# Patient Record
Sex: Male | Born: 1965 | Race: White | Hispanic: No | Marital: Married | Smoking: Current every day smoker
Health system: Southern US, Community
[De-identification: ages and names within clinical notes are randomized; demographics above are authoritative.]

## PROBLEM LIST (undated history)

## (undated) DIAGNOSIS — S82409A Unspecified fracture of shaft of unspecified fibula, initial encounter for closed fracture: Secondary | ICD-10-CM

## (undated) DIAGNOSIS — E785 Hyperlipidemia, unspecified: Secondary | ICD-10-CM

## (undated) DIAGNOSIS — F17200 Nicotine dependence, unspecified, uncomplicated: Secondary | ICD-10-CM

## (undated) DIAGNOSIS — F1011 Alcohol abuse, in remission: Secondary | ICD-10-CM

## (undated) DIAGNOSIS — F32A Depression, unspecified: Secondary | ICD-10-CM

## (undated) DIAGNOSIS — F329 Major depressive disorder, single episode, unspecified: Secondary | ICD-10-CM

## (undated) DIAGNOSIS — F419 Anxiety disorder, unspecified: Secondary | ICD-10-CM

## (undated) DIAGNOSIS — F1910 Other psychoactive substance abuse, uncomplicated: Secondary | ICD-10-CM

## (undated) DIAGNOSIS — T7840XA Allergy, unspecified, initial encounter: Secondary | ICD-10-CM

## (undated) DIAGNOSIS — I10 Essential (primary) hypertension: Secondary | ICD-10-CM

## (undated) DIAGNOSIS — Z8619 Personal history of other infectious and parasitic diseases: Secondary | ICD-10-CM

## (undated) DIAGNOSIS — J449 Chronic obstructive pulmonary disease, unspecified: Secondary | ICD-10-CM

## (undated) DIAGNOSIS — S82209A Unspecified fracture of shaft of unspecified tibia, initial encounter for closed fracture: Secondary | ICD-10-CM

## (undated) DIAGNOSIS — Z889 Allergy status to unspecified drugs, medicaments and biological substances status: Secondary | ICD-10-CM

## (undated) HISTORY — DX: Anxiety disorder, unspecified: F41.9

## (undated) HISTORY — DX: Other psychoactive substance abuse, uncomplicated: F19.10

## (undated) HISTORY — DX: Allergy status to unspecified drugs, medicaments and biological substances: Z88.9

## (undated) HISTORY — DX: Nicotine dependence, unspecified, uncomplicated: F17.200

## (undated) HISTORY — DX: Personal history of other infectious and parasitic diseases: Z86.19

## (undated) HISTORY — DX: Allergy, unspecified, initial encounter: T78.40XA

## (undated) HISTORY — DX: Essential (primary) hypertension: I10

## (undated) HISTORY — DX: Depression, unspecified: F32.A

## (undated) HISTORY — DX: Alcohol abuse, in remission: F10.11

## (undated) HISTORY — DX: Major depressive disorder, single episode, unspecified: F32.9

## (undated) HISTORY — DX: Unspecified fracture of shaft of unspecified tibia, initial encounter for closed fracture: S82.209A

## (undated) HISTORY — DX: Hyperlipidemia, unspecified: E78.5

## (undated) HISTORY — DX: Unspecified fracture of shaft of unspecified fibula, initial encounter for closed fracture: S82.409A

---
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
## 2012-12-04 ENCOUNTER — Ambulatory Visit: Payer: Self-pay

## 2012-12-04 ENCOUNTER — Other Ambulatory Visit: Payer: Self-pay | Admitting: Occupational Medicine

## 2012-12-04 DIAGNOSIS — Z0000 Encounter for general adult medical examination without abnormal findings: Secondary | ICD-10-CM

## 2013-08-07 ENCOUNTER — Ambulatory Visit: Payer: Self-pay | Admitting: Family Medicine

## 2013-09-21 ENCOUNTER — Ambulatory Visit (INDEPENDENT_AMBULATORY_CARE_PROVIDER_SITE_OTHER): Payer: BC Managed Care – PPO | Admitting: Family Medicine

## 2013-09-21 ENCOUNTER — Encounter: Payer: Self-pay | Admitting: Family Medicine

## 2013-09-21 ENCOUNTER — Encounter (INDEPENDENT_AMBULATORY_CARE_PROVIDER_SITE_OTHER): Payer: Self-pay

## 2013-09-21 VITALS — BP 136/92 | HR 120 | Temp 98.2°F | Ht 71.0 in | Wt 215.5 lb

## 2013-09-21 DIAGNOSIS — F32A Depression, unspecified: Secondary | ICD-10-CM | POA: Insufficient documentation

## 2013-09-21 DIAGNOSIS — F1090 Alcohol use, unspecified, uncomplicated: Secondary | ICD-10-CM

## 2013-09-21 DIAGNOSIS — Z7289 Other problems related to lifestyle: Secondary | ICD-10-CM

## 2013-09-21 DIAGNOSIS — F1010 Alcohol abuse, uncomplicated: Secondary | ICD-10-CM

## 2013-09-21 DIAGNOSIS — F3289 Other specified depressive episodes: Secondary | ICD-10-CM

## 2013-09-21 DIAGNOSIS — E785 Hyperlipidemia, unspecified: Secondary | ICD-10-CM | POA: Insufficient documentation

## 2013-09-21 DIAGNOSIS — F17200 Nicotine dependence, unspecified, uncomplicated: Secondary | ICD-10-CM

## 2013-09-21 DIAGNOSIS — F329 Major depressive disorder, single episode, unspecified: Secondary | ICD-10-CM

## 2013-09-21 DIAGNOSIS — I10 Essential (primary) hypertension: Secondary | ICD-10-CM | POA: Insufficient documentation

## 2013-09-21 DIAGNOSIS — F4323 Adjustment disorder with mixed anxiety and depressed mood: Secondary | ICD-10-CM | POA: Insufficient documentation

## 2013-09-21 DIAGNOSIS — F1011 Alcohol abuse, in remission: Secondary | ICD-10-CM | POA: Insufficient documentation

## 2013-09-21 MED ORDER — LOSARTAN POTASSIUM 50 MG PO TABS: 50.0000 mg | ORAL_TABLET | Freq: Every day | ORAL | Status: DC

## 2013-09-21 MED ORDER — BUPROPION HCL ER (SR) 150 MG PO TB12: 150.0000 mg | ORAL_TABLET | Freq: Every day | ORAL | Status: DC

## 2013-09-21 NOTE — Assessment & Plan Note (Signed)
Chronic. Ran out of losartan 1 mo ago - will refill today. May need B blocker given baseline tachycardia. Will need basic labwork including TSH if not recently done at [facility] - will await records.

## 2013-09-21 NOTE — Assessment & Plan Note (Signed)
Feels paxil not as effective anymore, desires transition to wellbutrin. Discussed paxil taper, will start wellbutrin 150mg  SL once daily. No h/o seizures.

## 2013-09-21 NOTE — Progress Notes (Signed)
BP 136/92  Pulse 120  Temp(Src) 98.2 F (36.8 C) (Oral)  Ht 5\' 11"  (1.803 m)  Wt 215 lb 8 oz (97.75 kg)  BMI 30.07 kg/m2   CC: new pt to establish care  Subjective:    Patient ID: Daniel May, male    DOB: 03-28-1965, 48 y.o.   MRN: [id number]  HPI: Daniel May is a 48 y.o. male presenting on 09/21/2013 for [place] from [facility] Nov 2014. Prior saw Dr Ladoris Gene.  Smoking - working on quitting smoking. 1 ppd x 30 yrs.  Alcohol abuse - admits he's alcoholic. Still drinks - about 3 drinks/day. Much less than prior.  Drinks 2-3 drinks in 1 sitting. Prior to this 6 pack and 1 pint vodka daily. + hangover but no withdrawals, DT or seizures in past.   H/o depression - longterm on paxil, doesn't effective anymore. Wonders about wellbutrin. No significant anxiety such as excess worry. Feels has trouble shutting off mind. No SI/HI.  HTN - prior on losartan 50mg  daily. Ran out 1 mo ago.  No HA, vision changes, CP/tightness, SOB, leg swelling. States HR runs elevated.  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  Allergies and medications reviewed and updated. No current outpatient prescriptions on file prior to visit.   No current facility-administered medications on file prior to visit.    Review of Systems Per HPI unless specifically indicated above    Objective:    BP 136/92  Pulse 120  Temp(Src) 98.2 F (36.8 C) (Oral)  Ht 5\' 11"  (1.803 m)  Wt 215 lb 8 oz (97.75 kg)  BMI 30.07 kg/m2  Physical Exam  Nursing note and vitals reviewed. Constitutional: He appears well-developed and well-nourished. No distress.  HENT:  Mouth/Throat: Oropharynx is clear and moist. No  oropharyngeal exudate.  Eyes: Conjunctivae and EOM are normal. Pupils are equal, round, and reactive to light. No scleral icterus.  Neck: Normal range of motion. Neck supple. No thyromegaly present.  Cardiovascular: Regular rhythm, normal heart sounds and intact distal pulses.  Tachycardia present.   No murmur heard. Pulmonary/Chest: Effort normal and breath sounds normal. No respiratory distress. He has no wheezes. He has no rales.  Musculoskeletal: He exhibits no edema.  Lymphadenopathy:    He has no cervical adenopathy.  Skin: Skin is warm and dry. No rash noted.  Psychiatric: He has a normal mood and affect.   No results found for this or any previous visit.    Assessment & Plan:   Problem List Items Addressed This Visit   Smoker     1 PPD, 30 PY hx. Desires to quit with wellbutrin. Start wellbutrin 150mg  SL once daily. rtc 2-3 mo for f/u.    HTN (hypertension)     Chronic. Ran out of losartan 1 mo ago - will refill today. May need B blocker given baseline tachycardia. Will need basic labwork including TSH if not recently done at [facility] - will await records.    Relevant Medications      losartan (COZAAR) tablet   HLD (hyperlipidemia)     Chronic per patient - await records on recent labwork from [facility] in [place].    Relevant Medications      losartan (  COZAAR) tablet   Habitual alcohol use - Primary     Continue to monitor. Pt states he limits EtOH intake to 3 beer or wine glasses per day. Denies h/o EtOH withdrawal or DTs.    Depression     Feels paxil not as effective anymore, desires transition to wellbutrin. Discussed paxil taper, will start wellbutrin 150mg  SL once daily. No h/o seizures.    Relevant Medications      PARoxetine (PAXIL) 10 MG tablet      buPROPion (WELLBUTRIN SR) 12 hr tablet       Follow up plan: Return in about 3 months (around 12/21/2013), or as needed, for follow up visit.

## 2013-09-21 NOTE — Assessment & Plan Note (Signed)
Chronic per patient - await records on recent labwork from [facility] in [place].

## 2013-09-21 NOTE — Progress Notes (Signed)
Pre visit review using our clinic review tool, if applicable. No additional management support is needed unless otherwise documented below in the visit note. 

## 2013-09-21 NOTE — Assessment & Plan Note (Signed)
1 PPD, 30 PY hx. Desires to quit with wellbutrin. Start wellbutrin 150mg  SL once daily. rtc 2-3 mo for f/u.

## 2013-09-21 NOTE — Patient Instructions (Signed)
I've refilled losartan. Let's decrease paxil to 5mg  daily (1/2 tab) for 2 weeks then stop. May also start wellbutrin 150mg  SR once daily. Return in 2-3 months for rehceck blood pressure and see how wellbutrin is working. I will await records from walk in clinic and if needed draw bloodwork at next visit

## 2013-09-21 NOTE — Assessment & Plan Note (Signed)
Continue to monitor. Pt states he limits EtOH intake to 3 beer or wine glasses per day. Denies h/o EtOH withdrawal or DTs.

## 2013-11-18 ENCOUNTER — Ambulatory Visit: Payer: BC Managed Care – PPO | Admitting: Family Medicine

## 2013-11-20 ENCOUNTER — Ambulatory Visit: Payer: BC Managed Care – PPO | Admitting: Family Medicine

## 2013-11-27 ENCOUNTER — Ambulatory Visit: Payer: BC Managed Care – PPO | Admitting: Family Medicine

## 2013-11-27 DIAGNOSIS — Z0289 Encounter for other administrative examinations: Secondary | ICD-10-CM

## 2013-12-04 ENCOUNTER — Ambulatory Visit (INDEPENDENT_AMBULATORY_CARE_PROVIDER_SITE_OTHER): Payer: BC Managed Care – PPO | Admitting: Family Medicine

## 2013-12-04 ENCOUNTER — Ambulatory Visit: Payer: BC Managed Care – PPO | Admitting: Family Medicine

## 2013-12-04 ENCOUNTER — Encounter: Payer: Self-pay | Admitting: Family Medicine

## 2013-12-04 VITALS — BP 128/90 | HR 104 | Temp 98.1°F | Wt 214.5 lb

## 2013-12-04 DIAGNOSIS — F32A Depression, unspecified: Secondary | ICD-10-CM

## 2013-12-04 DIAGNOSIS — I10 Essential (primary) hypertension: Secondary | ICD-10-CM

## 2013-12-04 DIAGNOSIS — J209 Acute bronchitis, unspecified: Secondary | ICD-10-CM | POA: Insufficient documentation

## 2013-12-04 DIAGNOSIS — Z720 Tobacco use: Secondary | ICD-10-CM

## 2013-12-04 DIAGNOSIS — F17200 Nicotine dependence, unspecified, uncomplicated: Secondary | ICD-10-CM

## 2013-12-04 DIAGNOSIS — F329 Major depressive disorder, single episode, unspecified: Secondary | ICD-10-CM

## 2013-12-04 MED ORDER — CITALOPRAM HYDROBROMIDE 10 MG PO TABS: 10.0000 mg | ORAL_TABLET | Freq: Every day | ORAL | Status: DC

## 2013-12-04 MED ORDER — AZITHROMYCIN 250 MG PO TABS: ORAL_TABLET | ORAL | Status: DC

## 2013-12-04 NOTE — Assessment & Plan Note (Signed)
Chronic, improved on losartan. Continue 50mg  daily.

## 2013-12-04 NOTE — Progress Notes (Signed)
BP 128/90 mmHg  Pulse 104  Temp(Src) 98.1 F (36.7 C) (Oral)  Wt 214 lb 8 oz (97.297 kg)   CC: 75mo f/u visit  Subjective:    Patient ID: Daniel May, male    DOB: [birth year], 48 y.o.   MRN: [id number]  HPI: Daniel May is a 48 y.o. male presenting on 12/04/2013 for Follow-up   Smoking - prior 1 ppd, we started wellbutrin 150mg  SL last visit. Down to 1/2 ppd. wellbutrin hasn't really helped.   HTN - Compliant with current antihypertensive regimen of losartan 50mg .  Does not check blood pressures at home.  No low blood pressure symptoms of dizziness/syncope.  Denies HA, vision changes, CP/tightness, SOB, leg swelling.   HLD - never received labs from local [place]. No results found for: CHOL, HDL, LDLCALC, LDLDIRECT, TRIG, CHOLHDL   Alcohol use - limited to 3 beer/wine per day.  Depression - paxil became less effective so last visit we transitioned to wellbutrin. Not working as well as paxil. Wife notices more trouble with irritability.   Ongoing cough for last month with persistent nasal congestion and chest congestion. So far has tried mucinex OTC, robitussin. Persistent dry coughing. No fevers, dyspnea. Mild wheeze. No h/o asthma. No h/o COPD.  Relevant past medical, surgical, family and social history reviewed and updated as indicated.  Allergies and medications reviewed and updated. Current Outpatient Prescriptions on File Prior to Visit  Medication Sig  . losartan (COZAAR) 50 MG tablet Take 1 tablet (50 mg total) by mouth daily.   No current facility-administered medications on file prior to visit.    Review of Systems Per HPI unless specifically indicated above    Objective:    BP 128/90 mmHg  Pulse 104  Temp(Src) 98.1 F (36.7 C) (Oral)  Wt 214 lb 8 oz (97.297 kg)  Physical Exam  Constitutional: He appears well-developed and well-nourished. No distress.  HENT:  Head: Normocephalic and atraumatic.  Right Ear: Hearing, tympanic membrane, external ear and ear canal  normal.  Left Ear: Hearing, tympanic membrane, external ear and ear canal normal.  Nose: Nose normal. No mucosal edema or rhinorrhea. Right sinus exhibits no maxillary sinus tenderness and no frontal sinus tenderness. Left sinus exhibits no maxillary sinus tenderness and no frontal sinus tenderness.  Mouth/Throat: Uvula is midline, oropharynx is clear and moist and mucous membranes are normal. No oropharyngeal exudate, posterior oropharyngeal edema, posterior oropharyngeal erythema or tonsillar abscesses.  Eyes: Conjunctivae and EOM are normal. Pupils are equal, round, and reactive to light. No scleral icterus.  Neck: Normal range of motion. Neck supple.  Cardiovascular: Normal rate, regular rhythm, normal heart sounds and intact distal pulses.   No murmur heard. Pulmonary/Chest: Effort normal. No respiratory distress. He has wheezes (faint in L lung). He has no rales.  Lymphadenopathy:    He has no cervical adenopathy.  Skin: Skin is warm and dry. No rash noted.  Nursing note and vitals reviewed.  No results found for this or any previous visit.    Assessment & Plan:   Problem List Items Addressed This Visit    Smoker    Down to 1/2 ppd. Encouraged continued cessation effort. Wellbutrin ineffective. May consider chantix in future.    HTN (hypertension)    Chronic, improved on losartan. Continue 50mg  daily.    Depression - Primary    Did not respond to wellbutrin. Will transition to celexa 10mg  daily. If not better with this, would return to paxil (somewhat effective in past) Pt agrees  with plan.    Relevant Medications      citalopram (CELEXA) tablet   Acute bronchitis    In smoker. rec mucinex with plenty of water If not better with this, may fill zpack antibiotic provided today.        Follow up plan: Return if symptoms worsen or fail to improve.

## 2013-12-04 NOTE — Progress Notes (Signed)
Pre visit review using our clinic review tool, if applicable. No additional management support is needed unless otherwise documented below in the visit note. 

## 2013-12-04 NOTE — Assessment & Plan Note (Signed)
In smoker. rec mucinex with plenty of water If not better with this, may fill zpack antibiotic provided today.

## 2013-12-04 NOTE — Assessment & Plan Note (Signed)
Did not respond to wellbutrin. Will transition to celexa 10mg  daily. If not better with this, would return to paxil (somewhat effective in past) Pt agrees with plan.

## 2013-12-04 NOTE — Assessment & Plan Note (Signed)
Down to 1/2 ppd. Encouraged continued cessation effort. Wellbutrin ineffective. May consider chantix in future.

## 2013-12-04 NOTE — Patient Instructions (Addendum)
Keep working towards quitting smoking Let's transition off wellbutrin and onto celexa 10mg  daily. Start celexa, continue wellbutrin then 1 week after starting celexa may stop wellbutrin. If celexa not helpful after 1 month let me know we can transition to paxil. For cough - continue plain mucinex with plenty of water to help mobilize mucous. If not better with this, fill zpack antibiotic (prescription printed today).

## 2014-04-07 ENCOUNTER — Other Ambulatory Visit: Payer: Self-pay

## 2014-04-07 MED ORDER — CITALOPRAM HYDROBROMIDE 10 MG PO TABS: 10.0000 mg | ORAL_TABLET | Freq: Every day | ORAL | Status: DC

## 2014-04-07 NOTE — Telephone Encounter (Signed)
Pt left v/m requesting refill citalopram to [facility] [address]. Left vm advising refill sent to [facility] [address] as requested,.

## 2014-07-21 ENCOUNTER — Other Ambulatory Visit: Payer: Self-pay | Admitting: Family Medicine

## 2014-07-21 ENCOUNTER — Other Ambulatory Visit (INDEPENDENT_AMBULATORY_CARE_PROVIDER_SITE_OTHER): Payer: BLUE CROSS/BLUE SHIELD

## 2014-07-21 DIAGNOSIS — I10 Essential (primary) hypertension: Secondary | ICD-10-CM

## 2014-07-21 DIAGNOSIS — E785 Hyperlipidemia, unspecified: Secondary | ICD-10-CM | POA: Diagnosis not present

## 2014-07-21 DIAGNOSIS — F1090 Alcohol use, unspecified, uncomplicated: Secondary | ICD-10-CM

## 2014-07-21 DIAGNOSIS — Z7289 Other problems related to lifestyle: Secondary | ICD-10-CM

## 2014-07-21 LAB — COMPREHENSIVE METABOLIC PANEL
ALK PHOS: 80 U/L (ref 39–117)
ALT: 78 U/L — ABNORMAL HIGH (ref 0–53)
AST: 72 U/L — ABNORMAL HIGH (ref 0–37)
Albumin: 4.3 g/dL (ref 3.5–5.2)
BUN: 8 mg/dL (ref 6–23)
CHLORIDE: 100 meq/L (ref 96–112)
CO2: 31 meq/L (ref 19–32)
CREATININE: 1.13 mg/dL (ref 0.40–1.50)
Calcium: 9.7 mg/dL (ref 8.4–10.5)
GFR: 73.79 mL/min (ref >60.00)
Glucose, Bld: 117 mg/dL — ABNORMAL HIGH (ref 70–99)
Potassium: 4.6 mEq/L (ref 3.5–5.1)
Sodium: 139 mEq/L (ref 135–145)
Total Bilirubin: 0.4 mg/dL (ref 0.2–1.2)
Total Protein: 7.4 g/dL (ref 6.0–8.3)

## 2014-07-21 LAB — LIPID PANEL
Cholesterol: 185 mg/dL (ref 0–200)
HDL: 42.7 mg/dL (ref >39.00)
NonHDL: 142.3
Total CHOL/HDL Ratio: 4
Triglycerides: 306 mg/dL — ABNORMAL HIGH (ref 0.0–149.0)
VLDL: 61.2 mg/dL — ABNORMAL HIGH (ref 0.0–40.0)

## 2014-07-21 LAB — LDL CHOLESTEROL, DIRECT: Direct LDL: 96 mg/dL

## 2014-07-21 LAB — TSH: TSH: 3.18 u[IU]/mL (ref 0.35–4.50)

## 2014-07-22 ENCOUNTER — Other Ambulatory Visit: Payer: Self-pay

## 2014-07-28 ENCOUNTER — Ambulatory Visit (INDEPENDENT_AMBULATORY_CARE_PROVIDER_SITE_OTHER): Payer: BLUE CROSS/BLUE SHIELD | Admitting: Family Medicine

## 2014-07-28 ENCOUNTER — Encounter: Payer: Self-pay | Admitting: Family Medicine

## 2014-07-28 ENCOUNTER — Ambulatory Visit (INDEPENDENT_AMBULATORY_CARE_PROVIDER_SITE_OTHER)
Admission: RE | Admit: 2014-07-28 | Discharge: 2014-07-28 | Disposition: A | Discharge status: Home / Self Care | Payer: BLUE CROSS/BLUE SHIELD | Source: Ambulatory Visit | Attending: Family Medicine | Admitting: Family Medicine

## 2014-07-28 VITALS — BP 136/96 | HR 100 | Temp 98.2°F | Ht 71.0 in | Wt 224.5 lb

## 2014-07-28 DIAGNOSIS — Z720 Tobacco use: Secondary | ICD-10-CM | POA: Diagnosis not present

## 2014-07-28 DIAGNOSIS — R740 Nonspecific elevation of levels of transaminase and lactic acid dehydrogenase [LDH]: Secondary | ICD-10-CM

## 2014-07-28 DIAGNOSIS — F1090 Alcohol use, unspecified, uncomplicated: Secondary | ICD-10-CM

## 2014-07-28 DIAGNOSIS — F17200 Nicotine dependence, unspecified, uncomplicated: Secondary | ICD-10-CM

## 2014-07-28 DIAGNOSIS — F329 Major depressive disorder, single episode, unspecified: Secondary | ICD-10-CM

## 2014-07-28 DIAGNOSIS — R591 Generalized enlarged lymph nodes: Secondary | ICD-10-CM

## 2014-07-28 DIAGNOSIS — Z801 Family history of malignant neoplasm of trachea, bronchus and lung: Secondary | ICD-10-CM

## 2014-07-28 DIAGNOSIS — Z0000 Encounter for general adult medical examination without abnormal findings: Secondary | ICD-10-CM

## 2014-07-28 DIAGNOSIS — Z7289 Other problems related to lifestyle: Secondary | ICD-10-CM

## 2014-07-28 DIAGNOSIS — F1010 Alcohol abuse, uncomplicated: Secondary | ICD-10-CM

## 2014-07-28 DIAGNOSIS — E781 Pure hyperglyceridemia: Secondary | ICD-10-CM

## 2014-07-28 DIAGNOSIS — F32A Depression, unspecified: Secondary | ICD-10-CM

## 2014-07-28 DIAGNOSIS — I10 Essential (primary) hypertension: Secondary | ICD-10-CM

## 2014-07-28 DIAGNOSIS — R7401 Elevation of levels of liver transaminase levels: Secondary | ICD-10-CM

## 2014-07-28 MED ORDER — PAROXETINE HCL 20 MG PO TABS: 20.0000 mg | ORAL_TABLET | Freq: Every day | ORAL | Status: DC

## 2014-07-28 NOTE — Assessment & Plan Note (Signed)
With noted transaminitis and hypertriglyceridemia again encouraged complete alcohol cessation.

## 2014-07-28 NOTE — Assessment & Plan Note (Signed)
Anticipate alcohol related - encouraged complete cessation then would recheck. If persistently elevated, would consider medication for this.

## 2014-07-28 NOTE — Assessment & Plan Note (Addendum)
Wellbutrin, celexa not effective - will transition to paxil which was effective previously. Start at paxil 20mg  daily.

## 2014-07-28 NOTE — Assessment & Plan Note (Addendum)
New. Recheck in 1 month along with viral hep panel and iron panel. ?alcohol related. Consider abd [place] if persistently elevated.

## 2014-07-28 NOTE — Assessment & Plan Note (Signed)
Preventative protocols reviewed and updated unless pt declined. Discussed healthy diet and lifestyle.  

## 2014-07-28 NOTE — Patient Instructions (Addendum)
Let's stop celexa and same day start paxil 20mg  daily. Chest xray today.  Return in 1 month to recheck liver.  Return in 2-3 months for follow up visit. Keep an eye on right side neck swelling. See what dentist thinks about it.

## 2014-07-28 NOTE — Assessment & Plan Note (Addendum)
Chronic, again elevated with mild tachycardia. Will reassess next visit (2-3 mo).

## 2014-07-28 NOTE — Progress Notes (Signed)
Pre visit review using our clinic review tool, if applicable. No additional management support is needed unless otherwise documented below in the visit note. 

## 2014-07-28 NOTE — Progress Notes (Addendum)
BP 136/96 mmHg  Pulse 100  Temp(Src) 98.2 F (36.8 C) (Oral)  Ht 5\' 11"  (1.803 m)  Wt 224 lb 8 oz (101.833 kg)  BMI 31.33 kg/m2   CC: CPE  Subjective:    Patient ID: Daniel May, male    DOB: 1965-09-11, 49 y.o.   MRN: [id number]  HPI: Daniel May is a 49 y.o. male presenting on 07/28/2014 for Annual Exam   Smoking was at 1/2 ppd - now 1/2-3/4 ppd. Set quit date. Father with h/o lung cancer at age 69yo.   Alcohol use - limits to 3 drinks per day (beer or wine).   HTN - compliant with losartan 50mg  daily.  Depression - paxil transitioned to wellbutrin transitioned to celexa 10mg  daily. Would like to transition back to paxil. Still irritable and moody, sometimes feels down.   Preventative: Flu shot - doesn't get. Tetanus - unsure Seat belt use discussed Sunscreen use discussed. No changing moles.  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 Interim medical history since our last visit reviewed. Allergies and medications reviewed and updated. Current Outpatient Prescriptions on File Prior to Visit  Medication Sig  . losartan (COZAAR) 50 MG tablet Take 1 tablet (50 mg total) by mouth daily.   No current facility-administered medications on file prior to visit.    Review of Systems  Constitutional: Negative for fever, chills, activity change, appetite change, fatigue and unexpected weight change.  HENT: Negative for hearing loss.   Eyes: Negative for visual disturbance.  Respiratory: Negative for cough, chest tightness, shortness of breath and wheezing.   Cardiovascular: Negative for chest pain, palpitations and leg swelling.  Gastrointestinal: Negative for nausea, vomiting, abdominal pain, diarrhea, constipation, blood in stool and abdominal distention.    Genitourinary: Negative for hematuria and difficulty urinating.  Musculoskeletal: Negative for myalgias, arthralgias and neck pain.  Skin: Negative for rash.  Neurological: Negative for dizziness, seizures, syncope and headaches.  Hematological: Negative for adenopathy. Does not bruise/bleed easily.  Psychiatric/Behavioral: Negative for dysphoric mood. The patient is not nervous/anxious.    Per HPI unless specifically indicated above     Objective:    BP 136/96 mmHg  Pulse 100  Temp(Src) 98.2 F (36.8 C) (Oral)  Ht 5\' 11"  (1.803 m)  Wt 224 lb 8 oz (101.833 kg)  BMI 31.33 kg/m2  Wt Readings from Last 3 Encounters:  07/28/14 224 lb 8 oz (101.833 kg)  12/04/13 214 lb 8 oz (97.297 kg)  09/21/13 215 lb 8 oz (97.75 kg)    Physical Exam  Constitutional: He is oriented to person, place, and time. He appears well-developed and well-nourished. No distress.  HENT:  Head: Normocephalic and atraumatic.  Right Ear: Hearing, tympanic membrane, external ear and ear canal normal.  Left Ear: Hearing, tympanic membrane, external ear and ear canal normal.  Nose: Nose normal.  Mouth/Throat: Uvula is midline, oropharynx is clear and moist and mucous membranes are normal. No oropharyngeal exudate, posterior oropharyngeal edema or posterior oropharyngeal erythema.  Eyes: Conjunctivae and EOM are normal. Pupils are equal, round, and reactive to light. No scleral icterus.  Neck: Normal range of motion. Neck supple. No thyromegaly present.  Cardiovascular: Normal rate, regular rhythm, normal heart sounds and intact distal pulses.   No murmur heard. Pulses:      Radial pulses  are 2+ on the right side, and 2+ on the left side.  Pulmonary/Chest: Effort normal and breath sounds normal. No respiratory distress. He has no wheezes. He has no rales.  Abdominal: Soft. Bowel sounds are normal. He exhibits no distension and no mass. There is no tenderness. There is no rebound and no guarding.  Musculoskeletal:  Normal range of motion. He exhibits no edema.  Lymphadenopathy:       Head (right side): No submental, no submandibular, no tonsillar, no preauricular and no posterior auricular adenopathy present.       Head (left side): No submental, no submandibular, no tonsillar, no preauricular and no posterior auricular adenopathy present.    He has cervical adenopathy.       Right cervical: Posterior cervical (~1.5cm firm nontender) adenopathy present. No superficial cervical adenopathy present.      Left cervical: No superficial cervical and no posterior cervical adenopathy present.       Right: No supraclavicular adenopathy present.       Left: No supraclavicular adenopathy present.  Neurological: He is alert and oriented to person, place, and time.  CN grossly intact, station and gait intact  Skin: Skin is warm and dry. No rash noted.  Psychiatric: He has a normal mood and affect. His behavior is normal. Judgment and thought content normal.  Nursing note and vitals reviewed.  Results for orders placed or performed in visit on 07/21/14  TSH  Result Value Ref Range   TSH 3.18 0.35 - 4.50 uIU/mL  Lipid panel  Result Value Ref Range   Cholesterol 185 0 - 200 mg/dL   Triglycerides 306.0 (H) 0.0 - 149.0 mg/dL   HDL 42.70 >39.00 mg/dL   VLDL 61.2 (H) 0.0 - 40.0 mg/dL   Total CHOL/HDL Ratio 4    NonHDL 142.30   Comprehensive metabolic panel  Result Value Ref Range   Sodium 139 135 - 145 mEq/L   Potassium 4.6 3.5 - 5.1 mEq/L   Chloride 100 96 - 112 mEq/L   CO2 31 19 - 32 mEq/L   Glucose, Bld 117 (H) 70 - 99 mg/dL   BUN 8 6 - 23 mg/dL   Creatinine, Ser 1.13 0.40 - 1.50 mg/dL   Total Bilirubin 0.4 0.2 - 1.2 mg/dL   Alkaline Phosphatase 80 39 - 117 U/L   AST 72 (H) 0 - 37 U/L   ALT 78 (H) 0 - 53 U/L   Total Protein 7.4 6.0 - 8.3 g/dL   Albumin 4.3 3.5 - 5.2 g/dL   Calcium 9.7 8.4 - 10.5 mg/dL   GFR 73.79 >60.00 mL/min  LDL cholesterol, direct  Result Value Ref Range   Direct LDL 96.0 mg/dL       Assessment & Plan:   Problem List Items Addressed This Visit    Depression    Wellbutrin, celexa not effective - will transition to paxil which was effective previously. Start at paxil 20mg  daily.      Relevant Medications   PARoxetine (PAXIL) 20 MG tablet   Habitual alcohol use    With noted transaminitis and hypertriglyceridemia again encouraged complete alcohol cessation.       Head and neck lymphadenopathy    Poor dentition noted - ?cause. Pt has upcoming dental appointment - advised to check with dentsit re LN. Will reassess at f/u visit in 2-3 mo. CXR today.      Relevant Orders   CBC with Differential/Platelet   Health maintenance examination - Primary    Preventative  protocols reviewed and updated unless pt declined. Discussed healthy diet and lifestyle.       HTN (hypertension)    Chronic, again elevated with mild tachycardia. Will reassess next visit (2-3 mo).      Hypertriglyceridemia    Anticipate alcohol related - encouraged complete cessation then would recheck. If persistently elevated, would consider medication for this.      Smoker    Smoking has increased some. Encouraged continued attempts at cessation. wellbutrin ineffective previously. In smoker and fmhx lung cancer at early age, will check CXR today.      Relevant Orders   DG Chest 2 View   Transaminitis    New. Recheck in 1 month along with viral hep panel and iron panel. ?alcohol related. Consider abd [place] if persistently elevated.      Relevant Orders   Hepatic Function Panel   IBC panel   Hepatitis panel, acute    Other Visit Diagnoses    Family history of lung cancer        Relevant Orders    DG Chest 2 View        Follow up plan: Return in about 3 months (around 10/28/2014), or as needed, for follow up visit.

## 2014-07-28 NOTE — Assessment & Plan Note (Signed)
Poor dentition noted - ?cause. Pt has upcoming dental appointment - advised to check with dentsit re LN. Will reassess at f/u visit in 2-3 mo. CXR today.

## 2014-07-28 NOTE — Addendum Note (Signed)
Addended by: Ria Bush on: 07/28/2014 12:36 PM   Modules accepted: Orders, SmartSet

## 2014-07-28 NOTE — Assessment & Plan Note (Addendum)
Smoking has increased some. Encouraged continued attempts at cessation. wellbutrin ineffective previously. In smoker and fmhx lung cancer at early age, will check CXR today.

## 2014-09-01 ENCOUNTER — Other Ambulatory Visit: Payer: Self-pay | Admitting: Family Medicine

## 2014-09-10 ENCOUNTER — Telehealth: Payer: Self-pay

## 2014-09-10 ENCOUNTER — Other Ambulatory Visit (INDEPENDENT_AMBULATORY_CARE_PROVIDER_SITE_OTHER): Payer: BLUE CROSS/BLUE SHIELD

## 2014-09-10 DIAGNOSIS — R740 Nonspecific elevation of levels of transaminase and lactic acid dehydrogenase [LDH]: Secondary | ICD-10-CM | POA: Diagnosis not present

## 2014-09-10 DIAGNOSIS — R599 Enlarged lymph nodes, unspecified: Secondary | ICD-10-CM

## 2014-09-10 DIAGNOSIS — M25472 Effusion, left ankle: Secondary | ICD-10-CM

## 2014-09-10 DIAGNOSIS — R7401 Elevation of levels of liver transaminase levels: Secondary | ICD-10-CM

## 2014-09-10 DIAGNOSIS — R591 Generalized enlarged lymph nodes: Secondary | ICD-10-CM

## 2014-09-10 LAB — HEPATIC FUNCTION PANEL
ALT: 21 U/L (ref 0–53)
AST: 21 U/L (ref 0–37)
Albumin: 4.1 g/dL (ref 3.5–5.2)
Alkaline Phosphatase: 63 U/L (ref 39–117)
BILIRUBIN TOTAL: 0.4 mg/dL (ref 0.2–1.2)
Bilirubin, Direct: 0.1 mg/dL (ref 0.0–0.3)
Total Protein: 7.1 g/dL (ref 6.0–8.3)

## 2014-09-10 LAB — CBC WITH DIFFERENTIAL/PLATELET
BASOS PCT: 0.6 % (ref 0.0–3.0)
Basophils Absolute: 0.1 10*3/uL (ref 0.0–0.1)
Eosinophils Absolute: 0.3 10*3/uL (ref 0.0–0.7)
Eosinophils Relative: 3.5 % (ref 0.0–5.0)
HCT: 46.7 % (ref 39.0–52.0)
Hemoglobin: 15.5 g/dL (ref 13.0–17.0)
LYMPHS ABS: 2.1 10*3/uL (ref 0.7–4.0)
Lymphocytes Relative: 24.6 % (ref 12.0–46.0)
MCHC: 33.3 g/dL (ref 30.0–36.0)
MCV: 93.2 fl (ref 78.0–100.0)
MONO ABS: 0.9 10*3/uL (ref 0.1–1.0)
Monocytes Relative: 10.2 % (ref 3.0–12.0)
NEUTROS PCT: 61.1 % (ref 43.0–77.0)
Neutro Abs: 5.3 10*3/uL (ref 1.4–7.7)
PLATELETS: 251 10*3/uL (ref 150.0–400.0)
RBC: 5.01 Mil/uL (ref 4.22–5.81)
RDW: 13.4 % (ref 11.5–15.5)
WBC: 8.7 10*3/uL (ref 4.0–10.5)

## 2014-09-10 LAB — IBC PANEL
IRON: 72 ug/dL (ref 42–165)
Saturation Ratios: 23 % (ref 20.0–50.0)
Transferrin: 224 mg/dL (ref 212.0–360.0)

## 2014-09-10 NOTE — Telephone Encounter (Signed)
Pt came in this morning to have labs drawn. Pt states he has had left ankle swelling and pain. Also noticed area is warm to the touch. Pt stated he had gout in the past and wanted to know if you would Uric acid to the orders today. I did draw for the test if you authorize--please advise

## 2014-09-10 NOTE — Telephone Encounter (Signed)
Ok to add - plz order. Thanks.

## 2014-09-10 NOTE — Telephone Encounter (Signed)
Uric acid has been ordered

## 2014-09-10 NOTE — Addendum Note (Signed)
Addended by: Lurlean Nanny on: 09/10/2014 02:21 PM   Modules accepted: Orders

## 2014-09-11 LAB — HEPATITIS PANEL, ACUTE
HCV Ab: NEGATIVE
Hep A IgM: NONREACTIVE
Hep B C IgM: NONREACTIVE
Hepatitis B Surface Ag: NEGATIVE

## 2014-09-13 ENCOUNTER — Other Ambulatory Visit (INDEPENDENT_AMBULATORY_CARE_PROVIDER_SITE_OTHER): Payer: BLUE CROSS/BLUE SHIELD

## 2014-09-13 DIAGNOSIS — M25472 Effusion, left ankle: Secondary | ICD-10-CM | POA: Diagnosis not present

## 2014-09-13 LAB — URIC ACID: URIC ACID, SERUM: 8.1 mg/dL — AB (ref 4.0–7.8)

## 2014-11-18 ENCOUNTER — Telehealth: Payer: Self-pay | Admitting: *Deleted

## 2014-11-18 NOTE — Telephone Encounter (Signed)
Spoke with patient and he declined evaluation. Nurse visit scheduled for tomorrow as that was the only time he could come in.

## 2014-11-18 NOTE — Telephone Encounter (Signed)
Agree with coming in for Tdap as we dont' have documented one. Does he want hand evaluated as well? If so may place at 1:30pm.

## 2014-11-18 NOTE — Telephone Encounter (Signed)
Pt left voicemail at Triage, pt said he cut his finger pretty bad yesterday and he isn't sure when his last Tdap/Td was. Pt is requesting to come to office this afternoon to get a Tdap or Td vaccine

## 2014-11-19 ENCOUNTER — Ambulatory Visit (INDEPENDENT_AMBULATORY_CARE_PROVIDER_SITE_OTHER): Payer: BLUE CROSS/BLUE SHIELD | Admitting: *Deleted

## 2014-11-19 DIAGNOSIS — Z23 Encounter for immunization: Secondary | ICD-10-CM | POA: Diagnosis not present

## 2015-01-20 ENCOUNTER — Emergency Department: Payer: BLUE CROSS/BLUE SHIELD

## 2015-01-20 ENCOUNTER — Emergency Department
Admission: EM | Admit: 2015-01-20 | Discharge: 2015-01-20 | Disposition: A | Discharge status: Home / Self Care | Payer: BLUE CROSS/BLUE SHIELD | Attending: Emergency Medicine | Admitting: Emergency Medicine

## 2015-01-20 DIAGNOSIS — Y9389 Activity, other specified: Secondary | ICD-10-CM | POA: Insufficient documentation

## 2015-01-20 DIAGNOSIS — Y998 Other external cause status: Secondary | ICD-10-CM | POA: Insufficient documentation

## 2015-01-20 DIAGNOSIS — I10 Essential (primary) hypertension: Secondary | ICD-10-CM | POA: Insufficient documentation

## 2015-01-20 DIAGNOSIS — F17210 Nicotine dependence, cigarettes, uncomplicated: Secondary | ICD-10-CM | POA: Insufficient documentation

## 2015-01-20 DIAGNOSIS — Y9289 Other specified places as the place of occurrence of the external cause: Secondary | ICD-10-CM | POA: Diagnosis not present

## 2015-01-20 DIAGNOSIS — Z79899 Other long term (current) drug therapy: Secondary | ICD-10-CM | POA: Insufficient documentation

## 2015-01-20 DIAGNOSIS — W25XXXA Contact with sharp glass, initial encounter: Secondary | ICD-10-CM | POA: Insufficient documentation

## 2015-01-20 DIAGNOSIS — S61411A Laceration without foreign body of right hand, initial encounter: Secondary | ICD-10-CM | POA: Diagnosis not present

## 2015-01-20 MED ORDER — LIDOCAINE-EPINEPHRINE 1 %-1:200000 IJ SOLN
INTRAMUSCULAR | Status: AC
Start: 2015-01-20 — End: 2015-01-20
  Administered 2015-01-20: 22:00:00
  Filled 2015-01-20: qty 30

## 2015-01-20 NOTE — ED Provider Notes (Signed)
[facility] Emergency Department Provider Note ?  ? ____________________________________________ ? Time seen: 9:55 PM ? I have reviewed the triage vital signs and the nursing notes.  ________ HISTORY ? Chief Complaint Extremity Laceration     HPI  Daniel May is a 49 y.o. male   who presents emergency Department with a laceration to his right hand. He states that he was drinking a call with glass bottles when one broke and he cut the back of his hand. He is able control bleeding prior to arrival. He endorses full range of motion to wrist and all digits. Patient states that last tetanus shot was within a month. No other complaints or injuries at this time. ? ? ? Past Medical History  Diagnosis Date  . Habitual alcohol use     and h/o abuse  . History of chicken pox   . Depression   . H/O seasonal allergies   . HTN (hypertension)   . HLD (hyperlipidemia)   . Closed fracture of tibia and fibula 1980's    right  . Smoker     Patient Active Problem List   Diagnosis Date Noted  . Health maintenance examination 07/28/2014  . Head and neck lymphadenopathy 07/28/2014  . Transaminitis 07/28/2014  . Habitual alcohol use   . Depression   . HTN (hypertension)   . Hypertriglyceridemia   . Smoker    ? Past Surgical History  Procedure Laterality Date  . No past surgeries    . Fracture repair of right leg fracture     ? Current Outpatient Rx  Name  Route  Sig  Dispense  Refill  . losartan (COZAAR) 50 MG tablet      TAKE ONE TABLET BY MOUTH ONCE DAILY   90 tablet   3   . PARoxetine (PAXIL) 20 MG tablet   Oral   Take 1 tablet (20 mg total) by mouth daily.   90 tablet   3    ? Allergies Review of patient's allergies indicates no known allergies. ? Family History  Problem Relation Age of Onset  . Cancer Father 58    lung (smoker)  . Diabetes Sister   . Diabetes Brother   . Diabetes Paternal Aunt   . Heart disease Mother     CHF  .  Stroke Neg Hx   . CAD Neg Hx    ? Social History Social History  Substance Use Topics  . Smoking status: Current Every Day Smoker -- 1.00 packs/day    Types: Cigarettes  . Smokeless tobacco: Never Used  . Alcohol Use: 2.4 oz/week    2 Cans of beer, 2 Glasses of wine per week     Comment: Regular-3 beers/night (used to drink 2 pints/vodka and 6 pack/beer everyday)   ? Review of Systems Constitutional: no fever. Eyes: no discharge ENT: no sore throat. Cardiovascular: no chest pain. Respiratory: no cough. No sob Gastrointestinal: denies abdominal pain, vomiting, diarrhea, and constipation Genitourinary: no dysuria. Negative for hematuria Musculoskeletal: Negative for back pain. Skin: Negative for rash. Endorses laceration to right hand. Neurological: Negative for headaches  10-point ROS otherwise negative.  _______________ PHYSICAL EXAM: ? VITAL SIGNS:   ED Triage Vitals  Enc Vitals Group     BP 01/20/15 2006 129/90 mmHg     Pulse Rate 01/20/15 2006 97     Resp 01/20/15 2006 112     Temp 01/20/15 2006 97.8 F (36.6 C)     Temp Source 01/20/15 2006 Oral  SpO2 01/20/15 2006 95 %     Weight 01/20/15 2006 225 lb (102.059 kg)     Height 01/20/15 2006 5\' 10"  (1.778 m)     Head Cir --      Peak Flow --      Pain Score 01/20/15 2008 0     Pain Loc --      Pain Edu? --      Excl. in [place]? --    ?  Constitutional: Alert and oriented. Well appearing and in no distress. Eyes: Conjunctivae are normal.  ENT      Head: Normocephalic and atraumatic.      Ears:       Nose: No congestion/rhinnorhea.      Mouth/Throat: Mucous membranes are moist.       Hematological/Lymphatic/Immunilogical: No cervical lymphadenopathy. Cardiovascular: Normal rate, regular rhythm.  Respiratory: Normal respiratory effort without tachypnea nor retractions. Gastrointestinal: Soft and nontender. No distention. There is no CVA tenderness. Genitourinary:  Musculoskeletal: Nontender with normal  range of motion in all extremities.  Neurologic:  Normal speech and language. No gross focal neurologic deficits are appreciated. Skin:  Skin is warm, dry and intact. No rash noted. laceration noted to the dorsal aspect of right hand. Laceration is over the proximal hand. No tendon involvement. Full range of motion of wrist and all digits. Capillary refill and sensation intact 5 digits. Laceration edges are smooth. No visible foreign body. Total length is 3 cm.  Psychiatric: Mood and affect are normal. Speech and behavior are normal. Patient exhibits appropriate insight and judgment.    ___________ RADIOLOGY  Right hand x-ray Impression: No acute fracture or radiopaque foreign body.   I reviewed the images.  _____________ PROCEDURES ? Procedure(s) performed:   LACERATION REPAIR Performed by: Darletta Moll Authorized by: Charline Bills Merin Borjon Consent: Verbal consent obtained. Risks and benefits: risks, benefits and alternatives were discussed Consent given by: patient Patient identity confirmed: provided demographic data Prepped and Draped in normal sterile fashion Wound explored  Laceration Location dorsal right hand  Laceration Length: 3 cm  No Foreign Bodies seen or palpated  Anesthesia: local infiltration  Local anesthetic: lidocaine 1 % with epinephrine  Anesthetic total: 5 ml  Irrigation method: syringe Amount of cleaning: standard  Skin closure: 4-0 Ethilon sutures   Number of sutures: 6   Technique: Simple interrupted   Patient tolerance: Patient tolerated the procedure well with no immediate complications.     Medications  lidocaine-EPINEPHrine (XYLOCAINE-EPINEPHrine) 1 %-1:200000 (PF) injection (  Given by Other 01/20/15 2133)    ______________________________________________________ INITIAL IMPRESSION / ASSESSMENT AND PLAN / ED COURSE ? Pertinent labs & imaging results that were available during my care of the patient were reviewed by me  and considered in my medical decision making (see chart for details).    Patient presents emergency Department with a laceration to the dorsal aspect of his right hand. Patient states he was drinking alcohol last bottles when one broke and he accidentally cut his head. He was able to control bleeding prior to arrival. X-ray reveals no foreign body. Inspection of wound reveals no foreign body. Area is thoroughly cleansed, irrigated, sutured closed.    New Prescriptions   No medications on file   ____________________________________________ FINAL CLINICAL IMPRESSION(S) / ED DIAGNOSES?  Final diagnoses:  Hand laceration, right, initial encounter       Darletta Moll, PA-C 01/20/15 2156  Delman Kitten, MD 01/20/15 [phone]

## 2015-01-20 NOTE — ED Notes (Signed)
Patient transported to X-ray 

## 2015-01-20 NOTE — Discharge Instructions (Signed)
Laceration Care, Adult °A laceration is a cut that goes through all of the layers of the skin and into the tissue that is right under the skin. Some lacerations heal on their own. Others need to be closed with stitches (sutures), staples, skin adhesive strips, or skin glue. Proper laceration care minimizes the risk of infection and helps the laceration to heal better. °HOW TO CARE FOR YOUR LACERATION °If sutures or staples were used: °· Keep the wound clean and dry. °· If you were given a bandage (dressing), you should change it at least one time per day or as told by your health care provider. You should also change it if it becomes wet or dirty. °· Keep the wound completely dry for the first 24 hours or as told by your health care provider. After that time, you may shower or bathe. However, make sure that the wound is not soaked in water until after the sutures or staples have been removed. °· Clean the wound one time each day or as told by your health care provider: °· Wash the wound with soap and water. °· Rinse the wound with water to remove all soap. °· Pat the wound dry with a clean towel. Do not rub the wound. °· After cleaning the wound, apply a thin layer of antibiotic ointment as told by your health care provider. This will help to prevent infection and keep the dressing from sticking to the wound. °· Have the sutures or staples removed as told by your health care provider. °If skin adhesive strips were used: °· Keep the wound clean and dry. °· If you were given a bandage (dressing), you should change it at least one time per day or as told by your health care provider. You should also change it if it becomes dirty or wet. °· Do not get the skin adhesive strips wet. You may shower or bathe, but be careful to keep the wound dry. °· If the wound gets wet, pat it dry with a clean towel. Do not rub the wound. °· Skin adhesive strips fall off on their own. You may trim the strips as the wound heals. Do not  remove skin adhesive strips that are still stuck to the wound. They will fall off in time. °If skin glue was used: °· Try to keep the wound dry, but you may briefly wet it in the shower or bath. Do not soak the wound in water, such as by swimming. °· After you have showered or bathed, gently pat the wound dry with a clean towel. Do not rub the wound. °· Do not do any activities that will make you sweat heavily until the skin glue has fallen off on its own. °· Do not apply liquid, cream, or ointment medicine to the wound while the skin glue is in place. Using those may loosen the film before the wound has healed. °· If you were given a bandage (dressing), you should change it at least one time per day or as told by your health care provider. You should also change it if it becomes dirty or wet. °· If a dressing is placed over the wound, be careful not to apply tape directly over the skin glue. Doing that may cause the glue to be pulled off before the wound has healed. °· Do not pick at the glue. The skin glue usually remains in place for 5-10 days, then it falls off of the skin. °General Instructions °· Take over-the-counter and prescription   medicines only as told by your health care provider. °· If you were prescribed an antibiotic medicine or ointment, take or apply it as told by your doctor. Do not stop using it even if your condition improves. °· To help prevent scarring, make sure to cover your wound with sunscreen whenever you are outside after stitches are removed, after adhesive strips are removed, or when glue remains in place and the wound is healed. Make sure to wear a sunscreen of at least 30 SPF. °· Do not scratch or pick at the wound. °· Keep all follow-up visits as told by your health care provider. This is important. °· Check your wound every day for signs of infection. Watch for: °· Redness, swelling, or pain. °· Fluid, blood, or pus. °· Raise (elevate) the injured area above the level of your heart  while you are sitting or lying down, if possible. °SEEK MEDICAL CARE IF: °· You received a tetanus shot and you have swelling, severe pain, redness, or bleeding at the injection site. °· You have a fever. °· A wound that was closed breaks open. °· You notice a bad smell coming from your wound or your dressing. °· You notice something coming out of the wound, such as wood or glass. °· Your pain is not controlled with medicine. °· You have increased redness, swelling, or pain at the site of your wound. °· You have fluid, blood, or pus coming from your wound. °· You notice a change in the color of your skin near your wound. °· You need to change the dressing frequently due to fluid, blood, or pus draining from the wound. °· You develop a new rash. °· You develop numbness around the wound. °SEEK IMMEDIATE MEDICAL CARE IF: °· You develop severe swelling around the wound. °· Your pain suddenly increases and is severe. °· You develop painful lumps near the wound or on skin that is anywhere on your body. °· You have a red streak going away from your wound. °· The wound is on your hand or foot and you cannot properly move a finger or toe. °· The wound is on your hand or foot and you notice that your fingers or toes look pale or bluish. °  °This information is not intended to replace advice given to you by your health care provider. Make sure you discuss any questions you have with your health care provider. °  °Document Released: 01/08/2005 Document Revised: 05/25/2014 Document Reviewed: 01/04/2014 °Elsevier Interactive Patient Education ©2016 [employer] ° °Stitches, Staples, or Adhesive Wound Closure °Health care providers use stitches (sutures), staples, and certain glue (skin adhesives) to hold skin together while it heals (wound closure). You may need this treatment after you have surgery or if you cut your skin accidentally. These methods help your skin to heal more quickly and make it less likely that you will have  a scar. A wound may take several months to heal completely. °The type of wound you have determines when your wound gets closed. In most cases, the wound is closed as soon as possible (primary skin closure). Sometimes, closure is delayed so the wound can be cleaned and allowed to heal naturally. This reduces the chance of infection. Delayed closure may be needed if your wound: °· Is caused by a bite. °· Happened more than 6 hours ago. °· Involves loss of skin or the tissues under the skin. °· Has dirt or debris in it that cannot be removed. °· Is infected. °WHAT   ARE THE DIFFERENT KINDS OF WOUND CLOSURES? °There are many options for wound closure. The one that your health care provider uses depends on how deep and how large your wound is. °Adhesive Glue °To use this type of glue to close a wound, your health care provider holds the edges of the wound together and paints the glue on the surface of your skin. You may need more than one layer of glue. Then the wound may be covered with a light bandage (dressing). °This type of skin closure may be used for small wounds that are not deep (superficial). Using glue for wound closure is less painful than other methods. It does not require a medicine that numbs the area (local anesthetic). This method also leaves nothing to be removed. Adhesive glue is often used for children and on facial wounds. °Adhesive glue cannot be used for wounds that are deep, uneven, or bleeding. It is not used inside of a wound.  °Adhesive Strips °These strips are made of sticky (adhesive), porous paper. They are applied across your skin edges like a regular adhesive bandage. You leave them on until they fall off. °Adhesive strips may be used to close very superficial wounds. They may also be used along with sutures to improve the closure of your skin edges.  °Sutures °Sutures are the oldest method of wound closure. Sutures can be made from natural substances, such as silk, or from synthetic  materials, such as nylon and steel. They can be made from a material that your body can break down as your wound heals (absorbable), or they can be made from a material that needs to be removed from your skin (nonabsorbable). They come in many different strengths and sizes. °Your health care provider attaches the sutures to a steel needle on one end. Sutures can be passed through your skin, or through the tissues beneath your skin. Then they are tied and cut. Your skin edges may be closed in one continuous stitch or in separate stitches. °Sutures are strong and can be used for all kinds of wounds. Absorbable sutures may be used to close tissues under the skin. The disadvantage of sutures is that they may cause skin reactions that lead to infection. Nonabsorbable sutures need to be removed. °Staples °When surgical staples are used to close a wound, the edges of your skin on both sides of the wound are brought close together. A staple is placed across the wound, and an instrument secures the edges together. Staples are often used to close surgical cuts (incisions). °Staples are faster to use than sutures, and they cause less skin reaction. Staples need to be removed using a tool that bends the staples away from your skin. °HOW DO I CARE FOR MY WOUND CLOSURE? °· Take medicines only as directed by your health care provider. °· If you were prescribed an antibiotic medicine for your wound, finish it all even if you start to feel better. °· Use ointments or creams only as directed by your health care provider. °· Wash your hands with soap and water before and after touching your wound. °· Do not soak your wound in water. Do not take baths, swim, or use a hot tub until your health care provider approves. °· Ask your health care provider when you can start showering. Cover your wound if directed by your health care provider. °· Do not take out your own sutures or staples. °· Do not pick at your wound. Picking can cause an  infection. °·   Keep all follow-up visits as directed by your health care provider. This is important. °HOW LONG WILL I HAVE MY WOUND CLOSURE? °· Leave adhesive glue on your skin until the glue peels away. °· Leave adhesive strips on your skin until the strips fall off. °· Absorbable sutures will dissolve within several days. °· Nonabsorbable sutures and staples must be removed. The location of the wound will determine how long they stay in. This can range from several days to a couple of weeks. °WHEN SHOULD I SEEK HELP FOR MY WOUND CLOSURE? °Contact your health care provider if: °· You have a fever. °· You have chills. °· You have drainage, redness, swelling, or pain at your wound. °· There is a bad smell coming from your wound. °· The skin edges of your wound start to separate after your sutures have been removed. °· Your wound becomes thick, raised, and darker in color after your sutures come out (scarring). °  °This information is not intended to replace advice given to you by your health care provider. Make sure you discuss any questions you have with your health care provider. °  °Document Released: 10/03/2000 Document Revised: 01/29/2014 Document Reviewed: 06/17/2013 °Elsevier Interactive Patient Education ©2016 [employer] ° °

## 2015-01-20 NOTE — ED Notes (Signed)
Patient drank too many beers and fell over onto the bottle. The bottle broke and he presents with a laceration to top of the right hand. Large area of swelling noted. Minimal bleeding at this time.

## 2015-01-20 NOTE — ED Notes (Signed)
Pt reports being at a party and having a few too many beers; states he fell over, breaking a beer bottle.  Pt presents w/ laceration to top of right hand, no bleeding at this time.  Swelling noted to that area.

## 2015-03-30 ENCOUNTER — Encounter: Payer: Self-pay | Admitting: Family Medicine

## 2015-03-30 MED ORDER — PAROXETINE HCL 40 MG PO TABS: 40.0000 mg | ORAL_TABLET | Freq: Every day | ORAL | Status: DC

## 2015-03-30 NOTE — Telephone Encounter (Signed)
higher dose sent in. plz schedule f/u visit in 1-2 months

## 2015-03-30 NOTE — Telephone Encounter (Signed)
Patient notified and follow up scheduled

## 2015-03-30 NOTE — Telephone Encounter (Signed)
Please see Mychart message.

## 2015-05-13 ENCOUNTER — Ambulatory Visit (INDEPENDENT_AMBULATORY_CARE_PROVIDER_SITE_OTHER): Payer: BLUE CROSS/BLUE SHIELD | Admitting: Family Medicine

## 2015-05-13 ENCOUNTER — Encounter: Payer: Self-pay | Admitting: Family Medicine

## 2015-05-13 VITALS — BP 142/94 | HR 110 | Temp 98.0°F | Wt 225.0 lb

## 2015-05-13 DIAGNOSIS — I10 Essential (primary) hypertension: Secondary | ICD-10-CM | POA: Diagnosis not present

## 2015-05-13 DIAGNOSIS — M109 Gout, unspecified: Secondary | ICD-10-CM

## 2015-05-13 DIAGNOSIS — M10062 Idiopathic gout, left knee: Secondary | ICD-10-CM

## 2015-05-13 DIAGNOSIS — F32A Depression, unspecified: Secondary | ICD-10-CM

## 2015-05-13 DIAGNOSIS — F329 Major depressive disorder, single episode, unspecified: Secondary | ICD-10-CM | POA: Diagnosis not present

## 2015-05-13 LAB — CBC WITH DIFFERENTIAL/PLATELET
BASOS PCT: 0.3 % (ref 0.0–3.0)
Basophils Absolute: 0 10*3/uL (ref 0.0–0.1)
EOS PCT: 2.3 % (ref 0.0–5.0)
Eosinophils Absolute: 0.3 10*3/uL (ref 0.0–0.7)
HCT: 45.2 % (ref 39.0–52.0)
Hemoglobin: 15.5 g/dL (ref 13.0–17.0)
LYMPHS ABS: 2.2 10*3/uL (ref 0.7–4.0)
Lymphocytes Relative: 19.2 % (ref 12.0–46.0)
MCHC: 34.4 g/dL (ref 30.0–36.0)
MCV: 91.7 fl (ref 78.0–100.0)
MONO ABS: 1.1 10*3/uL — AB (ref 0.1–1.0)
Monocytes Relative: 9.9 % (ref 3.0–12.0)
NEUTROS PCT: 68.3 % (ref 43.0–77.0)
Neutro Abs: 7.9 10*3/uL — ABNORMAL HIGH (ref 1.4–7.7)
PLATELETS: 234 10*3/uL (ref 150.0–400.0)
RBC: 4.93 Mil/uL (ref 4.22–5.81)
RDW: 14.4 % (ref 11.5–15.5)
WBC: 11.6 10*3/uL — ABNORMAL HIGH (ref 4.0–10.5)

## 2015-05-13 LAB — SEDIMENTATION RATE: Sed Rate: 20 mm/hr (ref 0–22)

## 2015-05-13 LAB — URIC ACID: URIC ACID, SERUM: 8 mg/dL — AB (ref 4.0–7.8)

## 2015-05-13 MED ORDER — COLCHICINE 0.6 MG PO TABS: 0.6000 mg | ORAL_TABLET | Freq: Every day | ORAL | Status: DC | PRN

## 2015-05-13 NOTE — Assessment & Plan Note (Signed)
Doing better on higher paxil dose 40mg  daily.

## 2015-05-13 NOTE — Progress Notes (Signed)
BP 142/94 mmHg  Pulse 110  Temp(Src) 98 F (36.7 C) (Oral)  Wt 225 lb (102.059 kg)  SpO2 96%   CC: L knee pain  Subjective:    Patient ID: Daniel May, male    DOB: 27-May-1965, 50 y.o.   MRN: [id number]  HPI: Daniel May is a 50 y.o. male presenting on 05/13/2015 for Follow-up   Working on deck, climbing ladders recently over last several weeks. Last weekend took a nap, when he woke up unable to bear any weight on left leg. Denies any inciting trauma/injury. Seen at [facility] on Tuesday - told possible knee bursitis. Placed in crutches, treated with naprosyn and hydrocodone/APAP. Swelling much better. Ongoing pain worse with weight bearing. + swelling along with some endorsed redness/warmth, now improving. Predominant pain L lateral knee.   No fevers/chills, no injury, ankle or foot pain, no back or hip or thigh pain.    He does regularly drink alcohol. He did recently increase shrimp intake (shrimp salad)  Similar episode 7 months ago - out of work for a week then another 1 week when re injured. Has had several episodes of 1st MTP pain/redness/swelling.  Recently paxil increased - doing better.  Compliant with cozaar 50mg  daily for blood pressure.  Relevant past medical, surgical, family and social history reviewed and updated as indicated. Interim medical history since our last visit reviewed. Allergies and medications reviewed and updated. Current Outpatient Prescriptions on File Prior to Visit  Medication Sig  . losartan (COZAAR) 50 MG tablet TAKE ONE TABLET BY MOUTH ONCE DAILY  . PARoxetine (PAXIL) 40 MG tablet Take 1 tablet (40 mg total) by mouth daily.   No current facility-administered medications on file prior to visit.    Review of Systems Per HPI unless specifically indicated in ROS section     Objective:    BP 142/94 mmHg  Pulse 110  Temp(Src) 98 F (36.7 C) (Oral)  Wt 225 lb (102.059 kg)  SpO2 96%  Wt Readings from Last 3 Encounters:  05/13/15 225 lb  (102.059 kg)  01/20/15 225 lb (102.059 kg)  07/28/14 224 lb 8 oz (101.833 kg)    Physical Exam  Constitutional: He appears well-developed and well-nourished. No distress.  Musculoskeletal: He exhibits no edema.  R knee WNL L Knee exam: No deformity on inspection. Tender to palpation lateral left knee joint + swelling lateral knee without calor or erythema Limited ROM in flex/extension without crepitus. No popliteal fullness. Neg drawer test. Neg mcmurray test. No pain with valgus/varus stress. No PFgrind. No abnormal patellar mobility.   Skin: Skin is warm and dry. No rash noted. No erythema.  Psychiatric: He has a normal mood and affect.  Nursing note and vitals reviewed.  Results for orders placed or performed in visit on 09/13/14  Uric Acid  Result Value Ref Range   Uric Acid, Serum 8.1 (H) 4.0 - 7.8 mg/dL   Lab Results  Component Value Date   CREATININE 1.13 07/21/2014       Assessment & Plan:   Problem List Items Addressed This Visit    Depression    Doing better on higher paxil dose 40mg  daily.      HTN (hypertension)    bp elevated, pt in pain. No changes today. If persistent tachycardia next visit, will start beta blocker - has f/u next week.       Acute gout - Primary    Story/exam consistent with gout flare - significant alcohol use, sudden onset of  synovitis without endorsed injury, recent increase in seafood intake, endorses h/o podagra.  Add colchicine 0.6mg  QD PRN gout flare. Continue naprosyn with hydrocodone for breakthrough pain.  Discussed dietary changes to prevent future flares.  Discussed consider vit C or cherry juice as well.       Relevant Orders   CBC with Differential/Platelet   Uric acid   Sedimentation rate       Follow up plan: No Follow-up on file.  Ria Bush, MD

## 2015-05-13 NOTE — Assessment & Plan Note (Signed)
Story/exam consistent with gout flare - significant alcohol use, sudden onset of synovitis without endorsed injury, recent increase in seafood intake, endorses h/o podagra.  Add colchicine 0.6mg  QD PRN gout flare. Continue naprosyn with hydrocodone for breakthrough pain.  Discussed dietary changes to prevent future flares.  Discussed consider vit C or cherry juice as well.

## 2015-05-13 NOTE — Patient Instructions (Addendum)
I am suspicious for gout flare. Labwork today. Continue naprosyn with hydrocodone for breakthrough pain. Start colchicine 1 tablet daily as needed for gout flare. If frequent gout flares, let us know to consider daily gout lowering medication. Consider starting vit C 500mg  daily, consider cherry juice both of which can help as well.  Gout Gout is an inflammatory arthritis caused by a buildup of uric acid crystals in the joints. Uric acid is a chemical that is normally present in the blood. When the level of uric acid in the blood is too high it can form crystals that deposit in your joints and tissues. This causes joint redness, soreness, and swelling (inflammation). Repeat attacks are common. Over time, uric acid crystals can form into masses (tophi) near a joint, destroying bone and causing disfigurement. Gout is treatable and often preventable. CAUSES  The disease begins with elevated levels of uric acid in the blood. Uric acid is produced by your body when it breaks down a naturally found substance called purines. Certain foods you eat, such as meats and fish, contain high amounts of purines. Causes of an elevated uric acid level include:  Being passed down from parent to child (heredity).  Diseases that cause increased uric acid production (such as obesity, psoriasis, and certain cancers).  Excessive alcohol use.  Diet, especially diets rich in meat and seafood.  Medicines, including certain cancer-fighting medicines (chemotherapy), water pills (diuretics), and aspirin.  Chronic kidney disease. The kidneys are no longer able to remove uric acid well.  Problems with metabolism. Conditions strongly associated with gout include:  Obesity.  High blood pressure.  High cholesterol.  Diabetes. Not everyone with elevated uric acid levels gets gout. It is not understood why some people get gout and others do not. Surgery, joint injury, and eating too much of certain foods are some of  the factors that can lead to gout attacks. SYMPTOMS   An attack of gout comes on quickly. It causes intense pain with redness, swelling, and warmth in a joint.  Fever can occur.  Often, only one joint is involved. Certain joints are more commonly involved:  Base of the big toe.  Knee.  Ankle.  Wrist.  Finger. Without treatment, an attack usually goes away in a few days to weeks. Between attacks, you usually will not have symptoms, which is different from many other forms of arthritis. DIAGNOSIS  Your caregiver will suspect gout based on your symptoms and exam. In some cases, tests may be recommended. The tests may include:  Blood tests.  Urine tests.  X-rays.  Joint fluid exam. This exam requires a needle to remove fluid from the joint (arthrocentesis). Using a microscope, gout is confirmed when uric acid crystals are seen in the joint fluid. TREATMENT  There are two phases to gout treatment: treating the sudden onset (acute) attack and preventing attacks (prophylaxis).  Treatment of an Acute Attack.  Medicines are used. These include anti-inflammatory medicines or steroid medicines.  An injection of steroid medicine into the affected joint is sometimes necessary.  The painful joint is rested. Movement can worsen the arthritis.  You may use warm or cold treatments on painful joints, depending which works best for you.  Treatment to Prevent Attacks.  If you suffer from frequent gout attacks, your caregiver may advise preventive medicine. These medicines are started after the acute attack subsides. These medicines either help your kidneys eliminate uric acid from your body or decrease your uric acid production. You may need to stay  on these medicines for a very long time.  The early phase of treatment with preventive medicine can be associated with an increase in acute gout attacks. For this reason, during the first few months of treatment, your caregiver may also advise  you to take medicines usually used for acute gout treatment. Be sure you understand your caregiver's directions. Your caregiver may make several adjustments to your medicine dose before these medicines are effective.  Discuss dietary treatment with your caregiver or dietitian. Alcohol and drinks high in sugar and fructose and foods such as meat, poultry, and seafood can increase uric acid levels. Your caregiver or dietitian can advise you on drinks and foods that should be limited. HOME CARE INSTRUCTIONS   Do not take aspirin to relieve pain. This raises uric acid levels.  Only take over-the-counter or prescription medicines for pain, discomfort, or fever as directed by your caregiver.  Rest the joint as much as possible. When in bed, keep sheets and blankets off painful areas.  Keep the affected joint raised (elevated).  Apply warm or cold treatments to painful joints. Use of warm or cold treatments depends on which works best for you.  Use crutches if the painful joint is in your leg.  Drink enough fluids to keep your urine clear or pale yellow. This helps your body get rid of uric acid. Limit alcohol, sugary drinks, and fructose drinks.  Follow your dietary instructions. Pay careful attention to the amount of protein you eat. Your daily diet should emphasize fruits, vegetables, whole grains, and fat-free or low-fat milk products. Discuss the use of coffee, vitamin C, and cherries with your caregiver or dietitian. These may be helpful in lowering uric acid levels.  Maintain a healthy body weight. SEEK MEDICAL CARE IF:   You develop diarrhea, vomiting, or any side effects from medicines.  You do not feel better in 24 hours, or you are getting worse. SEEK IMMEDIATE MEDICAL CARE IF:   Your joint becomes suddenly more tender, and you have chills or a fever. MAKE SURE YOU:   Understand these instructions.  Will watch your condition.  Will get help right away if you are not doing well  or get worse.   This information is not intended to replace advice given to you by your health care provider. Make sure you discuss any questions you have with your health care provider.   Document Released: 01/06/2000 Document Revised: 01/29/2014 Document Reviewed: 08/22/2011 Elsevier Interactive Patient Education [employer].

## 2015-05-13 NOTE — Assessment & Plan Note (Addendum)
bp elevated, pt in pain. No changes today. If persistent tachycardia next visit, will start beta blocker - has f/u next week.

## 2015-05-13 NOTE — Progress Notes (Signed)
Pre visit review using our clinic review tool, if applicable. No additional management support is needed unless otherwise documented below in the visit note. 

## 2015-05-17 ENCOUNTER — Telehealth: Payer: Self-pay

## 2015-05-17 NOTE — Telephone Encounter (Signed)
Pt left v/m; pt was seen 05/13/15; pt was to cb if knee did not show improvement; pt cannot fully extend his lt knee and cannot put all of body weight on lt knee; pt request what is next thing to do. Pt request cb.

## 2015-05-18 NOTE — Telephone Encounter (Signed)
Appt scheduled

## 2015-05-18 NOTE — Telephone Encounter (Signed)
Spoke with patient -  Denies fever, redness/warmth/swelling of joint. Will come in tomorrow for re eval. Please schedule for 9:30am.

## 2015-05-19 ENCOUNTER — Encounter: Payer: Self-pay | Admitting: Family Medicine

## 2015-05-19 ENCOUNTER — Ambulatory Visit (INDEPENDENT_AMBULATORY_CARE_PROVIDER_SITE_OTHER): Payer: BLUE CROSS/BLUE SHIELD | Admitting: Family Medicine

## 2015-05-19 ENCOUNTER — Ambulatory Visit (INDEPENDENT_AMBULATORY_CARE_PROVIDER_SITE_OTHER)
Admission: RE | Admit: 2015-05-19 | Discharge: 2015-05-19 | Disposition: A | Discharge status: Home / Self Care | Payer: BLUE CROSS/BLUE SHIELD | Source: Ambulatory Visit | Attending: Family Medicine | Admitting: Family Medicine

## 2015-05-19 ENCOUNTER — Other Ambulatory Visit: Payer: Self-pay | Admitting: Family Medicine

## 2015-05-19 VITALS — BP 156/84 | HR 87 | Temp 98.2°F | Wt 226.5 lb

## 2015-05-19 DIAGNOSIS — M25562 Pain in left knee: Secondary | ICD-10-CM | POA: Diagnosis not present

## 2015-05-19 DIAGNOSIS — M109 Gout, unspecified: Secondary | ICD-10-CM

## 2015-05-19 DIAGNOSIS — M10062 Idiopathic gout, left knee: Secondary | ICD-10-CM

## 2015-05-19 MED ORDER — PREDNISONE 20 MG PO TABS: ORAL_TABLET | ORAL | Status: DC

## 2015-05-19 NOTE — Assessment & Plan Note (Signed)
Check xray today given ongoing pain - overall preserved joint spaces on my evaluation. Will await radiology read.  Exam still not consistent with septic joint (and ESR normal last week).  Anticipate ongoing gouty arthritis - increase colchicine to 0.67m BID x 3 days. If no improvement, will hold naprosyn and treat with short prednisone burst. Pt agrees with plan.  He will send me STD paperwork to fill out.

## 2015-05-19 NOTE — Progress Notes (Signed)
Pre visit review using our clinic review tool, if applicable. No additional management support is needed unless otherwise documented below in the visit note. 

## 2015-05-19 NOTE — Patient Instructions (Signed)
xrays looking ok. Double up on colchicine twice daily for 3 days then back to once daily as needed. Prednisone prescrpition printed today in case not improving as expected over weekend.   E-mail me form for work [email or web address]

## 2015-05-19 NOTE — Progress Notes (Signed)
BP 156/84 mmHg  Pulse 87  Temp(Src) 98.2 F (36.8 C) (Oral)  Wt 226 lb 8 oz (102.74 kg)  SpO2 95%   CC: f/u L knee pain  Subjective:    Patient ID: Daniel May, male    DOB: 1965/05/10, 50 y.o.   MRN: [id number]  HPI: Daniel May is a 50 y.o. male presenting on 05/19/2015 for Recheck knee   See prior note for details. Knee pain started 1.5 wks ago. Briefly, seen at [facility] on 05/10/2015 with dx knee bursitis treated with naprosyn, hydrocodone and crutches. Seen here 05/13/2015 with exam consistent with acute gouty arthritis of L knee, urate 8.1, WBC 11.6, ESR normal at 20. Treated with continued naprosyn and colchicine was added.   Called yesterday with concern for persistent inability to bear weight and limited ROM at knee. Presents today for f/u - actually much better. No fever, warmth, swelling. Ongoing pain with certain movements but no significant pain to touch.  Now off crutches.  Continues meds.  Out of work since 4/17.  Relevant past medical, surgical, family and social history reviewed and updated as indicated. Interim medical history since our last visit reviewed. Allergies and medications reviewed and updated. Current Outpatient Prescriptions on File Prior to Visit  Medication Sig  . colchicine 0.6 MG tablet Take 1 tablet (0.6 mg total) by mouth daily as needed (gout flare).  Marland Kitchen losartan (COZAAR) 50 MG tablet TAKE ONE TABLET BY MOUTH ONCE DAILY  . naproxen (NAPROSYN) 500 MG tablet Take 500 mg by mouth 2 (two) times daily as needed.  Marland Kitchen PARoxetine (PAXIL) 40 MG tablet Take 1 tablet (40 mg total) by mouth daily.   No current facility-administered medications on file prior to visit.    Review of Systems Per HPI unless specifically indicated in ROS section     Objective:    BP 156/84 mmHg  Pulse 87  Temp(Src) 98.2 F (36.8 C) (Oral)  Wt 226 lb 8 oz (102.74 kg)  SpO2 95%  Wt Readings from Last 3 Encounters:  05/19/15 226 lb 8 oz (102.74 kg)  05/13/15 225 lb (102.059 kg)   01/20/15 225 lb (102.059 kg)    Physical Exam  Constitutional: He appears well-developed and well-nourished. No distress.  Musculoskeletal: He exhibits no edema.  R knee WNL L Knee exam: No deformity on inspection. No pain with palpation of knee landmarks. No effusion/swelling noted. Limited full extension due to pain but near full ROM with flexion No popliteal fullness. Neg drawer test. Neg mcmurray test. No pain with valgus/varus stress. No PFgrind. No abnormal patellar mobility.   Neurological:  Antalgic gait  Nursing note and vitals reviewed.  Results for orders placed or performed in visit on 05/13/15  CBC with Differential/Platelet  Result Value Ref Range   WBC 11.6 (H) 4.0 - 10.5 K/uL   RBC 4.93 4.22 - 5.81 Mil/uL   Hemoglobin 15.5 13.0 - 17.0 g/dL   HCT 45.2 39.0 - 52.0 %   MCV 91.7 78.0 - 100.0 fl   MCHC 34.4 30.0 - 36.0 g/dL   RDW 14.4 11.5 - 15.5 %   Platelets 234.0 150.0 - 400.0 K/uL   Neutrophils Relative % 68.3 43.0 - 77.0 %   Lymphocytes Relative 19.2 12.0 - 46.0 %   Monocytes Relative 9.9 3.0 - 12.0 %   Eosinophils Relative 2.3 0.0 - 5.0 %   Basophils Relative 0.3 0.0 - 3.0 %   Neutro Abs 7.9 (H) 1.4 - 7.7 K/uL   Lymphs Abs  2.2 0.7 - 4.0 K/uL   Monocytes Absolute 1.1 (H) 0.1 - 1.0 K/uL   Eosinophils Absolute 0.3 0.0 - 0.7 K/uL   Basophils Absolute 0.0 0.0 - 0.1 K/uL  Uric acid  Result Value Ref Range   Uric Acid, Serum 8.0 (H) 4.0 - 7.8 mg/dL  Sedimentation rate  Result Value Ref Range   Sed Rate 20 0 - 22 mm/hr      Assessment & Plan:   Problem List Items Addressed This Visit    Acute gout - Primary    Check xray today given ongoing pain - overall preserved joint spaces on my evaluation. Will await radiology read.  Exam still not consistent with septic joint (and ESR normal last week).  Anticipate ongoing gouty arthritis - increase colchicine to 0.23m BID x 3 days. If no improvement, will hold naprosyn and treat with short prednisone burst.  Pt agrees with plan.  He will send me STD paperwork to fill out.        Other Visit Diagnoses    Left knee pain            Follow up plan: Return if symptoms worsen or fail to improve.  JRia Bush MD

## 2015-05-23 ENCOUNTER — Telehealth: Payer: Self-pay | Admitting: Family Medicine

## 2015-05-23 NOTE — Telephone Encounter (Signed)
plz notify I received medical leave forms and filled out.  I cited 4 wk recovery time, how is knee feeling after weekend?  When is plan to return to work date and can we keep him on light duty for 1 week? (no heavy lifting >20lbs) No contact info to fax in - pt will have to come pick up. Placed forms in [employer] box.

## 2015-05-24 NOTE — Telephone Encounter (Signed)
Spoke with patient and he said he is doing some better but still having issues when sitting. He went ahead and started the steroids on 05/21/15. He isn't sure that his job can accommodate light duty, but he will check. He said he didn't have an exact date to go back and said he has a follow up with you next week and will talk to you then. He asked that forms be faxed to [phone] Attn: [place] as requested.

## 2015-05-24 NOTE — Telephone Encounter (Signed)
Message left for patient to return my call.  

## 2015-05-25 NOTE — Telephone Encounter (Signed)
Beth @ [employer] called she has new fax number [phone] Phone # [phone] Can you refax patient paperwork

## 2015-05-25 NOTE — Telephone Encounter (Signed)
Daniel May [employer] called.  She received the paperwork, but she's confused about the restrictions.  Please call Daniel May back at  [phone].

## 2015-05-25 NOTE — Telephone Encounter (Signed)
Refaxed as requested. 

## 2015-05-25 NOTE — Telephone Encounter (Signed)
Spoke with [employer] and answered questions.

## 2015-05-30 ENCOUNTER — Ambulatory Visit: Payer: BLUE CROSS/BLUE SHIELD | Admitting: Family Medicine

## 2015-06-02 ENCOUNTER — Encounter: Payer: Self-pay | Admitting: Family Medicine

## 2015-06-02 ENCOUNTER — Ambulatory Visit (INDEPENDENT_AMBULATORY_CARE_PROVIDER_SITE_OTHER): Payer: BLUE CROSS/BLUE SHIELD | Admitting: Family Medicine

## 2015-06-02 VITALS — BP 120/86 | HR 96 | Temp 98.3°F | Ht 70.0 in | Wt 225.1 lb

## 2015-06-02 DIAGNOSIS — M10062 Idiopathic gout, left knee: Secondary | ICD-10-CM | POA: Diagnosis not present

## 2015-06-02 DIAGNOSIS — F17200 Nicotine dependence, unspecified, uncomplicated: Secondary | ICD-10-CM

## 2015-06-02 DIAGNOSIS — Z720 Tobacco use: Secondary | ICD-10-CM | POA: Diagnosis not present

## 2015-06-02 DIAGNOSIS — I10 Essential (primary) hypertension: Secondary | ICD-10-CM | POA: Diagnosis not present

## 2015-06-02 DIAGNOSIS — Z7289 Other problems related to lifestyle: Secondary | ICD-10-CM

## 2015-06-02 DIAGNOSIS — M1A09X0 Idiopathic chronic gout, multiple sites, without tophus (tophi): Secondary | ICD-10-CM | POA: Diagnosis not present

## 2015-06-02 DIAGNOSIS — F1090 Alcohol use, unspecified, uncomplicated: Secondary | ICD-10-CM

## 2015-06-02 DIAGNOSIS — M109 Gout, unspecified: Secondary | ICD-10-CM | POA: Insufficient documentation

## 2015-06-02 DIAGNOSIS — F1010 Alcohol abuse, uncomplicated: Secondary | ICD-10-CM

## 2015-06-02 MED ORDER — ALLOPURINOL 100 MG PO TABS: 100.0000 mg | ORAL_TABLET | Freq: Every day | ORAL | Status: DC

## 2015-06-02 NOTE — Progress Notes (Signed)
Pre visit review using our clinic review tool, if applicable. No additional management support is needed unless otherwise documented below in the visit note. 

## 2015-06-02 NOTE — Assessment & Plan Note (Signed)
2-3 severe flares per year over last few years.  Interested in urate lowering therapy. Start allopurinol 100mg  daily. Discussed possible risk of precipitating gout flare and pt will update me if this occurs. Check urate next labwork.

## 2015-06-02 NOTE — Assessment & Plan Note (Signed)
Pt may be interested in trial of chantix in the future - discussed common side effects including but not limited to nausea/vomiting, vivid dreams, behaviour changes, discussed possible CV risk association. Pt will call me if desires script.

## 2015-06-02 NOTE — Patient Instructions (Addendum)
Ok to return to work 5/17. Start allopurinol 100mg  daily. Sent to pharmacy. We will recheck uric acid levels next lab work. Let us know about chantix. Good to see you today, return as needed.

## 2015-06-02 NOTE — Progress Notes (Addendum)
BP 120/86 mmHg  Pulse 96  Temp(Src) 98.3 F (36.8 C)  Ht 5\' 10"  (1.778 m)  Wt 225 lb 1.9 oz (102.114 kg)  BMI 32.30 kg/m2  SpO2 98%   CC: f/u visit  Subjective:    Patient ID: Daniel May, male    DOB: 04-May-1965, 50 y.o.   MRN: [id number]  HPI: Seung Schultheis is a 50 y.o. male presenting on 06/02/2015 for Follow-up   See prior note for details.  Knee pain improving. Mainly discomfort present with prolonged sitting. No further swelling, redness or warmth of knee. He did end up filling prednisone course, last dose 05/28/2015.   Out of work until 06/08/2015. Declines sooner return to work. Requests letter for work.  Interested in chantix in near future. Interested in daily urate lowering medication  Relevant past medical, surgical, family and social history reviewed and updated as indicated. Interim medical history since our last visit reviewed. Allergies and medications reviewed and updated. Current Outpatient Prescriptions on File Prior to Visit  Medication Sig  . losartan (COZAAR) 50 MG tablet TAKE ONE TABLET BY MOUTH ONCE DAILY  . PARoxetine (PAXIL) 40 MG tablet Take 1 tablet (40 mg total) by mouth daily.   No current facility-administered medications on file prior to visit.    Review of Systems Per HPI unless specifically indicated in ROS section     Objective:    BP 120/86 mmHg  Pulse 96  Temp(Src) 98.3 F (36.8 C)  Ht 5\' 10"  (1.778 m)  Wt 225 lb 1.9 oz (102.114 kg)  BMI 32.30 kg/m2  SpO2 98%  Wt Readings from Last 3 Encounters:  06/02/15 225 lb 1.9 oz (102.114 kg)  05/19/15 226 lb 8 oz (102.74 kg)  05/13/15 225 lb (102.059 kg)    Physical Exam  Constitutional: He appears well-developed and well-nourished. No distress.  Cardiovascular: Normal rate, regular rhythm, normal heart sounds and intact distal pulses.   No murmur heard. Occasional skipped beat  Musculoskeletal: He exhibits no edema.  FROM at L knee without pain, swelling, stiffness  Skin: Skin is  warm and dry. No rash noted.  Nursing note and vitals reviewed.  Results for orders placed or performed in visit on 05/13/15  CBC with Differential/Platelet  Result Value Ref Range   WBC 11.6 (H) 4.0 - 10.5 K/uL   RBC 4.93 4.22 - 5.81 Mil/uL   Hemoglobin 15.5 13.0 - 17.0 g/dL   HCT 45.2 39.0 - 52.0 %   MCV 91.7 78.0 - 100.0 fl   MCHC 34.4 30.0 - 36.0 g/dL   RDW 14.4 11.5 - 15.5 %   Platelets 234.0 150.0 - 400.0 K/uL   Neutrophils Relative % 68.3 43.0 - 77.0 %   Lymphocytes Relative 19.2 12.0 - 46.0 %   Monocytes Relative 9.9 3.0 - 12.0 %   Eosinophils Relative 2.3 0.0 - 5.0 %   Basophils Relative 0.3 0.0 - 3.0 %   Neutro Abs 7.9 (H) 1.4 - 7.7 K/uL   Lymphs Abs 2.2 0.7 - 4.0 K/uL   Monocytes Absolute 1.1 (H) 0.1 - 1.0 K/uL   Eosinophils Absolute 0.3 0.0 - 0.7 K/uL   Basophils Absolute 0.0 0.0 - 0.1 K/uL  Uric acid  Result Value Ref Range   Uric Acid, Serum 8.0 (H) 4.0 - 7.8 mg/dL  Sedimentation rate  Result Value Ref Range   Sed Rate 20 0 - 22 mm/hr      Assessment & Plan:   Problem List Items Addressed This Visit  Habitual alcohol use    Has significantly cut back in setting of gout flare.      HTN (hypertension)    Chronic, improved with decrease in pain from gout flare.      Smoker    Pt may be interested in trial of chantix in the future - discussed common side effects including but not limited to nausea/vomiting, vivid dreams, behaviour changes, discussed possible CV risk association. Pt will call me if desires script.       Acute gout - Primary    Almost fully resolved. Did need prednisone course. Colchicine BID dosing caused GI upset.  RTW date 06/08/2015.      Gout    2-3 severe flares per year over last few years.  Interested in urate lowering therapy. Start allopurinol 100mg  daily. Discussed possible risk of precipitating gout flare and pt will update me if this occurs. Check urate next labwork.           Follow up plan: No Follow-up on  file.  Ria Bush, MD

## 2015-06-02 NOTE — Addendum Note (Signed)
Addended by: Ria Bush on: 06/02/2015 01:42 PM   Modules accepted: Orders, Level of Service

## 2015-06-02 NOTE — Assessment & Plan Note (Signed)
Chronic, improved with decrease in pain from gout flare.

## 2015-06-02 NOTE — Assessment & Plan Note (Signed)
Has significantly cut back in setting of gout flare.

## 2015-06-02 NOTE — Assessment & Plan Note (Signed)
Almost fully resolved. Did need prednisone course. Colchicine BID dosing caused GI upset.  RTW date 06/08/2015.

## 2015-06-06 ENCOUNTER — Telehealth: Payer: Self-pay

## 2015-06-06 MED ORDER — PREDNISONE 20 MG PO TABS: ORAL_TABLET | ORAL | Status: DC

## 2015-06-06 MED ORDER — TRAMADOL HCL 50 MG PO TABS: 50.0000 mg | ORAL_TABLET | Freq: Three times a day (TID) | ORAL | Status: DC | PRN

## 2015-06-06 NOTE — Telephone Encounter (Signed)
Patient notified and verbalized understanding. 

## 2015-06-06 NOTE — Telephone Encounter (Signed)
Spoke with patient. rec also start colchicine 1 tab daily, will phone in tramadol for pain.

## 2015-06-06 NOTE — Telephone Encounter (Signed)
Stop allopurinol for now, start prednisone course sent to pharmacy.  Update with effect after 2 days on prednisone.

## 2015-06-06 NOTE — Telephone Encounter (Addendum)
Pt left v/m and wants to know if can have med for pain for the gout. Pt request cb. [place].

## 2015-06-06 NOTE — Telephone Encounter (Signed)
Pt left v/m; pt seen 06/02/15 for gout in lt knee; pt taking allopurinol; now pt has pain and swelling in lt foot, can't bend toes. Pt request cb with what to do due to gout flare up.

## 2015-06-06 NOTE — Addendum Note (Signed)
Addended by: Ria Bush on: 06/06/2015 05:37 PM   Modules accepted: Orders

## 2015-06-07 ENCOUNTER — Telehealth: Payer: Self-pay | Admitting: Family Medicine

## 2015-06-07 NOTE — Telephone Encounter (Signed)
Beth called needing a note per 5/11 visit when can pt go back to work She stated you  Filled out fmla paperwork for pt

## 2015-06-08 NOTE — Telephone Encounter (Signed)
Recurrent gout flare over weekend - can we call patient to see how he's doing today? Prior plan was to return to work today.

## 2015-06-08 NOTE — Telephone Encounter (Signed)
Spoke with patient. He said his swelling has decreased but he doesn't have full mobility or ability to fully weight-bearing at this time. He is taking meds as directed. He is hoping to go back to work next week. I scheduled a follow up to recheck and update paperwork. Beth notified.

## 2015-06-10 ENCOUNTER — Encounter: Payer: Self-pay | Admitting: Family Medicine

## 2015-06-10 ENCOUNTER — Ambulatory Visit (INDEPENDENT_AMBULATORY_CARE_PROVIDER_SITE_OTHER): Payer: BLUE CROSS/BLUE SHIELD | Admitting: Family Medicine

## 2015-06-10 VITALS — BP 154/92 | HR 80 | Temp 97.9°F | Wt 230.0 lb

## 2015-06-10 DIAGNOSIS — M10072 Idiopathic gout, left ankle and foot: Secondary | ICD-10-CM | POA: Diagnosis not present

## 2015-06-10 DIAGNOSIS — M1A09X0 Idiopathic chronic gout, multiple sites, without tophus (tophi): Secondary | ICD-10-CM | POA: Diagnosis not present

## 2015-06-10 MED ORDER — PREDNISONE 20 MG PO TABS: ORAL_TABLET | ORAL | Status: DC

## 2015-06-10 NOTE — Assessment & Plan Note (Signed)
Recurrent flare L podagra after recent L knee gouty attack. Prednisone and colchicine prn have significantly helped.

## 2015-06-10 NOTE — Progress Notes (Signed)
BP 154/92 mmHg  Pulse 80  Temp(Src) 97.9 F (36.6 C) (Oral)  Wt 230 lb (104.327 kg)  SpO2 97%   CC: f/u gout, fill out forms for work  Subjective:    Patient ID: Daniel May, male    DOB: 12/01/1965, 50 y.o.   MRN: [id number]  HPI: Daniel May is a 50 y.o. male presenting on 06/10/2015 for Follow-up   Recent acute gouty arthritis of left knee, now with right podagra over weekend. Treated with colchicine and prednisone - with marked improvement. Ready to return to work 06/13/2015. Needs work note filled out.  Relevant past medical, surgical, family and social history reviewed and updated as indicated. Interim medical history since our last visit reviewed. Allergies and medications reviewed and updated. Current Outpatient Prescriptions on File Prior to Visit  Medication Sig  . allopurinol (ZYLOPRIM) 100 MG tablet Take 1 tablet (100 mg total) by mouth daily.  . Colchicine 0.6 MG CAPS Take 1 capsule by mouth daily as needed.  Marland Kitchen losartan (COZAAR) 50 MG tablet TAKE ONE TABLET BY MOUTH ONCE DAILY  . PARoxetine (PAXIL) 40 MG tablet Take 1 tablet (40 mg total) by mouth daily.  . traMADol (ULTRAM) 50 MG tablet Take 1 tablet (50 mg total) by mouth every 8 (eight) hours as needed for moderate pain.   No current facility-administered medications on file prior to visit.    Review of Systems Per HPI unless specifically indicated in ROS section     Objective:    BP 154/92 mmHg  Pulse 80  Temp(Src) 97.9 F (36.6 C) (Oral)  Wt 230 lb (104.327 kg)  SpO2 97%  Wt Readings from Last 3 Encounters:  06/10/15 230 lb (104.327 kg)  06/02/15 225 lb 1.9 oz (102.114 kg)  05/19/15 226 lb 8 oz (102.74 kg)    Physical Exam  Constitutional: He appears well-developed and well-nourished. No distress.  Musculoskeletal: He exhibits no edema.  No erythema or warmth or swelling at this time L 1st MTP   Skin: Skin is warm and dry. No rash noted. No erythema.  Psychiatric: He has a normal mood and affect.    Nursing note and vitals reviewed.  Results for orders placed or performed in visit on 05/13/15  CBC with Differential/Platelet  Result Value Ref Range   WBC 11.6 (H) 4.0 - 10.5 K/uL   RBC 4.93 4.22 - 5.81 Mil/uL   Hemoglobin 15.5 13.0 - 17.0 g/dL   HCT 45.2 39.0 - 52.0 %   MCV 91.7 78.0 - 100.0 fl   MCHC 34.4 30.0 - 36.0 g/dL   RDW 14.4 11.5 - 15.5 %   Platelets 234.0 150.0 - 400.0 K/uL   Neutrophils Relative % 68.3 43.0 - 77.0 %   Lymphocytes Relative 19.2 12.0 - 46.0 %   Monocytes Relative 9.9 3.0 - 12.0 %   Eosinophils Relative 2.3 0.0 - 5.0 %   Basophils Relative 0.3 0.0 - 3.0 %   Neutro Abs 7.9 (H) 1.4 - 7.7 K/uL   Lymphs Abs 2.2 0.7 - 4.0 K/uL   Monocytes Absolute 1.1 (H) 0.1 - 1.0 K/uL   Eosinophils Absolute 0.3 0.0 - 0.7 K/uL   Basophils Absolute 0.0 0.0 - 0.1 K/uL  Uric acid  Result Value Ref Range   Uric Acid, Serum 8.0 (H) 4.0 - 7.8 mg/dL  Sedimentation rate  Result Value Ref Range   Sed Rate 20 0 - 22 mm/hr      Assessment & Plan:   Problem List Items  Addressed This Visit    Acute gout - Primary    Recurrent flare L podagra after recent L knee gouty attack. Prednisone and colchicine prn have significantly helped.       Gout    Hold allopurinol for several weeks then retrial. Provided with WASP for prednisone to fill if recurrent flare. Pt agrees with plan.          Follow up plan: No Follow-up on file.  Ria Bush, MD

## 2015-06-10 NOTE — Progress Notes (Signed)
Pre visit review using our clinic review tool, if applicable. No additional management support is needed unless otherwise documented below in the visit note. 

## 2015-06-10 NOTE — Assessment & Plan Note (Signed)
Hold allopurinol for several weeks then retrial. Provided with WASP for prednisone to fill if recurrent flare. Pt agrees with plan.

## 2015-06-10 NOTE — Patient Instructions (Signed)
Return to work on Monday. Wait a few weeks before trying allopurinol again - prednisone prescription printed in case recurrent flare on allopurinol - if it happens, start prednisone and continue allopurinol through flare.

## 2015-07-08 ENCOUNTER — Telehealth: Payer: Self-pay

## 2015-07-08 NOTE — Telephone Encounter (Signed)
Pt left /vm; pt having difficult time stopping drinking;pt request med that will make pt sick if he drinks alcohol. Pt request cb.last seen 06/10/15.

## 2015-07-09 MED ORDER — NALTREXONE HCL 50 MG PO TABS: 50.0000 mg | ORAL_TABLET | Freq: Every day | ORAL | Status: DC

## 2015-07-09 NOTE — Telephone Encounter (Signed)
Don't recommend antabuse first-line - would have him try naltrexone sent to pharmacy - can decrease alcohol craving. May try over the next month.  If continued need, would recommend lab visit to check liver function .

## 2015-07-11 NOTE — Telephone Encounter (Signed)
Patient notified and verbalized understanding. 

## 2015-09-16 ENCOUNTER — Other Ambulatory Visit: Payer: Self-pay | Admitting: Occupational Medicine

## 2015-09-16 ENCOUNTER — Ambulatory Visit: Payer: Self-pay

## 2015-09-16 DIAGNOSIS — Z0000 Encounter for general adult medical examination without abnormal findings: Secondary | ICD-10-CM

## 2015-09-28 ENCOUNTER — Other Ambulatory Visit: Payer: Self-pay

## 2015-09-28 MED ORDER — PAROXETINE HCL 40 MG PO TABS: 40.0000 mg | ORAL_TABLET | Freq: Every day | ORAL | 0 refills | Status: DC

## 2015-09-28 MED ORDER — LOSARTAN POTASSIUM 50 MG PO TABS: 50.0000 mg | ORAL_TABLET | Freq: Every day | ORAL | 0 refills | Status: DC

## 2015-09-28 NOTE — Telephone Encounter (Signed)
Pt request refill losartan and paroxetine to [facility] [place]. Last annual 07/28/14; refilled per protocol pt voiced understanding and will cb for CPX appt.

## 2015-10-04 ENCOUNTER — Telehealth: Payer: Self-pay

## 2015-10-04 NOTE — Telephone Encounter (Signed)
Pt left v/m requesting med that will make you sick if you drink alcohol; pt having difficult time quitting. Pt has lost insurance and request cb.

## 2015-10-04 NOTE — Telephone Encounter (Signed)
We previously tried naltrexone - was that not effective?  Antabuse can cause liver problems and he has h/o mild transaminitis recommend LFTs prior to starting.  As failed naltrexone, recommend come in for OV to discuss alcohol options and likely labs.

## 2015-10-05 NOTE — Telephone Encounter (Signed)
Pt returned your call - please call back thanks

## 2015-10-05 NOTE — Telephone Encounter (Signed)
Spoke with patient. Naltrexone was ineffective. He will call back to schedule appt later this week. He said he just had labs done at [facility] and was hoping they could be used to help save $. I couldn't locate them in chart. Requested for review.

## 2015-10-05 NOTE — Telephone Encounter (Signed)
Message left for patient to return my call.  

## 2015-10-11 NOTE — Telephone Encounter (Signed)
I've tried to get records with no success. Will try again. Have not notified patient yet.

## 2015-10-11 NOTE — Telephone Encounter (Signed)
Pt left v/m requesting cb about status of finding lab results done at [facility] occupational health.

## 2015-10-13 ENCOUNTER — Ambulatory Visit (INDEPENDENT_AMBULATORY_CARE_PROVIDER_SITE_OTHER): Payer: Self-pay | Admitting: Family Medicine

## 2015-10-13 ENCOUNTER — Encounter: Payer: Self-pay | Admitting: Family Medicine

## 2015-10-13 VITALS — BP 106/78 | HR 106 | Temp 98.9°F | Ht 70.0 in | Wt 226.5 lb

## 2015-10-13 DIAGNOSIS — Z720 Tobacco use: Secondary | ICD-10-CM

## 2015-10-13 DIAGNOSIS — F32A Depression, unspecified: Secondary | ICD-10-CM

## 2015-10-13 DIAGNOSIS — R740 Nonspecific elevation of levels of transaminase and lactic acid dehydrogenase [LDH]: Secondary | ICD-10-CM

## 2015-10-13 DIAGNOSIS — R7401 Elevation of levels of liver transaminase levels: Secondary | ICD-10-CM

## 2015-10-13 DIAGNOSIS — F1010 Alcohol abuse, uncomplicated: Secondary | ICD-10-CM

## 2015-10-13 DIAGNOSIS — F17200 Nicotine dependence, unspecified, uncomplicated: Secondary | ICD-10-CM

## 2015-10-13 DIAGNOSIS — M1A09X0 Idiopathic chronic gout, multiple sites, without tophus (tophi): Secondary | ICD-10-CM

## 2015-10-13 DIAGNOSIS — F1090 Alcohol use, unspecified, uncomplicated: Secondary | ICD-10-CM

## 2015-10-13 DIAGNOSIS — F329 Major depressive disorder, single episode, unspecified: Secondary | ICD-10-CM

## 2015-10-13 DIAGNOSIS — Z7289 Other problems related to lifestyle: Secondary | ICD-10-CM

## 2015-10-13 MED ORDER — DISULFIRAM 250 MG PO TABS: ORAL_TABLET | ORAL | 0 refills | Status: DC

## 2015-10-13 NOTE — Patient Instructions (Addendum)
Sign release for records of latest labs at occupational health.  Start antabuse 500mg  daily for 2 weeks then decrease to 250mg  daily.  Return in 3-4 wks for follow up visit.

## 2015-10-13 NOTE — Assessment & Plan Note (Addendum)
Doing well on allopurinol 100mg  daily. Aware of gout-alcohol relationship.

## 2015-10-13 NOTE — Assessment & Plan Note (Signed)
Stable. Continue paxil 40mg daily.  

## 2015-10-13 NOTE — Telephone Encounter (Signed)
Patient here for OV and advised at that time by Dr. Darnell Level.

## 2015-10-13 NOTE — Progress Notes (Signed)
Pre visit review using our clinic review tool, if applicable. No additional management support is needed unless otherwise documented below in the visit note. 

## 2015-10-13 NOTE — Assessment & Plan Note (Addendum)
Resolved as of last check last year.  With alcohol use, concern for alcoholic hepatitis.  Given current lack of insurance, will request recent labs done at OT (to see if LFTs checked). If not checked, will check next visit in 1 month. Pt agrees with plan.

## 2015-10-13 NOTE — Assessment & Plan Note (Addendum)
Currently has been drinking 6 beers + 1 pint liquor daily. Alcohol use affecting marital relationship. Motivated to fully quit again. No alcohol in last 2-3 days. Mild tachycardia noted today - likely from mild alcohol withdrawal. Denies h/o DT or significant alcohol withdrawal symptoms in the past.  Naltrexone ineffective for alcohol cessation. 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  Discussed health benefits of alcohol cessation. Discussed in detail reasons to seek ER care. Discussed antabuse use, small risk of liver failure, and red flags to notify us right away. Will prescribe antabuse 500mg  daily x 2 wks then decrease to 250mg  daily. RTC 3-4 wks f/u visit. Pt agrees with plan.

## 2015-10-13 NOTE — Assessment & Plan Note (Signed)
Continue to encourage cessation. One addiction at a time.

## 2015-10-13 NOTE — Progress Notes (Signed)
BP 106/78   Pulse (!) 106   Temp 98.9 F (37.2 C) (Oral)   Ht 5\' 10"  (1.778 m)   Wt 226 lb 8 oz (102.7 kg)   SpO2 94%   BMI 32.50 kg/m    CC: discuss alcohol use.  Subjective:    Patient ID: Daniel May, male    DOB: [birth year], 50 y.o.   MRN: [id number]  HPI: Daniel May is a 49 y.o. male presenting on 10/13/2015 for Discuss meds   H/o recurrent gout over the past year, was out of work for this. Denies recurrent gout trouble on allopurinol daily. Out of insurance currently. Changed jobs - now working at [employer]. Less stress, enjoys new job. Some marital stress over alcohol.   Here to discuss recurrent alcohol use. On and off alcohol abuse, quit for 3 yrs then 2 yrs later started drinking heavily again. Averages 1 pint liquor and 6 pack of beer daily. Did not do well with AA. We trialed naltrexone but it wasn't effective.   No alcohol over last 2-3 days. Denies h/o DT when he has quit drinking cold [place] in the past, no seizures, no tremor or sickness.  H/o transaminitis 2016, on retesting levels had returned to normal. He also had normal iron panel, viral hepatitis panel and blood counts. Did not complete abdominal ultrasound given normal repeat readings.   Relevant past medical, surgical, family and social history reviewed and updated as indicated. Interim medical history since our last visit reviewed. Allergies and medications reviewed and updated. Current Outpatient Prescriptions on File Prior to Visit  Medication Sig  . allopurinol (ZYLOPRIM) 100 MG tablet Take 1 tablet (100 mg total) by mouth daily.  Marland Kitchen losartan (COZAAR) 50 MG tablet Take 1 tablet (50 mg total) by mouth daily.  Marland Kitchen PARoxetine (PAXIL) 40 MG tablet Take 1 tablet (40 mg total) by mouth daily.  . Colchicine 0.6 MG CAPS Take 1 capsule by mouth daily as needed.   No current facility-administered medications on file prior to visit.     Review of Systems Per HPI unless specifically indicated in  ROS section     Objective:    BP 106/78   Pulse (!) 106   Temp 98.9 F (37.2 C) (Oral)   Ht 5\' 10"  (1.778 m)   Wt 226 lb 8 oz (102.7 kg)   SpO2 94%   BMI 32.50 kg/m   Wt Readings from Last 3 Encounters:  10/13/15 226 lb 8 oz (102.7 kg)  06/10/15 230 lb (104.3 kg)  06/02/15 225 lb 1.9 oz (102.1 kg)    Physical Exam  Constitutional: He appears well-developed and well-nourished. No distress.  HENT:  Mouth/Throat: Oropharynx is clear and moist. No oropharyngeal exudate.  Cardiovascular: Regular rhythm, normal heart sounds and intact distal pulses.  Tachycardia present.   No murmur heard. Pulmonary/Chest: Effort normal and breath sounds normal. No respiratory distress. He has no wheezes. He has no rales.  Abdominal: Soft. Normal appearance and bowel sounds are normal. He exhibits no distension and no mass. There is hepatomegaly (mild). There is no splenomegaly. There is no tenderness. There is no rigidity, no rebound, no guarding and negative Murphy's sign.  Musculoskeletal: He exhibits no edema.  Nursing note and vitals reviewed.  Results for orders placed or performed in visit on 05/13/15  CBC with Differential/Platelet  Result Value Ref Range   WBC 11.6 (H) 4.0 - 10.5 K/uL   RBC 4.93 4.22 - 5.81 Mil/uL   Hemoglobin  15.5 13.0 - 17.0 g/dL   HCT 45.2 39.0 - 52.0 %   MCV 91.7 78.0 - 100.0 fl   MCHC 34.4 30.0 - 36.0 g/dL   RDW 14.4 11.5 - 15.5 %   Platelets 234.0 150.0 - 400.0 K/uL   Neutrophils Relative % 68.3 43.0 - 77.0 %   Lymphocytes Relative 19.2 12.0 - 46.0 %   Monocytes Relative 9.9 3.0 - 12.0 %   Eosinophils Relative 2.3 0.0 - 5.0 %   Basophils Relative 0.3 0.0 - 3.0 %   Neutro Abs 7.9 (H) 1.4 - 7.7 K/uL   Lymphs Abs 2.2 0.7 - 4.0 K/uL   Monocytes Absolute 1.1 (H) 0.1 - 1.0 K/uL   Eosinophils Absolute 0.3 0.0 - 0.7 K/uL   Basophils Absolute 0.0 0.0 - 0.1 K/uL  Uric acid  Result Value Ref Range   Uric Acid, Serum 8.0 (H) 4.0 - 7.8 mg/dL  Sedimentation rate    Result Value Ref Range   Sed Rate 20 0 - 22 mm/hr   Lab Results  Component Value Date   ALT 21 09/10/2014   AST 21 09/10/2014   ALKPHOS 63 09/10/2014   BILITOT 0.4 09/10/2014       Assessment & Plan:   Problem List Items Addressed This Visit    Depression    Stable. Continue paxil 40mg  daily.       Gout    Doing well on allopurinol 100mg  daily. Aware of gout-alcohol relationship.      Habitual alcohol use - Primary    Currently has been drinking 6 beers + 1 pint liquor daily. Alcohol use affecting marital relationship. Motivated to fully quit again. No alcohol in last 2-3 days. Mild tachycardia noted today - likely from mild alcohol withdrawal. Denies h/o DT or significant alcohol withdrawal symptoms in the past.  Naltrexone ineffective for alcohol cessation. 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  Discussed health benefits of alcohol cessation. Discussed in detail reasons to seek ER care. Discussed antabuse use, small risk of liver failure, and red flags to notify us right away. Will prescribe antabuse 500mg  daily x 2 wks then decrease to 250mg  daily. RTC 3-4 wks f/u visit. Pt agrees with plan.      Smoker    Continue to encourage cessation. One addiction at a time.       Transaminitis    Resolved as of last check last year.  With alcohol use, concern for alcoholic hepatitis.  Given current lack of insurance, will request recent labs done at OT (to see if LFTs checked). If not checked, will check next visit in 1 month. Pt agrees with plan.       Other Visit Diagnoses   None.      Follow up plan: Return in about 4 weeks (around 11/10/2015) for follow up visit.  Ria Bush, MD

## 2015-12-27 ENCOUNTER — Other Ambulatory Visit: Payer: Self-pay | Admitting: Family Medicine

## 2015-12-28 ENCOUNTER — Other Ambulatory Visit: Payer: Self-pay | Admitting: Family Medicine

## 2015-12-29 MED ORDER — PAROXETINE HCL 40 MG PO TABS: 40.0000 mg | ORAL_TABLET | Freq: Every day | ORAL | 0 refills | Status: DC

## 2015-12-31 ENCOUNTER — Other Ambulatory Visit: Payer: Self-pay | Admitting: Family Medicine

## 2016-01-02 MED ORDER — PAROXETINE HCL 40 MG PO TABS: 40.0000 mg | ORAL_TABLET | Freq: Every day | ORAL | 0 refills | Status: DC

## 2016-01-02 NOTE — Addendum Note (Signed)
Addended by: Royann Shivers A on: 01/02/2016 09:33 AM   Modules accepted: Orders

## 2016-01-11 ENCOUNTER — Encounter: Payer: Self-pay | Admitting: Family Medicine

## 2016-03-12 ENCOUNTER — Encounter: Payer: Self-pay | Admitting: Family Medicine

## 2016-03-12 NOTE — Telephone Encounter (Signed)
I left a message for the pt to return my call. 

## 2016-03-12 NOTE — Telephone Encounter (Signed)
See pt email plz call pt and offer appt tomorrow morning with me at 10am or Rollene Fare who has some open slots in am

## 2016-03-12 NOTE — Telephone Encounter (Signed)
Patient scheduled appointment with Dr.Gutierrez on 03/13/16 at 11:15.

## 2016-03-13 ENCOUNTER — Encounter: Payer: Self-pay | Admitting: Family Medicine

## 2016-03-13 ENCOUNTER — Ambulatory Visit (INDEPENDENT_AMBULATORY_CARE_PROVIDER_SITE_OTHER): Payer: Managed Care, Other (non HMO) | Admitting: Family Medicine

## 2016-03-13 VITALS — BP 112/82 | HR 92 | Temp 98.1°F | Wt 224.0 lb

## 2016-03-13 DIAGNOSIS — Z7289 Other problems related to lifestyle: Secondary | ICD-10-CM

## 2016-03-13 DIAGNOSIS — F1010 Alcohol abuse, uncomplicated: Secondary | ICD-10-CM | POA: Diagnosis not present

## 2016-03-13 DIAGNOSIS — F1090 Alcohol use, unspecified, uncomplicated: Secondary | ICD-10-CM

## 2016-03-13 DIAGNOSIS — R197 Diarrhea, unspecified: Secondary | ICD-10-CM | POA: Diagnosis not present

## 2016-03-13 MED ORDER — ALLOPURINOL 100 MG PO TABS: 100.0000 mg | ORAL_TABLET | Freq: Every day | ORAL | 1 refills | Status: DC

## 2016-03-13 MED ORDER — PAROXETINE HCL 40 MG PO TABS
40.0000 mg | ORAL_TABLET | Freq: Every day | ORAL | 1 refills | Status: DC
Start: 2016-03-13 — End: 2016-08-24

## 2016-03-13 NOTE — Progress Notes (Signed)
BP 112/82 (BP Location: Left Arm, Patient Position: Sitting, Cuff Size: Normal)   Pulse 92   Temp 98.1 F (36.7 C) (Oral)   Wt 224 lb (101.6 kg)   SpO2 96%   BMI 32.14 kg/m    CC: gi illness Subjective:    Patient ID: Daniel May, male    DOB: 02/15/1965, 51 y.o.   MRN: [id number]  HPI: Daniel May is a 51 y.o. male presenting on 03/13/2016 for Follow-up (needs work note)   Diarrhea with several episodes per day late last week - quickly recovered.  Work requiring doctor note to return to work.  Denies fevers/chills, blood in stool.  No respiratory sxs.  No nausea/vomiting.  Diet back to normal.   Continued smoker 1ppd.  Alcohol - cut back on drinking - one drink in September. Was able to cut back with antabuse.   Relevant past medical, surgical, family and social history reviewed and updated as indicated. Interim medical history since our last visit reviewed. Allergies and medications reviewed and updated. Outpatient Medications Prior to Visit  Medication Sig Dispense Refill  . disulfiram (ANTABUSE) 250 MG tablet Take 2 pills daily for 2 weeks then 1 pill daily 60 tablet 0  . losartan (COZAAR) 50 MG tablet TAKE 1 TABLET BY MOUTH DAILY 90 tablet 1  . allopurinol (ZYLOPRIM) 100 MG tablet Take 1 tablet (100 mg total) by mouth daily. 30 tablet 6  . PARoxetine (PAXIL) 40 MG tablet Take 1 tablet (40 mg total) by mouth daily. 30 tablet 0  . Colchicine 0.6 MG CAPS Take 1 capsule by mouth daily as needed.     No facility-administered medications prior to visit.      Per HPI unless specifically indicated in ROS section below Review of Systems     Objective:    BP 112/82 (BP Location: Left Arm, Patient Position: Sitting, Cuff Size: Normal)   Pulse 92   Temp 98.1 F (36.7 C) (Oral)   Wt 224 lb (101.6 kg)   SpO2 96%   BMI 32.14 kg/m   Wt Readings from Last 3 Encounters:  03/13/16 224 lb (101.6 kg)  10/13/15 226 lb 8 oz (102.7 kg)  06/10/15 230 lb (104.3 kg)    Physical  Exam  Constitutional: He appears well-developed and well-nourished. No distress.  HENT:  Mouth/Throat: Oropharynx is clear and moist. No oropharyngeal exudate.  Cardiovascular: Normal rate, regular rhythm, normal heart sounds and intact distal pulses.   No murmur heard. Pulmonary/Chest: Effort normal and breath sounds normal. No respiratory distress. He has no wheezes. He has no rales.  Abdominal: Soft. Normal appearance and bowel sounds are normal. He exhibits no distension and no mass. There is no hepatosplenomegaly. There is no tenderness. There is no rigidity, no rebound, no guarding, no CVA tenderness and negative Murphy's sign.  Musculoskeletal: He exhibits no edema.  Skin: Skin is warm and dry. No rash noted.  Psychiatric: He has a normal mood and affect.  Nursing note and vitals reviewed.  Results for orders placed or performed in visit on 05/13/15  CBC with Differential/Platelet  Result Value Ref Range   WBC 11.6 (H) 4.0 - 10.5 K/uL   RBC 4.93 4.22 - 5.81 Mil/uL   Hemoglobin 15.5 13.0 - 17.0 g/dL   HCT 45.2 39.0 - 52.0 %   MCV 91.7 78.0 - 100.0 fl   MCHC 34.4 30.0 - 36.0 g/dL   RDW 14.4 11.5 - 15.5 %   Platelets 234.0 150.0 - 400.0 K/uL  Neutrophils Relative % 68.3 43.0 - 77.0 %   Lymphocytes Relative 19.2 12.0 - 46.0 %   Monocytes Relative 9.9 3.0 - 12.0 %   Eosinophils Relative 2.3 0.0 - 5.0 %   Basophils Relative 0.3 0.0 - 3.0 %   Neutro Abs 7.9 (H) 1.4 - 7.7 K/uL   Lymphs Abs 2.2 0.7 - 4.0 K/uL   Monocytes Absolute 1.1 (H) 0.1 - 1.0 K/uL   Eosinophils Absolute 0.3 0.0 - 0.7 K/uL   Basophils Absolute 0.0 0.0 - 0.1 K/uL  Uric acid  Result Value Ref Range   Uric Acid, Serum 8.0 (H) 4.0 - 7.8 mg/dL  Sedimentation rate  Result Value Ref Range   Sed Rate 20 0 - 22 mm/hr      Assessment & Plan:   Problem List Items Addressed This Visit    Diarrhea - Primary    Acute episode of self limited diarrheal illness anticipate viral. Now fully resolved. 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       Habitual alcohol use    Abstinent over last several months with antabuse assistance. Wife happy, pt satisfied. No cravings to restart drinking, but will let me know if this happens.  Prior was drinking up to 6 beers + 1 pint liquor daily.           Follow up plan: No Follow-up on file.  Ria Bush, MD

## 2016-03-13 NOTE — Assessment & Plan Note (Signed)
Abstinent over last several months with antabuse assistance. Wife happy, pt satisfied. No cravings to restart drinking, but will let me know if this happens.  Prior was drinking up to 6 beers + 1 pint liquor daily.

## 2016-03-13 NOTE — Assessment & Plan Note (Signed)
Acute episode of self limited diarrheal illness anticipate viral. Now fully resolved. 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

## 2016-03-13 NOTE — Patient Instructions (Signed)
Work note provided.

## 2016-03-13 NOTE — Progress Notes (Signed)
Pre visit review using our clinic review tool, if applicable. No additional management support is needed unless otherwise documented below in the visit note. 

## 2016-04-11 ENCOUNTER — Other Ambulatory Visit: Payer: Self-pay | Admitting: Family Medicine

## 2016-04-11 DIAGNOSIS — R7401 Elevation of levels of liver transaminase levels: Secondary | ICD-10-CM

## 2016-04-11 DIAGNOSIS — M1A09X0 Idiopathic chronic gout, multiple sites, without tophus (tophi): Secondary | ICD-10-CM

## 2016-04-11 DIAGNOSIS — E781 Pure hyperglyceridemia: Secondary | ICD-10-CM

## 2016-04-11 DIAGNOSIS — Z7289 Other problems related to lifestyle: Secondary | ICD-10-CM

## 2016-04-11 DIAGNOSIS — R740 Nonspecific elevation of levels of transaminase and lactic acid dehydrogenase [LDH]: Secondary | ICD-10-CM

## 2016-04-11 DIAGNOSIS — F1090 Alcohol use, unspecified, uncomplicated: Secondary | ICD-10-CM

## 2016-04-12 ENCOUNTER — Other Ambulatory Visit (INDEPENDENT_AMBULATORY_CARE_PROVIDER_SITE_OTHER): Payer: Managed Care, Other (non HMO)

## 2016-04-12 DIAGNOSIS — F1010 Alcohol abuse, uncomplicated: Secondary | ICD-10-CM

## 2016-04-12 DIAGNOSIS — Z7289 Other problems related to lifestyle: Secondary | ICD-10-CM

## 2016-04-12 DIAGNOSIS — F1090 Alcohol use, unspecified, uncomplicated: Secondary | ICD-10-CM

## 2016-04-12 DIAGNOSIS — E781 Pure hyperglyceridemia: Secondary | ICD-10-CM

## 2016-04-12 DIAGNOSIS — M1A09X0 Idiopathic chronic gout, multiple sites, without tophus (tophi): Secondary | ICD-10-CM | POA: Diagnosis not present

## 2016-04-13 LAB — LIPID PANEL
CHOLESTEROL: 243 mg/dL — AB (ref 0–200)
HDL: 55.1 mg/dL (ref >39.00)
LDL CALC: 172 mg/dL — AB (ref 0–99)
NonHDL: 187.8
TRIGLYCERIDES: 80 mg/dL (ref 0.0–149.0)
Total CHOL/HDL Ratio: 4
VLDL: 16 mg/dL (ref 0.0–40.0)

## 2016-04-13 LAB — URIC ACID: Uric Acid, Serum: 6.5 mg/dL (ref 4.0–7.8)

## 2016-04-13 LAB — COMPREHENSIVE METABOLIC PANEL
ALBUMIN: 4.6 g/dL (ref 3.5–5.2)
ALK PHOS: 68 U/L (ref 39–117)
ALT: 18 U/L (ref 0–53)
AST: 22 U/L (ref 0–37)
BUN: 12 mg/dL (ref 6–23)
CALCIUM: 9.7 mg/dL (ref 8.4–10.5)
CHLORIDE: 106 meq/L (ref 96–112)
CO2: 29 mEq/L (ref 19–32)
Creatinine, Ser: 0.99 mg/dL (ref 0.40–1.50)
GFR: 85.33 mL/min (ref >60.00)
Glucose, Bld: 88 mg/dL (ref 70–99)
POTASSIUM: 4.3 meq/L (ref 3.5–5.1)
Sodium: 141 mEq/L (ref 135–145)
TOTAL PROTEIN: 7.3 g/dL (ref 6.0–8.3)
Total Bilirubin: 0.5 mg/dL (ref 0.2–1.2)

## 2016-04-13 LAB — VITAMIN B12: VITAMIN B 12: 452 pg/mL (ref 211–911)

## 2016-04-13 LAB — FOLATE

## 2016-04-19 ENCOUNTER — Encounter: Payer: Managed Care, Other (non HMO) | Admitting: Family Medicine

## 2016-04-24 ENCOUNTER — Encounter: Payer: Self-pay | Admitting: *Deleted

## 2016-04-24 ENCOUNTER — Encounter: Payer: Self-pay | Admitting: Family Medicine

## 2016-04-24 ENCOUNTER — Ambulatory Visit (INDEPENDENT_AMBULATORY_CARE_PROVIDER_SITE_OTHER): Payer: Managed Care, Other (non HMO) | Admitting: Family Medicine

## 2016-04-24 VITALS — BP 136/86 | HR 108 | Temp 98.3°F | Wt 219.8 lb

## 2016-04-24 DIAGNOSIS — F1010 Alcohol abuse, uncomplicated: Secondary | ICD-10-CM

## 2016-04-24 DIAGNOSIS — F1090 Alcohol use, unspecified, uncomplicated: Secondary | ICD-10-CM

## 2016-04-24 DIAGNOSIS — F17200 Nicotine dependence, unspecified, uncomplicated: Secondary | ICD-10-CM

## 2016-04-24 DIAGNOSIS — J209 Acute bronchitis, unspecified: Secondary | ICD-10-CM

## 2016-04-24 DIAGNOSIS — Z7289 Other problems related to lifestyle: Secondary | ICD-10-CM

## 2016-04-24 MED ORDER — AZITHROMYCIN 250 MG PO TABS: ORAL_TABLET | ORAL | 0 refills | Status: DC

## 2016-04-24 MED ORDER — GUAIFENESIN-CODEINE 100-10 MG/5ML PO SYRP: 5.0000 mL | ORAL_SOLUTION | Freq: Two times a day (BID) | ORAL | 0 refills | Status: DC | PRN

## 2016-04-24 NOTE — Assessment & Plan Note (Signed)
Continue to encourage cessation. Discussed concerns with development of COPD.

## 2016-04-24 NOTE — Patient Instructions (Signed)
I think you have bronchitis - treat with zpack antibiotic and codeine cough syrup for night time cough.  Push fluids and rest Continue mucous relief medicine to break up mucous.  Let us know if not improving with treatment.

## 2016-04-24 NOTE — Progress Notes (Signed)
BP 136/86 (BP Location: Right Arm, Patient Position: Sitting, Cuff Size: Normal)   Pulse (!) 108   Temp 98.3 F (36.8 C) (Oral)   Wt 219 lb 12 oz (99.7 kg)   SpO2 98%   BMI 31.53 kg/m    CC: chest congestion, cough Subjective:    Patient ID: Daniel May, male    DOB: 1965-08-05, 51 y.o.   MRN: [id number]  HPI: Daniel May is a 51 y.o. male presenting on 04/24/2016 for Chest Congestion; Cough; and Nasal Congestion   1 wk h/o chest > head congestion, rhinorrhea. Mild headache and body aches. Cough keeping him up, worse when supine. Productive cough.   No fevers/chills, ear or tooth pain.   Treating with OTC mucinex mucous relief.  No sick contacts at home.  Smoker - 1 ppd.   Relevant past medical, surgical, family and social history reviewed and updated as indicated. Interim medical history since our last visit reviewed. Allergies and medications reviewed and updated. Outpatient Medications Prior to Visit  Medication Sig Dispense Refill  . allopurinol (ZYLOPRIM) 100 MG tablet Take 1 tablet (100 mg total) by mouth daily. 90 tablet 1  . losartan (COZAAR) 50 MG tablet TAKE 1 TABLET BY MOUTH DAILY 90 tablet 1  . PARoxetine (PAXIL) 40 MG tablet Take 1 tablet (40 mg total) by mouth daily. 90 tablet 1  . Colchicine 0.6 MG CAPS Take 1 capsule by mouth daily as needed.    . disulfiram (ANTABUSE) 250 MG tablet Take 2 pills daily for 2 weeks then 1 pill daily 60 tablet 0   No facility-administered medications prior to visit.      Per HPI unless specifically indicated in ROS section below Review of Systems     Objective:    BP 136/86 (BP Location: Right Arm, Patient Position: Sitting, Cuff Size: Normal)   Pulse (!) 108   Temp 98.3 F (36.8 C) (Oral)   Wt 219 lb 12 oz (99.7 kg)   SpO2 98%   BMI 31.53 kg/m   Wt Readings from Last 3 Encounters:  04/24/16 219 lb 12 oz (99.7 kg)  03/13/16 224 lb (101.6 kg)  10/13/15 226 lb 8 oz (102.7 kg)    Physical Exam  Constitutional: He  appears well-developed and well-nourished. No distress.  HENT:  Head: Normocephalic and atraumatic.  Right Ear: Hearing, tympanic membrane, external ear and ear canal normal.  Left Ear: Hearing, tympanic membrane, external ear and ear canal normal.  Nose: Mucosal edema (L nasal mucosal erythema and discharge, R mucosal edema with nasal passage narrowing) present. No rhinorrhea. Right sinus exhibits no maxillary sinus tenderness and no frontal sinus tenderness. Left sinus exhibits no maxillary sinus tenderness and no frontal sinus tenderness.  Mouth/Throat: Uvula is midline, oropharynx is clear and moist and mucous membranes are normal. No oropharyngeal exudate, posterior oropharyngeal edema, posterior oropharyngeal erythema or tonsillar abscesses.  Eyes: Conjunctivae and EOM are normal. Pupils are equal, round, and reactive to light. No scleral icterus.  Neck: Normal range of motion. Neck supple.  Cardiovascular: Normal rate, regular rhythm, normal heart sounds and intact distal pulses.   No murmur heard. Pulmonary/Chest: Effort normal. No respiratory distress. He has no decreased breath sounds. He has no wheezes. He has rhonchi (clear with deep cough). He has no rales.  Coarse breath sounds without wheezing  Lymphadenopathy:    He has no cervical adenopathy.  Skin: Skin is warm and dry. No rash noted.  Nursing note and vitals reviewed.  Assessment & Plan:   Problem List Items Addressed This Visit    Acute bronchitis - Primary    Given ongoing smoking and prolonged duration, fill with zpack sent to pharmacy. cheratussin for cough, continue mucinex.  Update if not improving as expected. Out of work from 3/28- 04/26/2016.       Habitual alcohol use    States he continues to abstain from alcohol.       Smoker    Continue to encourage cessation. Discussed concerns with development of COPD.          Follow up plan: Return if symptoms worsen or fail to improve.  Ria Bush, MD

## 2016-04-24 NOTE — Assessment & Plan Note (Signed)
States he continues to abstain from alcohol.

## 2016-04-24 NOTE — Progress Notes (Signed)
Pre visit review using our clinic review tool, if applicable. No additional management support is needed unless otherwise documented below in the visit note. 

## 2016-04-24 NOTE — Assessment & Plan Note (Signed)
Given ongoing smoking and prolonged duration, fill with zpack sent to pharmacy. cheratussin for cough, continue mucinex.  Update if not improving as expected. Out of work from 3/28- 04/26/2016.

## 2016-04-26 ENCOUNTER — Encounter: Payer: Managed Care, Other (non HMO) | Admitting: Family Medicine

## 2016-04-30 ENCOUNTER — Encounter: Payer: Self-pay | Admitting: Family Medicine

## 2016-04-30 ENCOUNTER — Encounter: Payer: Self-pay | Admitting: *Deleted

## 2016-04-30 ENCOUNTER — Ambulatory Visit (INDEPENDENT_AMBULATORY_CARE_PROVIDER_SITE_OTHER): Payer: Managed Care, Other (non HMO) | Admitting: Family Medicine

## 2016-04-30 VITALS — BP 112/74 | HR 96 | Temp 98.1°F | Ht 71.0 in | Wt 219.8 lb

## 2016-04-30 DIAGNOSIS — Z0000 Encounter for general adult medical examination without abnormal findings: Secondary | ICD-10-CM | POA: Diagnosis not present

## 2016-04-30 DIAGNOSIS — M1A09X0 Idiopathic chronic gout, multiple sites, without tophus (tophi): Secondary | ICD-10-CM | POA: Diagnosis not present

## 2016-04-30 DIAGNOSIS — I10 Essential (primary) hypertension: Secondary | ICD-10-CM | POA: Diagnosis not present

## 2016-04-30 DIAGNOSIS — E785 Hyperlipidemia, unspecified: Secondary | ICD-10-CM

## 2016-04-30 DIAGNOSIS — R740 Nonspecific elevation of levels of transaminase and lactic acid dehydrogenase [LDH]: Secondary | ICD-10-CM | POA: Diagnosis not present

## 2016-04-30 DIAGNOSIS — Z7289 Other problems related to lifestyle: Secondary | ICD-10-CM

## 2016-04-30 DIAGNOSIS — F1010 Alcohol abuse, uncomplicated: Secondary | ICD-10-CM

## 2016-04-30 DIAGNOSIS — R591 Generalized enlarged lymph nodes: Secondary | ICD-10-CM | POA: Diagnosis not present

## 2016-04-30 DIAGNOSIS — R7401 Elevation of levels of liver transaminase levels: Secondary | ICD-10-CM

## 2016-04-30 DIAGNOSIS — J209 Acute bronchitis, unspecified: Secondary | ICD-10-CM | POA: Diagnosis not present

## 2016-04-30 DIAGNOSIS — F3289 Other specified depressive episodes: Secondary | ICD-10-CM | POA: Diagnosis not present

## 2016-04-30 DIAGNOSIS — F1090 Alcohol use, unspecified, uncomplicated: Secondary | ICD-10-CM

## 2016-04-30 DIAGNOSIS — F17200 Nicotine dependence, unspecified, uncomplicated: Secondary | ICD-10-CM

## 2016-04-30 MED ORDER — PREDNISONE 20 MG PO TABS: ORAL_TABLET | ORAL | 0 refills | Status: DC

## 2016-04-30 MED ORDER — BENZONATATE 100 MG PO CAPS: 100.0000 mg | ORAL_CAPSULE | Freq: Three times a day (TID) | ORAL | 0 refills | Status: DC | PRN

## 2016-04-30 NOTE — Assessment & Plan Note (Signed)
Stable period on paxil 40mg  - continue.

## 2016-04-30 NOTE — Assessment & Plan Note (Signed)
Largely resolved, however patient states he has stayed out of work due to persistent malaise and dyspnea. Will extend out of work through Wednesday - to get CT scan above scheduled tomorrow. Rx tessalon perls for cough, Rx predisone course for post-bronchitis inflammation of lungs. No wheezing appreciated today.

## 2016-04-30 NOTE — Assessment & Plan Note (Signed)
Continue to encourage cessation. precontemplative. 

## 2016-04-30 NOTE — Assessment & Plan Note (Signed)
Chronic, stable. Continue losartan 50mg daily.  

## 2016-04-30 NOTE — Assessment & Plan Note (Signed)
Anticipate largely alcohol related as he dropped urate levels to 6.5 off alcohol even while off allopurinol.

## 2016-04-30 NOTE — Assessment & Plan Note (Signed)
?  related to poor dentition - however pt endorses present over the past 2 yrs - in smoker and h/o alcohol use will order CT neck with contrast to further evaluate this. Pt agrees with plan.

## 2016-04-30 NOTE — Assessment & Plan Note (Signed)
Anticipate alcohol related - recent LFTs WNL. Congratulated.

## 2016-04-30 NOTE — Patient Instructions (Addendum)
Lungs sound good today. Use tessalon perls for cough, may also use over the counter robitussin during the day.  Short prednisone taper to help with any residual inflammation in lungs.  We will order neck CT to further evaluate right sided lymph nodes. See Rosaria Ferries on your way out.  Ok to return to work on Wednesday.   Health Maintenance, Male A healthy lifestyle and preventive care is important for your health and wellness. Ask your health care provider about what schedule of regular examinations is right for you. What should I know about weight and diet?  Eat a Healthy Diet  Eat plenty of vegetables, fruits, whole grains, low-fat dairy products, and lean protein.  Do not eat a lot of foods high in solid fats, added sugars, or salt. Maintain a Healthy Weight  Regular exercise can help you achieve or maintain a healthy weight. You should:  Do at least 150 minutes of exercise each week. The exercise should increase your heart rate and make you sweat (moderate-intensity exercise).  Do strength-training exercises at least twice a week. Watch Your Levels of Cholesterol and Blood Lipids  Have your blood tested for lipids and cholesterol every 5 years starting at 49 years of age. If you are at high risk for heart disease, you should start having your blood tested when you are 49 years old. You may need to have your cholesterol levels checked more often if:  Your lipid or cholesterol levels are high.  You are older than 49 years of age.  You are at high risk for heart disease. What should I know about cancer screening? Many types of cancers can be detected early and may often be prevented. Lung Cancer  You should be screened every year for lung cancer if:  You are a current smoker who has smoked for at least 30 years.  You are a former smoker who has quit within the past 15 years.  Talk to your health care provider about your screening options, when you should start screening, and how  often you should be screened. Colorectal Cancer  Routine colorectal cancer screening usually begins at 49 years of age and should be repeated every 5-10 years until you are 49 years old. You may need to be screened more often if early forms of precancerous polyps or small growths are found. Your health care provider may recommend screening at an earlier age if you have risk factors for colon cancer.  Your health care provider may recommend using home test kits to check for hidden blood in the stool.  A small camera at the end of a tube can be used to examine your colon (sigmoidoscopy or colonoscopy). This checks for the earliest forms of colorectal cancer. Prostate and Testicular Cancer  Depending on your age and overall health, your health care provider may do certain tests to screen for prostate and testicular cancer.  Talk to your health care provider about any symptoms or concerns you have about testicular or prostate cancer. Skin Cancer  Check your skin from head to toe regularly.  Tell your health care provider about any new moles or changes in moles, especially if:  There is a change in a mole's size, shape, or color.  You have a mole that is larger than a pencil eraser.  Always use sunscreen. Apply sunscreen liberally and repeat throughout the day.  Protect yourself by wearing long sleeves, pants, a wide-brimmed hat, and sunglasses when outside. What should I know about heart disease, diabetes,  and high blood pressure?  If you are 60-67 years of age, have your blood pressure checked every 3-5 years. If you are 52 years of age or older, have your blood pressure checked every year. You should have your blood pressure measured twice-once when you are at a hospital or clinic, and once when you are not at a hospital or clinic. Record the average of the two measurements. To check your blood pressure when you are not at a hospital or clinic, you can use:  An automated blood pressure  machine at a pharmacy.  A home blood pressure monitor.  Talk to your health care provider about your target blood pressure.  If you are between 33-97 years old, ask your health care provider if you should take aspirin to prevent heart disease.  Have regular diabetes screenings by checking your fasting blood sugar level.  If you are at a normal weight and have a low risk for diabetes, have this test once every three years after the age of 53.  If you are overweight and have a high risk for diabetes, consider being tested at a younger age or more often.  A one-time screening for abdominal aortic aneurysm (AAA) by ultrasound is recommended for men aged 79-75 years who are current or former smokers. What should I know about preventing infection? Hepatitis B  If you have a higher risk for hepatitis B, you should be screened for this virus. Talk with your health care provider to find out if you are at risk for hepatitis B infection. Hepatitis C  Blood testing is recommended for:  Everyone born from [birth year] through 1963.  Anyone with known risk factors for hepatitis C. Sexually Transmitted Diseases (STDs)  You should be screened each year for STDs including gonorrhea and chlamydia if:  You are sexually active and are younger than 49 years of age.  You are older than 49 years of age and your health care provider tells you that you are at risk for this type of infection.  Your sexual activity has changed since you were last screened and you are at an increased risk for chlamydia or gonorrhea. Ask your health care provider if you are at risk.  Talk with your health care provider about whether you are at high risk of being infected with HIV. Your health care provider may recommend a prescription medicine to help prevent HIV infection. What else can I do?  Schedule regular health, dental, and eye exams.  Stay current with your vaccines (immunizations).  Do not use any tobacco products, such  as cigarettes, chewing tobacco, and e-cigarettes. If you need help quitting, ask your health care provider.  Limit alcohol intake to no more than 2 drinks per day. One drink equals 12 ounces of beer, 5 ounces of wine, or 1 ounces of hard liquor.  Do not use street drugs.  Do not share needles.  Ask your health care provider for help if you need support or information about quitting drugs.  Tell your health care provider if you often feel depressed.  Tell your health care provider if you have ever been abused or do not feel safe at home. This information is not intended to replace advice given to you by your health care provider. Make sure you discuss any questions you have with your health care provider. Document Released: 07/07/2007 Document Revised: 09/07/2015 Document Reviewed: 10/12/2014 Elsevier Interactive Patient Education  2017 [employer].

## 2016-04-30 NOTE — Assessment & Plan Note (Addendum)
Continues to abstain from alcohol. Congratulated on this

## 2016-04-30 NOTE — Progress Notes (Signed)
BP 112/74   Pulse 96   Temp 98.1 F (36.7 C) (Oral)   Ht 5\' 11"  (1.803 m)   Wt 219 lb 12 oz (99.7 kg)   SpO2 97%   BMI 30.65 kg/m    CC: CPE Subjective:    Patient ID: Daniel May, male    DOB: 11-19-1965, 51 y.o.   MRN: [id number]  HPI: Daniel May is a 51 y.o. male presenting on 04/30/2016 for Annual Exam   Acute bronchitis seen 04/24/2016. Has remained out of work. We wrote for him to be out of work 3/28-04/26/2016. Treated with zpack. Never picked up codeine cough syrup because it had controlled substance which he wanted to avoid.   Endorses several year history of R cervical lymphadenopathy without other significant symptoms. He does endorse occasional sore throat. He is a long time smoker and habitual alcohol user - quit 2017.  Preventative: Flu shot - doesn't get Tetanus - 2016 Seat belt use discussed Sunscreen use discussed. No changing moles. Smoking - ongoing, but trying to cut down. 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 Interim medical history since our last visit reviewed. Allergies and medications reviewed and updated. Outpatient Medications Prior to Visit  Medication Sig Dispense Refill  . allopurinol (ZYLOPRIM) 100 MG tablet Take 1 tablet (100 mg total) by mouth daily. 90 tablet 1  . losartan (COZAAR) 50 MG tablet TAKE 1 TABLET BY MOUTH DAILY 90 tablet 1  . PARoxetine (PAXIL) 40 MG tablet Take 1 tablet (40 mg total) by mouth daily. 90 tablet 1  . azithromycin (ZITHROMAX) 250 MG tablet Take two tablets on day one followed by one tablet on days 2-5 6 each 0  . guaiFENesin-codeine (CHERATUSSIN AC) 100-10 MG/5ML syrup Take 5 mLs by mouth 2 (two) times daily as needed for cough (sedation  precautions). (Patient not taking: Reported on 04/30/2016) 140 mL 0   No facility-administered medications prior to visit.      Per HPI unless specifically indicated in ROS section below Review of Systems  Constitutional: Negative for activity change, appetite change, chills, fatigue, fever and unexpected weight change.  HENT: Positive for congestion. Negative for hearing loss.   Eyes: Negative for visual disturbance.  Respiratory: Positive for cough and shortness of breath. Negative for chest tightness and wheezing.   Cardiovascular: Negative for chest pain, palpitations and leg swelling.  Gastrointestinal: Negative for abdominal distention, abdominal pain, blood in stool, constipation, diarrhea, nausea and vomiting.  Genitourinary: Negative for difficulty urinating and hematuria.  Musculoskeletal: Negative for arthralgias, myalgias and neck pain.  Skin: Negative for rash.  Neurological: Positive for headaches. Negative for dizziness, seizures and syncope.  Hematological: Negative for adenopathy. Does not bruise/bleed easily.  Psychiatric/Behavioral: Negative for dysphoric mood. The patient is not nervous/anxious.        Objective:    BP 112/74   Pulse 96   Temp 98.1 F (36.7 C) (Oral)   Ht 5\' 11"  (1.803 m)   Wt 219 lb 12 oz (99.7 kg)   SpO2 97%   BMI 30.65 kg/m   Wt Readings from Last 3 Encounters:  04/30/16 219 lb 12 oz (99.7 kg)  04/24/16 219 lb 12 oz (99.7 kg)  03/13/16 224 lb (101.6 kg)  Physical Exam  Constitutional: He is oriented to person, place, and time. He appears well-developed and well-nourished. No distress.  HENT:  Head: Normocephalic and atraumatic.  Right Ear: Hearing, external ear and ear canal normal.  Left Ear: Hearing, external ear and ear canal normal.  Nose: Nose normal.  Mouth/Throat: Uvula is midline and mucous membranes are normal. Abnormal dentition. Posterior oropharyngeal erythema present. No oropharyngeal exudate or posterior oropharyngeal  edema.  Oropharyngeal erythema without obvious mass or lesions appreciated  Eyes: Conjunctivae and EOM are normal. Pupils are equal, round, and reactive to light. No scleral icterus.  Neck: Normal range of motion. Neck supple. No thyromegaly present.  Cardiovascular: Normal rate, regular rhythm, normal heart sounds and intact distal pulses.   No murmur heard. Pulses:      Radial pulses are 2+ on the right side, and 2+ on the left side.  Pulmonary/Chest: Effort normal and breath sounds normal. No respiratory distress. He has no wheezes. He has no rales.  Abdominal: Soft. Bowel sounds are normal. He exhibits no distension and no mass. There is no tenderness. There is no rebound and no guarding.  Musculoskeletal: Normal range of motion. He exhibits no edema.  Lymphadenopathy:       Head (right side): No submental, no submandibular, no tonsillar, no preauricular, no posterior auricular and no occipital adenopathy present.       Head (left side): No submental, no submandibular, no tonsillar, no preauricular, no posterior auricular and no occipital adenopathy present.    He has cervical adenopathy.       Right cervical: Deep cervical and posterior cervical adenopathy present. No superficial cervical adenopathy present.      Left cervical: No superficial cervical, no deep cervical and no posterior cervical adenopathy present.    He has no axillary adenopathy.       Right axillary: No lateral adenopathy present.       Left axillary: No lateral adenopathy present.      Right: No supraclavicular adenopathy present.       Left: No supraclavicular adenopathy present.  Firm ~1cm LAD R posterior and deep cervical chains, without fluctuance or significant tenderness  Neurological: He is alert and oriented to person, place, and time.  CN grossly intact, station and gait intact  Skin: Skin is warm and dry. No rash noted.  Psychiatric: He has a normal mood and affect. His behavior is normal. Judgment and  thought content normal.  Nursing note and vitals reviewed.  Results for orders placed or performed in visit on 04/12/16  Uric acid  Result Value Ref Range   Uric Acid, Serum 6.5 4.0 - 7.8 mg/dL  Lipid panel  Result Value Ref Range   Cholesterol 243 (H) 0 - 200 mg/dL   Triglycerides 80.0 0.0 - 149.0 mg/dL   HDL 55.10 >39.00 mg/dL   VLDL 16.0 0.0 - 40.0 mg/dL   LDL Cholesterol 172 (H) 0 - 99 mg/dL   Total CHOL/HDL Ratio 4    NonHDL 187.80   Comprehensive metabolic panel  Result Value Ref Range   Sodium 141 135 - 145 mEq/L   Potassium 4.3 3.5 - 5.1 mEq/L   Chloride 106 96 - 112 mEq/L   CO2 29 19 - 32 mEq/L   Glucose, Bld 88 70 - 99 mg/dL   BUN 12 6 - 23 mg/dL   Creatinine, Ser 0.99 0.40 - 1.50 mg/dL   Total Bilirubin 0.5 0.2 - 1.2 mg/dL   Alkaline Phosphatase 68 39 - 117 U/L  AST 22 0 - 37 U/L   ALT 18 0 - 53 U/L   Total Protein 7.3 6.0 - 8.3 g/dL   Albumin 4.6 3.5 - 5.2 g/dL   Calcium 9.7 8.4 - 10.5 mg/dL   GFR 85.33 >60.00 mL/min  Folate  Result Value Ref Range   Folate >23.5 >5.9 ng/mL  Vitamin B12  Result Value Ref Range   Vitamin B-12 452 211 - 911 pg/mL      Assessment & Plan:   Problem List Items Addressed This Visit    Acute bronchitis    Largely resolved, however patient states he has stayed out of work due to persistent malaise and dyspnea. Will extend out of work through Wednesday - to get CT scan above scheduled tomorrow. Rx tessalon perls for cough, Rx predisone course for post-bronchitis inflammation of lungs. No wheezing appreciated today.       Depression    Stable period on paxil 40mg  - continue.       Dyslipidemia    Hypertriglyceridemia markedly improved since alcohol cessation - congratulated. LDL now high - rec increased legumes.       Gout    Anticipate largely alcohol related as he dropped urate levels to 6.5 off alcohol even while off allopurinol.       Habitual alcohol use    Continues to abstain from alcohol. Congratulated on  this      Head and neck lymphadenopathy    ?related to poor dentition - however pt endorses present over the past 2 yrs - in smoker and h/o alcohol use will order CT neck with contrast to further evaluate this. Pt agrees with plan.      Relevant Orders   CT Soft Tissue Neck W Contrast   Health maintenance examination - Primary    Preventative protocols reviewed and updated unless pt declined. Discussed healthy diet and lifestyle.       HTN (hypertension)    Chronic, stable. Continue losartan 50mg  daily.       Smoker    Continue to encourage cessation. precontemplative.      Transaminitis    Anticipate alcohol related - recent LFTs WNL. Congratulated.           Follow up plan: Return if symptoms worsen or fail to improve.  Ria Bush, MD

## 2016-04-30 NOTE — Assessment & Plan Note (Signed)
Hypertriglyceridemia markedly improved since alcohol cessation - congratulated. LDL now high - rec increased legumes.

## 2016-04-30 NOTE — Assessment & Plan Note (Signed)
Preventative protocols reviewed and updated unless pt declined. Discussed healthy diet and lifestyle.  

## 2016-04-30 NOTE — Progress Notes (Signed)
Pre visit review using our clinic review tool, if applicable. No additional management support is needed unless otherwise documented below in the visit note. 

## 2016-05-01 ENCOUNTER — Ambulatory Visit (HOSPITAL_COMMUNITY): Payer: Managed Care, Other (non HMO)

## 2016-05-01 ENCOUNTER — Ambulatory Visit (HOSPITAL_COMMUNITY): Admission: RE | Admit: 2016-05-01 | Payer: Managed Care, Other (non HMO) | Source: Ambulatory Visit

## 2016-05-03 ENCOUNTER — Telehealth: Payer: Self-pay | Admitting: Family Medicine

## 2016-05-03 ENCOUNTER — Encounter: Payer: Self-pay | Admitting: Family Medicine

## 2016-05-03 NOTE — Telephone Encounter (Signed)
Patient returned Dr.G's call.  Patient can be reached at [phone].  Patient said he'll carry his phone with him.  I let patient know the CT was reassuringly ok.

## 2016-05-03 NOTE — Telephone Encounter (Signed)
Called and left message on answering machine - asked to call us back. CT reassuringly ok. Wanted to touch base with patient.

## 2016-05-03 NOTE — Telephone Encounter (Signed)
Called again - to voicemail.

## 2016-05-04 NOTE — Telephone Encounter (Signed)
Spoke with patient, discussed benign results.

## 2016-05-04 NOTE — Telephone Encounter (Signed)
PT  Returned PCP's call.  Best number to reach is [phone]

## 2016-05-27 ENCOUNTER — Encounter: Payer: Self-pay | Admitting: Family Medicine

## 2016-05-28 NOTE — Telephone Encounter (Signed)
Last office visit 04/30/2016.  Not on current medication list.  Refill?

## 2016-05-30 MED ORDER — DISULFIRAM 250 MG PO TABS: ORAL_TABLET | ORAL | 0 refills | Status: DC

## 2016-05-30 NOTE — Telephone Encounter (Signed)
Pt left v/m to ck status of disulfiram to [facility]. Per DPR left v/m what email to pt said.

## 2016-08-01 ENCOUNTER — Other Ambulatory Visit: Payer: Self-pay | Admitting: Family Medicine

## 2016-08-24 ENCOUNTER — Other Ambulatory Visit: Payer: Self-pay | Admitting: Family Medicine

## 2016-11-12 ENCOUNTER — Ambulatory Visit: Payer: Self-pay | Admitting: Family Medicine

## 2016-11-15 ENCOUNTER — Telehealth: Payer: Self-pay | Admitting: Family Medicine

## 2016-11-15 NOTE — Telephone Encounter (Signed)
ok 

## 2016-11-15 NOTE — Telephone Encounter (Signed)
Copied from [place] [id number]. Topic: Quick Communication - Appointment Cancellation >> Nov 12, 2016  6:50 AM Pricilla Handler wrote: Patient called to cancel appointment scheduled for 11/12/2016 at 2:00pm. Patient HAS NOT rescheduled their appointment.   Route to department's PEC pool.

## 2016-12-08 ENCOUNTER — Other Ambulatory Visit: Payer: Self-pay | Admitting: Family Medicine

## 2017-01-16 ENCOUNTER — Other Ambulatory Visit: Payer: Self-pay | Admitting: Family Medicine

## 2017-01-17 ENCOUNTER — Other Ambulatory Visit: Payer: Self-pay | Admitting: Family Medicine

## 2017-02-12 ENCOUNTER — Encounter: Payer: Self-pay | Admitting: Family Medicine

## 2017-02-12 ENCOUNTER — Ambulatory Visit (INDEPENDENT_AMBULATORY_CARE_PROVIDER_SITE_OTHER): Payer: BLUE CROSS/BLUE SHIELD | Admitting: Family Medicine

## 2017-02-12 VITALS — BP 120/64 | HR 118 | Temp 98.2°F | Wt 232.0 lb

## 2017-02-12 DIAGNOSIS — F17200 Nicotine dependence, unspecified, uncomplicated: Secondary | ICD-10-CM

## 2017-02-12 DIAGNOSIS — K089 Disorder of teeth and supporting structures, unspecified: Secondary | ICD-10-CM | POA: Diagnosis not present

## 2017-02-12 DIAGNOSIS — A084 Viral intestinal infection, unspecified: Secondary | ICD-10-CM | POA: Diagnosis not present

## 2017-02-12 DIAGNOSIS — F1011 Alcohol abuse, in remission: Secondary | ICD-10-CM

## 2017-02-12 DIAGNOSIS — Z87898 Personal history of other specified conditions: Secondary | ICD-10-CM

## 2017-02-12 MED ORDER — VARENICLINE TARTRATE 1 MG PO TABS: 1.0000 mg | ORAL_TABLET | Freq: Two times a day (BID) | ORAL | 0 refills | Status: DC

## 2017-02-12 NOTE — Assessment & Plan Note (Signed)
Relapsed last year, s/p rehab through [facility] 09/2016. Doing well, abstinent since.

## 2017-02-12 NOTE — Patient Instructions (Addendum)
I think you have viral stomach flu.  Push fluids and rest, bland diet over next few days and slowly advance as tolerated.  Lots of hand washing.  If not improving over next 2-3 days, return for stool studies.   Viral Gastroenteritis, Adult Viral gastroenteritis is also known as the stomach flu. This condition is caused by various viruses. These viruses can be passed from person to person very easily (are very contagious). This condition may affect your stomach, small intestine, and large intestine. It can cause sudden watery diarrhea, fever, and vomiting. Diarrhea and vomiting can make you feel weak and cause you to become dehydrated. You may not be able to keep fluids down. Dehydration can make you tired and thirsty, cause you to have a dry mouth, and decrease how often you urinate. Older adults and people with other diseases or a weak immune system are at higher risk for dehydration. It is important to replace the fluids that you lose from diarrhea and vomiting. If you become severely dehydrated, you may need to get fluids through an IV tube. What are the causes? Gastroenteritis is caused by various viruses, including rotavirus and norovirus. Norovirus is the most common cause in adults. You can get sick by eating food, drinking water, or touching a surface contaminated with one of these viruses. You can also get sick from sharing utensils or other personal items with an infected person. What increases the risk? This condition is more likely to develop in people:  Who have a weak defense system (immune system).  Who live with one or more children who are younger than 31 years old.  Who live in a nursing home.  Who go on cruise ships.  What are the signs or symptoms? Symptoms of this condition start suddenly 1-2 days after exposure to a virus. Symptoms may last a few days or as long as a week. The most common symptoms are watery diarrhea and vomiting. Other symptoms  include:  Fever.  Headache.  Fatigue.  Pain in the abdomen.  Chills.  Weakness.  Nausea.  Muscle aches.  Loss of appetite.  How is this diagnosed? This condition is diagnosed with a medical history and physical exam. You may also have a stool test to check for viruses or other infections. How is this treated? This condition typically goes away on its own. The focus of treatment is to restore lost fluids (rehydration). Your health care provider may recommend that you take an oral rehydration solution (ORS) to replace important salts and minerals (electrolytes) in your body. Severe cases of this condition may require giving fluids through an IV tube. Treatment may also include medicine to help with your symptoms. Follow these instructions at home: Follow instructions from your health care provider about how to care for yourself at home. Eating and drinking Follow these recommendations as told by your health care provider:  Take an ORS. This is a drink that is sold at pharmacies and retail stores.  Drink clear fluids in small amounts as you are able. Clear fluids include water, ice chips, diluted fruit juice, and low-calorie sports drinks.  Eat bland, easy-to-digest foods in small amounts as you are able. These foods include bananas, applesauce, rice, lean meats, toast, and crackers.  Avoid fluids that contain a lot of sugar or caffeine, such as energy drinks, sports drinks, and soda.  Avoid alcohol.  Avoid spicy or fatty foods.  General instructions   Drink enough fluid to keep your urine clear or pale yellow.  Wash your hands often. If soap and water are not available, use hand sanitizer.  Make sure that all people in your household wash their hands well and often.  Take over-the-counter and prescription medicines only as told by your health care provider.  Rest at home while you recover.  Watch your condition for any changes.  Take a warm bath to relieve any  burning or pain from frequent diarrhea episodes.  Keep all follow-up visits as told by your health care provider. This is important. Contact a health care provider if:  You cannot keep fluids down.  Your symptoms get worse.  You have new symptoms.  You feel light-headed or dizzy.  You have muscle cramps. Get help right away if:  You have chest pain.  You feel extremely weak or you faint.  You see blood in your vomit.  Your vomit looks like coffee grounds.  You have bloody or black stools or stools that look like tar.  You have a severe headache, a stiff neck, or both.  You have a rash.  You have severe pain, cramping, or bloating in your abdomen.  You have trouble breathing or you are breathing very quickly.  Your heart is beating very quickly.  Your skin feels cold and clammy.  You feel confused.  You have pain when you urinate.  You have signs of dehydration, such as: ? Dark urine, very little urine, or no urine. ? Cracked lips. ? Dry mouth. ? Sunken eyes. ? Sleepiness. ? Weakness. This information is not intended to replace advice given to you by your health care provider. Make sure you discuss any questions you have with your health care provider. Document Released: 01/08/2005 Document Revised: 06/22/2015 Document Reviewed: 09/14/2014 Elsevier Interactive Patient Education  [employer].

## 2017-02-12 NOTE — Progress Notes (Signed)
BP 120/64 (BP Location: Left Arm, Patient Position: Sitting, Cuff Size: Normal)   Pulse (!) 118   Temp 98.2 F (36.8 C) (Oral)   Wt 232 lb (105.2 kg)   SpO2 97%   BMI 32.36 kg/m    CC: GI sxs, headache  Subjective:    Patient ID: Daniel May, male    DOB: March 28, 1965, 52 y.o.   MRN: [id number]  HPI: Daniel May is a 52 y.o. male presenting on 02/12/2017 for Headache (Started about 5 days ago. Tried ASA, ibuprofen and Tylenol with no help. ) and GI Problem (Has diarrhea after eating. Had vomiting on 02/09/17, none now. Tried Kaopectate. )   Almost 1 wk h/o ST with progression to headache and nausea/vomiting that has progressed to persistent watery diarrhea. Diarrhea x5-6 per day. Tmax 100. HA described as bitemporal ache - tried tylenol, aspirin, ibuprofen without improvement. Some cough and chest congestion that has improved. Slight tooth ache - upcoming appt with dentist.   No sick contacts at home.  Denies ear pain, PNdrainage, dyspnea or wheezing, palpitations, abd pain. No urinary symptoms.  No recent travel. No new restaurants. Uses city water.  Did not receive flu shot.   Requests chantix continuing dose x the next month. Down to 3-4 cig/day.   Relevant past medical, surgical, family and social history reviewed and updated as indicated. Interim medical history since our last visit reviewed. Allergies and medications reviewed and updated. Outpatient Medications Prior to Visit  Medication Sig Dispense Refill  . allopurinol (ZYLOPRIM) 100 MG tablet TAKE 1 TABLET BY MOUTH ONCE DAILY 90 tablet 1  . losartan (COZAAR) 50 MG tablet TAKE ONE TABLET BY MOUTH ONCE DAILY 90 tablet 1  . Multiple Vitamins-Minerals (MULTIVITAMIN ADULT PO) Take by mouth.    . Omega-3 Fatty Acids (FISH OIL) 1200 MG CAPS Take 2 capsules by mouth daily.    Marland Kitchen PARoxetine (PAXIL) 40 MG tablet TAKE 1 TABLET BY MOUTH ONCE DAILY 90 tablet 1  . vitamin C (ASCORBIC ACID) 500 MG tablet Take 500 mg by mouth daily.    Marland Kitchen  losartan (COZAAR) 50 MG tablet TAKE 1 TABLET BY MOUTH ONCE DAILY 90 tablet 0  . benzonatate (TESSALON PERLES) 100 MG capsule Take 1 capsule (100 mg total) by mouth 3 (three) times daily as needed for cough. 30 capsule 0  . disulfiram (ANTABUSE) 250 MG tablet Take 2 pills daily for 2 weeks then 1 pill daily 90 tablet 0  . predniSONE (DELTASONE) 20 MG tablet Take two tablets daily for 3 days followed by one tablet daily for 4 days 10 tablet 0   No facility-administered medications prior to visit.      Per HPI unless specifically indicated in ROS section below Review of Systems     Objective:    BP 120/64 (BP Location: Left Arm, Patient Position: Sitting, Cuff Size: Normal)   Pulse (!) 118   Temp 98.2 F (36.8 C) (Oral)   Wt 232 lb (105.2 kg)   SpO2 97%   BMI 32.36 kg/m   Wt Readings from Last 3 Encounters:  02/12/17 232 lb (105.2 kg)  04/30/16 219 lb 12 oz (99.7 kg)  04/24/16 219 lb 12 oz (99.7 kg)    Physical Exam  Constitutional: He appears well-developed and well-nourished. No distress.  HENT:  Head: Normocephalic and atraumatic.  Right Ear: Hearing, tympanic membrane, external ear and ear canal normal.  Left Ear: Hearing, tympanic membrane, external ear and ear canal normal.  Nose: Nose normal.  No mucosal edema or rhinorrhea. Right sinus exhibits no maxillary sinus tenderness and no frontal sinus tenderness. Left sinus exhibits no maxillary sinus tenderness and no frontal sinus tenderness.  Mouth/Throat: Uvula is midline, oropharynx is clear and moist and mucous membranes are normal. No oropharyngeal exudate, posterior oropharyngeal edema, posterior oropharyngeal erythema or tonsillar abscesses.  Eyes: Conjunctivae and EOM are normal. Pupils are equal, round, and reactive to light. No scleral icterus.  Neck: Normal range of motion. Neck supple.  Cardiovascular: Normal rate, regular rhythm, normal heart sounds and intact distal pulses.  No murmur heard. Pulmonary/Chest: Effort  normal and breath sounds normal. No respiratory distress. He has no wheezes. He has no rales.  Abdominal: Soft. Normal appearance. He exhibits no distension and no mass. Bowel sounds are increased. There is no hepatosplenomegaly. There is no tenderness. There is no rigidity, no rebound, no guarding, no CVA tenderness and negative Murphy's sign.  Lymphadenopathy:    He has no cervical adenopathy.  Skin: Skin is warm and dry. No rash noted.  Psychiatric: He has a normal mood and affect.  Nursing note and vitals reviewed.      Assessment & Plan:   Problem List Items Addressed This Visit    History of alcohol abuse    Relapsed last year, s/p rehab [facility] 09/2016. Doing well, abstinent since.       Poor dentition    Upcoming dentist appt.      Smoker    chantix started last month, requests continuing month course. Rx sent in today, advised to take 1/2 tab BID while GI illness resolves then may increase to full 1mg  BID dose.       Viral gastroenteritis - Primary    Anticipate viral stomach flu although sxs a bit prolonged - discussed with patient. He does endorse starting to feel better. Stable for outpatient management. Supportive care reviewed with patient. rec bland diet, discussed importance of good hydration. Tachycardia likely related to mild dehydration from ongoing diarrhea. Update if not improving with treatment - if ongoing diarrhea, I advised him to let us know for GI pathogen panel.  HA attributed to mild dehydration.          Follow up plan: No Follow-up on file.  Ria Bush, MD

## 2017-02-12 NOTE — Assessment & Plan Note (Signed)
Upcoming dentist appt.

## 2017-02-12 NOTE — Assessment & Plan Note (Addendum)
chantix started last month, requests continuing month course. Rx sent in today, advised to take 1/2 tab BID while GI illness resolves then may increase to full 1mg  BID dose.

## 2017-02-12 NOTE — Assessment & Plan Note (Addendum)
Anticipate viral stomach flu although sxs a bit prolonged - discussed with patient. He does endorse starting to feel better. Stable for outpatient management. Supportive care reviewed with patient. rec bland diet, discussed importance of good hydration. Tachycardia likely related to mild dehydration from ongoing diarrhea. Update if not improving with treatment - if ongoing diarrhea, I advised him to let us know for GI pathogen panel.  HA attributed to mild dehydration.

## 2017-03-30 ENCOUNTER — Encounter: Payer: Self-pay | Admitting: Family Medicine

## 2017-04-02 ENCOUNTER — Other Ambulatory Visit: Payer: Self-pay

## 2017-04-02 MED ORDER — LOSARTAN POTASSIUM 50 MG PO TABS: 50.0000 mg | ORAL_TABLET | Freq: Every day | ORAL | 0 refills | Status: DC

## 2017-04-02 MED ORDER — PAROXETINE HCL 40 MG PO TABS: 40.0000 mg | ORAL_TABLET | Freq: Every day | ORAL | 0 refills | Status: DC

## 2017-04-02 MED ORDER — ALLOPURINOL 100 MG PO TABS: 100.0000 mg | ORAL_TABLET | Freq: Every day | ORAL | 0 refills | Status: DC

## 2017-04-02 NOTE — Telephone Encounter (Signed)
Sent rxs to [facility] and [facility] per pt request.  Notified pt rxs were sent.

## 2017-04-02 NOTE — Telephone Encounter (Signed)
Sent paroxetine 30 day rx to [facility], then 90 day rx paroxetine, losartan and allopurinol to [facility] per pt request. [See pt email, 03/30/17.]

## 2017-04-12 ENCOUNTER — Encounter: Payer: Self-pay | Admitting: Gastroenterology

## 2017-05-10 ENCOUNTER — Encounter: Payer: Self-pay | Admitting: Family Medicine

## 2017-05-16 ENCOUNTER — Ambulatory Visit (AMBULATORY_SURGERY_CENTER): Payer: Self-pay

## 2017-05-16 VITALS — Ht 70.0 in | Wt 234.6 lb

## 2017-05-16 DIAGNOSIS — Z1211 Encounter for screening for malignant neoplasm of colon: Secondary | ICD-10-CM

## 2017-05-16 MED ORDER — NA SULFATE-K SULFATE-MG SULF 17.5-3.13-1.6 GM/177ML PO SOLN: 1.0000 | Freq: Once | ORAL | 0 refills | Status: AC

## 2017-05-16 NOTE — Progress Notes (Signed)
Per pt, no allergies to soy or egg products.Pt not taking any weight loss meds or using  O2 at home.  Pt refused emmi video. 

## 2017-05-22 HISTORY — PX: COLONOSCOPY: SHX174

## 2017-06-04 ENCOUNTER — Other Ambulatory Visit: Payer: Self-pay

## 2017-06-04 ENCOUNTER — Encounter: Payer: Self-pay | Admitting: Gastroenterology

## 2017-06-04 ENCOUNTER — Ambulatory Visit (AMBULATORY_SURGERY_CENTER): Payer: BLUE CROSS/BLUE SHIELD | Admitting: Gastroenterology

## 2017-06-04 VITALS — BP 107/74 | HR 69 | Temp 99.3°F | Resp 18 | Ht 70.0 in | Wt 234.0 lb

## 2017-06-04 DIAGNOSIS — D125 Benign neoplasm of sigmoid colon: Secondary | ICD-10-CM | POA: Diagnosis not present

## 2017-06-04 DIAGNOSIS — Z1211 Encounter for screening for malignant neoplasm of colon: Secondary | ICD-10-CM | POA: Diagnosis not present

## 2017-06-04 MED ORDER — SODIUM CHLORIDE 0.9 % IV SOLN: 500.0000 mL | Freq: Once | INTRAVENOUS | Status: DC

## 2017-06-04 NOTE — Patient Instructions (Signed)
Thank you for allowing [place] to care for you today!  Await pathology results by mail, approx 2 weeks.  Handout given for polyps, hemorrhoids, diverticulosis, and high fiber diet.  Follow-up colonoscopy dependent on pathology results, either  5 or 10 years.       YOU HAD AN ENDOSCOPIC PROCEDURE TODAY AT [facility]:   Refer to the procedure report that was given to you for any specific questions about what was found during the examination.  If the procedure report does not answer your questions, please call your gastroenterologist to clarify.  If you requested that your care partner not be given the details of your procedure findings, then the procedure report has been included in a sealed envelope for you to review at your convenience later.  YOU SHOULD EXPECT: Some feelings of bloating in the abdomen. Passage of more gas than usual.  Walking can help get rid of the air that was put into your GI tract during the procedure and reduce the bloating. If you had a lower endoscopy (such as a colonoscopy or flexible sigmoidoscopy) you may notice spotting of blood in your stool or on the toilet paper. If you underwent a bowel prep for your procedure, you may not have a normal bowel movement for a few days.  Please Note:  You might notice some irritation and congestion in your nose or some drainage.  This is from the oxygen used during your procedure.  There is no need for concern and it should clear up in a day or so.  SYMPTOMS TO REPORT IMMEDIATELY:   Following lower endoscopy (colonoscopy or flexible sigmoidoscopy):  Excessive amounts of blood in the stool  Significant tenderness or worsening of abdominal pains  Swelling of the abdomen that is new, acute  Fever of 100F or higher    For urgent or emergent issues, a gastroenterologist can be reached at any hour by calling [phone].   DIET:  We do recommend a small meal at first, but then you may proceed to your regular  diet.  Drink plenty of fluids but you should avoid alcoholic beverages for 24 hours.  ACTIVITY:  You should plan to take it easy for the rest of today and you should NOT DRIVE or use heavy machinery until tomorrow (because of the sedation medicines used during the test).    FOLLOW UP: Our staff will call the number listed on your records the next business day following your procedure to check on you and address any questions or concerns that you may have regarding the information given to you following your procedure. If we do not reach you, we will leave a message.  However, if you are feeling well and you are not experiencing any problems, there is no need to return our call.  We will assume that you have returned to your regular daily activities without incident.  If any biopsies were taken you will be contacted by phone or by letter within the next 1-3 weeks.  Please call us at [phone] if you have not heard about the biopsies in 3 weeks.    SIGNATURES/CONFIDENTIALITY: You and/or your care partner have signed paperwork which will be entered into your electronic medical record.  These signatures attest to the fact that that the information above on your After Visit Summary has been reviewed and is understood.  Full responsibility of the confidentiality of this discharge information lies with you and/or your care-partner.

## 2017-06-04 NOTE — Op Note (Signed)
[facility] Patient Name: Daniel May Procedure Date: 06/04/2017 11:09 AM MRN: [id number] Endoscopist: Ladene Artist , MD Age: 52 Referring MD:  Date of Birth: Sep 20, 1965 Gender: Male Account #: [id number] Procedure:                Colonoscopy Indications:              Screening for colorectal malignant neoplasm Medicines:                Monitored Anesthesia Care Procedure:                Pre-Anesthesia Assessment:                           - Prior to the procedure, a History and Physical                            was performed, and patient medications and                            allergies were reviewed. The patient's tolerance of                            previous anesthesia was also reviewed. The risks                            and benefits of the procedure and the sedation                            options and risks were discussed with the patient.                            All questions were answered, and informed consent                            was obtained. Prior Anticoagulants: The patient has                            taken no previous anticoagulant or antiplatelet                            agents. ASA Grade Assessment: II - A patient with                            mild systemic disease. After reviewing the risks                            and benefits, the patient was deemed in                            satisfactory condition to undergo the procedure.                           After obtaining informed consent, the colonoscope  was passed under direct vision. Throughout the                            procedure, the patient's blood pressure, pulse, and                            oxygen saturations were monitored continuously. The                            Model CF-HQ190L [id number]) scope was introduced                            through the anus and advanced to the the cecum,                            identified by  appendiceal orifice and ileocecal                            valve. The ileocecal valve, appendiceal orifice,                            and rectum were photographed. The quality of the                            bowel preparation was good. The colonoscopy was                            performed without difficulty. The patient tolerated                            the procedure well. Scope In: 11:13:55 AM Scope Out: 11:26:51 AM Scope Withdrawal Time: 0 hours 11 minutes 10 seconds  Total Procedure Duration: 0 hours 12 minutes 56 seconds  Findings:                 The perianal and digital rectal examinations were                            normal.                           Two sessile polyps were found in the sigmoid colon.                            The polyps were 5 to 8 mm in size. These polyps                            were removed with a cold snare. Resection and                            retrieval were complete.                           A few small-mouthed diverticula were found in the  sigmoid colon.                           Internal hemorrhoids were found during                            retroflexion. The hemorrhoids were small and Grade                            I (internal hemorrhoids that do not prolapse).                           The exam was otherwise without abnormality on                            direct and retroflexion views. Complications:            No immediate complications. Estimated blood loss:                            None. Estimated Blood Loss:     Estimated blood loss: none. Impression:               - Two 5 to 8 mm polyps in the sigmoid colon,                            removed with a cold snare. Resected and retrieved.                           - Mild diverticulosis in the sigmoid colon.                           - Internal hemorrhoids.                           - The examination was otherwise normal on direct                             and retroflexion views. Recommendation:           - Repeat colonoscopy in 5 years for surveillance if                            polyp(s) are precancerous, otherwise 10 years.                           - Patient has a contact number available for                            emergencies. The signs and symptoms of potential                            delayed complications were discussed with the                            patient. Return to normal activities tomorrow.  Written discharge instructions were provided to the                            patient.                           - High fiber diet.                           - Continue present medications.                           - Await pathology results. Ladene Artist, MD 06/04/2017 11:30:04 AM This report has been signed electronically.

## 2017-06-04 NOTE — Progress Notes (Signed)
Called to room to assist during endoscopic procedure.  Patient ID and intended procedure confirmed with present staff. Received instructions for my participation in the procedure from the performing physician.  

## 2017-06-04 NOTE — Progress Notes (Signed)
Spontaneous respirations throughout. VSS. Resting comfortably. To PACU on room air. Report to  RN. 

## 2017-06-05 ENCOUNTER — Telehealth: Payer: Self-pay

## 2017-06-05 NOTE — Telephone Encounter (Signed)
  Follow up Call-  Call back number 06/04/2017  Post procedure Call Back phone  # [phone]  Permission to leave phone message Yes  Some recent data might be hidden     Patient questions:  Do you have a fever, pain , or abdominal swelling? No. Pain Score  0 *  Have you tolerated food without any problems? Yes.    Have you been able to return to your normal activities? Yes.    Do you have any questions about your discharge instructions: Diet   No. Medications  No. Follow up visit  No.  Do you have questions or concerns about your Care? No.  Actions: * If pain score is 4 or above: No action needed, pain <4.

## 2017-06-15 ENCOUNTER — Encounter: Payer: Self-pay | Admitting: Family Medicine

## 2017-06-16 ENCOUNTER — Encounter: Payer: Self-pay | Admitting: Gastroenterology

## 2017-07-06 ENCOUNTER — Other Ambulatory Visit: Payer: Self-pay | Admitting: Family Medicine

## 2017-08-31 ENCOUNTER — Other Ambulatory Visit: Payer: Self-pay | Admitting: Family Medicine

## 2017-09-24 ENCOUNTER — Other Ambulatory Visit: Payer: Self-pay | Admitting: Family Medicine

## 2017-11-18 ENCOUNTER — Other Ambulatory Visit: Payer: Self-pay | Admitting: Family Medicine

## 2018-01-01 ENCOUNTER — Telehealth: Payer: Self-pay

## 2018-01-01 MED ORDER — PAROXETINE HCL 40 MG PO TABS: 40.0000 mg | ORAL_TABLET | Freq: Every day | ORAL | 0 refills | Status: DC

## 2018-01-01 MED ORDER — LOSARTAN POTASSIUM 50 MG PO TABS: 50.0000 mg | ORAL_TABLET | Freq: Every day | ORAL | 0 refills | Status: DC

## 2018-01-01 MED ORDER — ALLOPURINOL 100 MG PO TABS: 100.0000 mg | ORAL_TABLET | Freq: Every day | ORAL | 0 refills | Status: DC

## 2018-01-01 NOTE — Telephone Encounter (Signed)
Please schedule pt for annual physical.  E-scribed refills.

## 2018-01-02 NOTE — Telephone Encounter (Signed)
Left message asking pt to call office  °

## 2018-01-03 NOTE — Telephone Encounter (Signed)
Pt called back stating he would check his schedule and call back to schedule

## 2018-01-03 NOTE — Telephone Encounter (Signed)
Noted  

## 2018-03-09 ENCOUNTER — Telehealth: Payer: Self-pay | Admitting: Family Medicine

## 2018-03-10 NOTE — Telephone Encounter (Signed)
E-scribed refills.  Please schedule annual CPE.

## 2018-03-11 NOTE — Telephone Encounter (Signed)
Left message asking pt to call office  °

## 2018-03-25 NOTE — Telephone Encounter (Signed)
Left message asking pt to call office  °

## 2018-04-01 ENCOUNTER — Other Ambulatory Visit: Payer: Self-pay | Admitting: Family Medicine

## 2018-04-01 NOTE — Telephone Encounter (Signed)
Noted  

## 2018-04-01 NOTE — Telephone Encounter (Signed)
Labs 4/22 cpx 4/28 

## 2018-04-24 ENCOUNTER — Telehealth: Payer: Self-pay | Admitting: Family Medicine

## 2018-04-24 NOTE — Telephone Encounter (Signed)
Copied from [facility] [id number]. Topic: Quick Communication - See Telephone Encounter >> Apr 24, 2018  4:45 PM Blase Mess A wrote: CRM for notification. See Telephone encounter for: 04/24/18.  Patient is calling to reschedule his CPE on 05/20/18. Email & telephone number verified [phone] (M)

## 2018-04-25 NOTE — Telephone Encounter (Signed)
Left message asking pt to call office  °

## 2018-04-28 NOTE — Telephone Encounter (Signed)
Tried calling pt phone not taking calls

## 2018-04-30 NOTE — Telephone Encounter (Signed)
Appointment 7/15

## 2018-05-07 ENCOUNTER — Encounter: Payer: Self-pay | Admitting: Family Medicine

## 2018-05-08 MED ORDER — LOSARTAN POTASSIUM 50 MG PO TABS: 50.0000 mg | ORAL_TABLET | Freq: Every day | ORAL | 0 refills | Status: DC

## 2018-05-08 MED ORDER — PAROXETINE HCL 40 MG PO TABS: 40.0000 mg | ORAL_TABLET | Freq: Every day | ORAL | 0 refills | Status: DC

## 2018-05-08 MED ORDER — ALLOPURINOL 100 MG PO TABS: 100.0000 mg | ORAL_TABLET | Freq: Every day | ORAL | 0 refills | Status: DC

## 2018-05-08 NOTE — Telephone Encounter (Signed)
Pt is scheduled for 05/12/18 @ 3pm

## 2018-05-08 NOTE — Telephone Encounter (Signed)
E-scribed 90 day refills for requested meds.   Left message on vm for pt to call back.    Need to schedule a virtual med f/u. Already has CPE in July. Fyi to [employer].

## 2018-05-09 NOTE — Telephone Encounter (Signed)
Noted  

## 2018-05-12 ENCOUNTER — Ambulatory Visit: Payer: BLUE CROSS/BLUE SHIELD | Admitting: Family Medicine

## 2018-05-14 ENCOUNTER — Encounter: Payer: Self-pay | Admitting: Family Medicine

## 2018-05-14 ENCOUNTER — Ambulatory Visit (INDEPENDENT_AMBULATORY_CARE_PROVIDER_SITE_OTHER): Payer: BLUE CROSS/BLUE SHIELD | Admitting: Family Medicine

## 2018-05-14 ENCOUNTER — Other Ambulatory Visit: Payer: BLUE CROSS/BLUE SHIELD

## 2018-05-14 ENCOUNTER — Telehealth: Payer: Self-pay

## 2018-05-14 VITALS — Ht 71.0 in | Wt 240.0 lb

## 2018-05-14 DIAGNOSIS — M1A09X0 Idiopathic chronic gout, multiple sites, without tophus (tophi): Secondary | ICD-10-CM | POA: Diagnosis not present

## 2018-05-14 DIAGNOSIS — F3289 Other specified depressive episodes: Secondary | ICD-10-CM | POA: Diagnosis not present

## 2018-05-14 DIAGNOSIS — I10 Essential (primary) hypertension: Secondary | ICD-10-CM

## 2018-05-14 DIAGNOSIS — F17200 Nicotine dependence, unspecified, uncomplicated: Secondary | ICD-10-CM

## 2018-05-14 MED ORDER — PAROXETINE HCL 40 MG PO TABS: 40.0000 mg | ORAL_TABLET | Freq: Every day | ORAL | 3 refills | Status: DC

## 2018-05-14 MED ORDER — LOSARTAN POTASSIUM 50 MG PO TABS: 50.0000 mg | ORAL_TABLET | Freq: Every day | ORAL | 3 refills | Status: DC

## 2018-05-14 MED ORDER — ALLOPURINOL 100 MG PO TABS: 100.0000 mg | ORAL_TABLET | Freq: Every day | ORAL | 3 refills | Status: DC

## 2018-05-14 NOTE — Assessment & Plan Note (Signed)
Previously unsuccessful with chantix and wellbutrin. Discussed NRT he is contemplative.

## 2018-05-14 NOTE — Assessment & Plan Note (Signed)
Chronic, stable. Continue current regimen. 

## 2018-05-14 NOTE — Assessment & Plan Note (Signed)
Chronic, stable on paxil 40mg  - desires continued. Will check PHQ9 at CPE in July.

## 2018-05-14 NOTE — Telephone Encounter (Signed)
Left message on vm for pt to call back.  Need to collect info before [employer] visit at 12:00.

## 2018-05-14 NOTE — Telephone Encounter (Signed)
Pt has started virtual visit with Dr. Darnell Level.

## 2018-05-14 NOTE — Progress Notes (Signed)
Virtual visit completed through [employer]. Due to national recommendations of social distancing due to [place] 19, a virtual visit is felt to be most appropriate for this patient at this time.   Patient location: home Provider location: [place] at [facility], office If any vitals were documented, they were collected by patient at home unless specified below.    Ht 5\' 11"  (1.803 m)   Wt 240 lb (108.9 kg) Comment: 1 month ago  BMI 33.47 kg/m    CC: med refill visit  Subjective:    Patient ID: Daniel May, male    DOB: Sep 04, 1965, 53 y.o.   MRN: [id number]  HPI: Daniel May is a 53 y.o. male presenting on 05/14/2018 for No chief complaint on file.   Currently furloughed. 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 Abstinent since then.  Continued smoking - 1ppd. chantix didn't help much. wellbutrin previously not effective either. Has tried some nicotine replacement therapy.   Obesity - has put on some weight. Working on a diet - avoiding all processed foods and has minimized carbs. Feels well with healthy diet changes. Drinking water, minimizing juices. Increased exercise - working around the yard more.   Continues paxil 40mg  daily.   Gout - no recent flares. Tolerating allopurinol 100mg  daily.   HTN - Compliant with current antihypertensive regimen of losartan 50mg  daily.  Does not check blood pressures at home. No low blood pressure readings or symptoms of dizziness/syncope.  Denies HA, vision changes, CP/tightness, SOB, leg swelling.       Relevant past medical, surgical, family and social history reviewed and updated as indicated. Interim medical history since our last visit reviewed. Allergies and medications reviewed and updated. Outpatient Medications Prior to Visit  Medication Sig Dispense Refill  . Multiple Vitamins-Minerals (MULTIVITAMIN ADULT PO) Take by mouth.    . Omega-3 Fatty Acids (FISH OIL) 1200 MG  CAPS Take 2 capsules by mouth daily.    . vitamin C (ASCORBIC ACID) 500 MG tablet Take 500 mg by mouth daily.    Marland Kitchen allopurinol (ZYLOPRIM) 100 MG tablet Take 1 tablet (100 mg total) by mouth daily. 90 tablet 0  . losartan (COZAAR) 50 MG tablet Take 1 tablet (50 mg total) by mouth daily. 90 tablet 0  . PARoxetine (PAXIL) 40 MG tablet Take 1 tablet (40 mg total) by mouth daily. 90 tablet 0  . varenicline (CHANTIX) 1 MG tablet Take 1 tablet (1 mg total) by mouth 2 (two) times daily. (Patient not taking: Reported on 05/16/2017) 60 tablet 0  . 0.9 %  sodium chloride infusion      No facility-administered medications prior to visit.      Per HPI unless specifically indicated in ROS section below Review of Systems Objective:    Ht 5\' 11"  (1.803 m)   Wt 240 lb (108.9 kg) Comment: 1 month ago  BMI 33.47 kg/m   Wt Readings from Last 3 Encounters:  05/14/18 240 lb (108.9 kg)  06/04/17 234 lb (106.1 kg)  05/16/17 234 lb 9.6 oz (106.4 kg)     Physical exam: Gen: alert, NAD, not ill appearing Pulm: speaks in complete sentences without increased work of breathing Psych: normal mood, normal thought content      Assessment & Plan:  RTC 3 mo CPE with fasting labs prior.  Problem List Items Addressed This Visit    Smoker    Previously unsuccessful with chantix and wellbutrin. Discussed NRT  he is contemplative.       HTN (hypertension) - Primary    Chronic, stable. Continue current regimen.       Relevant Medications   losartan (COZAAR) 50 MG tablet   Gout    Chronic, stable. Continue current regimen.       Depression    Chronic, stable on paxil 40mg  - desires continued. Will check PHQ9 at CPE in July.       Relevant Medications   PARoxetine (PAXIL) 40 MG tablet       Meds ordered this encounter  Medications  . allopurinol (ZYLOPRIM) 100 MG tablet    Sig: Take 1 tablet (100 mg total) by mouth daily.    Dispense:  90 tablet    Refill:  3  . losartan (COZAAR) 50 MG tablet     Sig: Take 1 tablet (50 mg total) by mouth daily.    Dispense:  90 tablet    Refill:  3  . PARoxetine (PAXIL) 40 MG tablet    Sig: Take 1 tablet (40 mg total) by mouth daily.    Dispense:  90 tablet    Refill:  3   No orders of the defined types were placed in this encounter.   Follow up plan: No follow-ups on file.  Daniel Bush, MD

## 2018-05-20 ENCOUNTER — Encounter: Payer: BLUE CROSS/BLUE SHIELD | Admitting: Family Medicine

## 2018-06-24 ENCOUNTER — Encounter: Payer: Self-pay | Admitting: Family Medicine

## 2018-06-24 ENCOUNTER — Ambulatory Visit (INDEPENDENT_AMBULATORY_CARE_PROVIDER_SITE_OTHER): Payer: BLUE CROSS/BLUE SHIELD | Admitting: Family Medicine

## 2018-06-24 VITALS — Ht 71.0 in

## 2018-06-24 DIAGNOSIS — J22 Unspecified acute lower respiratory infection: Secondary | ICD-10-CM | POA: Diagnosis not present

## 2018-06-24 DIAGNOSIS — R109 Unspecified abdominal pain: Secondary | ICD-10-CM | POA: Insufficient documentation

## 2018-06-24 DIAGNOSIS — J0191 Acute recurrent sinusitis, unspecified: Secondary | ICD-10-CM | POA: Insufficient documentation

## 2018-06-24 DIAGNOSIS — J329 Chronic sinusitis, unspecified: Secondary | ICD-10-CM | POA: Insufficient documentation

## 2018-06-24 DIAGNOSIS — J019 Acute sinusitis, unspecified: Secondary | ICD-10-CM | POA: Insufficient documentation

## 2018-06-24 DIAGNOSIS — J0110 Acute frontal sinusitis, unspecified: Secondary | ICD-10-CM | POA: Insufficient documentation

## 2018-06-24 MED ORDER — AMOXICILLIN-POT CLAVULANATE 875-125 MG PO TABS: 1.0000 | ORAL_TABLET | Freq: Two times a day (BID) | ORAL | 0 refills | Status: AC

## 2018-06-24 NOTE — Assessment & Plan Note (Signed)
Describes 10 days of URI symptoms. Although tested negative for Covid19, still in differential as well as acute bacterial sinusitis or other respiratory infection. Will treat with augmentin course as per below.

## 2018-06-24 NOTE — Progress Notes (Signed)
Virtual visit completed through [place]. Due to national recommendations of social distancing due to COVID-19, a virtual visit is felt to be most appropriate for this patient at this time. Reviewed limitations of a virtual visit.   Patient location: home Provider location: [place] at [facility], office If any vitals were documented, they were collected by patient at home unless specified below.    Ht 5\' 11"  (1.803 m)   BMI 33.47 kg/m    CC: ST,fatigue Subjective:    Patient ID: Daniel May, male    DOB: Jul 13, 1965, 53 y.o.   MRN: [id number]  HPI: Daniel May is a 53 y.o. male presenting on 06/24/2018 for Sore Throat (C/o sore throat, fatigue, nasal congestion, cough and HA.  Sxs started 10 days ago. ) and Abdominal Pain (C/o LLQ abd pain that started yesterday. Area is tender to touch. Pain occurs when standing or sitting.  Relieved when lying in fetal position.  Denies any N/V/D. )   10d h/o cough, congestion, ST, fatigue, HA. Cough non productive. Head > chest congestion. No facial pain. Mild loss to taste or smell, attributes to coffee intake. No sick contacts at home. Feels chilled. Non smoker.   Had negative Covid19 nasal swab tested last week (at [facility]) - 06/17/2018 Tuesday, taken out of work until swab returned (Friday). Currently out of work due to illness.   Yesterday noticed sharp LLQ abd pain, tender, worse with standing and sitting, better when laying in fetal position. Also notes pain midline lower abdomen with palpation. Constipated today. No diarrhea, fever, nausea/vomiting, blood in stool. No blood in urine. No h/o kidney stones. Doesn't notice bulge in groin. This is his main concern today.   Colonoscopy 05/2017 - mild diverticulosis.     Relevant past medical, surgical, family and social history reviewed and updated as indicated. Interim medical history since our last visit reviewed. Allergies and medications reviewed and updated. Outpatient Medications Prior to Visit   Medication Sig Dispense Refill  . allopurinol (ZYLOPRIM) 100 MG tablet Take 1 tablet (100 mg total) by mouth daily. 90 tablet 3  . losartan (COZAAR) 50 MG tablet Take 1 tablet (50 mg total) by mouth daily. 90 tablet 3  . Multiple Vitamins-Minerals (MULTIVITAMIN ADULT PO) Take by mouth.    . Omega-3 Fatty Acids (FISH OIL) 1200 MG CAPS Take 2 capsules by mouth daily.    Marland Kitchen PARoxetine (PAXIL) 40 MG tablet Take 1 tablet (40 mg total) by mouth daily. 90 tablet 3  . vitamin C (ASCORBIC ACID) 500 MG tablet Take 500 mg by mouth daily.     No facility-administered medications prior to visit.      Per HPI unless specifically indicated in ROS section below Review of Systems Objective:    Ht 5\' 11"  (1.803 m)   BMI 33.47 kg/m   Wt Readings from Last 3 Encounters:  05/14/18 240 lb (108.9 kg)  06/04/17 234 lb (106.1 kg)  05/16/17 234 lb 9.6 oz (106.4 kg)     Physical exam: Gen: alert, NAD, not ill appearing Pulm: speaks in complete sentences without increased work of breathing Psych: normal mood, normal thought content      Lab Results  Component Value Date   CREATININE 0.99 04/12/2016   BUN 12 04/12/2016   NA 141 04/12/2016   K 4.3 04/12/2016   CL 106 04/12/2016   CO2 29 04/12/2016    Assessment & Plan:   Problem List Items Addressed This Visit    Left sided abdominal pain -  Primary    With lower abd pain and mild constipation today, concern for diverticulitis in h/o -osis on colonoscopy last year. rec bowel rest, fluids, treat presumptively for this with augmentin course. Ddx includes kidney stone - he will monitor for blood in urine. Update [place] with effect over next 24-48 hours.  Reviewed red flags to seek urgent care at ER or [facility]. Pt agrees with plan.  I will fill out FMLA forms as needed.       Acute respiratory infection    Describes 10 days of URI symptoms. Although tested negative for Covid19, still in differential as well as acute bacterial sinusitis or other respiratory  infection. Will treat with augmentin course as per below.           Meds ordered this encounter  Medications  . amoxicillin-clavulanate (AUGMENTIN) 875-125 MG tablet    Sig: Take 1 tablet by mouth 2 (two) times daily for 10 days.    Dispense:  20 tablet    Refill:  0   No orders of the defined types were placed in this encounter.   I discussed the assessment and treatment plan with the patient. The patient was provided an opportunity to ask questions and all were answered. The patient agreed with the plan and demonstrated an understanding of the instructions. The patient was advised to call back or seek an in-person evaluation if the symptoms worsen or if the condition fails to improve as anticipated.  Follow up plan: Return if symptoms worsen or fail to improve.  Ria Bush, MD

## 2018-06-24 NOTE — Assessment & Plan Note (Signed)
With lower abd pain and mild constipation today, concern for diverticulitis in h/o -osis on colonoscopy last year. rec bowel rest, fluids, treat presumptively for this with augmentin course. Ddx includes kidney stone - he will monitor for blood in urine. Update [place] with effect over next 24-48 hours.  Reviewed red flags to seek urgent care at ER or [facility]. Pt agrees with plan.  I will fill out FMLA forms as needed.

## 2018-06-25 ENCOUNTER — Telehealth: Payer: Self-pay | Admitting: Family Medicine

## 2018-06-25 NOTE — Telephone Encounter (Signed)
fmla paperwork on cart to be delivered by carrie

## 2018-06-30 ENCOUNTER — Encounter: Payer: Self-pay | Admitting: Family Medicine

## 2018-06-30 DIAGNOSIS — R109 Unspecified abdominal pain: Secondary | ICD-10-CM

## 2018-06-30 NOTE — Telephone Encounter (Signed)
Form signed and in my outbox

## 2018-07-01 NOTE — Telephone Encounter (Signed)
Lab visit scheduled on 07/02/18 at 3:30.

## 2018-07-01 NOTE — Telephone Encounter (Signed)
plz call to schedule CT scan as well as curbside labwork with urinalysis for ongoing abd pain.

## 2018-07-02 ENCOUNTER — Other Ambulatory Visit (INDEPENDENT_AMBULATORY_CARE_PROVIDER_SITE_OTHER): Payer: BLUE CROSS/BLUE SHIELD

## 2018-07-02 ENCOUNTER — Telehealth: Payer: Self-pay | Admitting: Family Medicine

## 2018-07-02 DIAGNOSIS — K5730 Diverticulosis of large intestine without perforation or abscess without bleeding: Secondary | ICD-10-CM | POA: Diagnosis not present

## 2018-07-02 DIAGNOSIS — R109 Unspecified abdominal pain: Secondary | ICD-10-CM | POA: Diagnosis not present

## 2018-07-02 LAB — POCT URINALYSIS DIPSTICK
Bilirubin, UA: NEGATIVE
Blood, UA: NEGATIVE
Glucose, UA: NEGATIVE
Ketones, UA: NEGATIVE
Leukocytes, UA: NEGATIVE
Nitrite, UA: NEGATIVE
Protein, UA: NEGATIVE
Spec Grav, UA: 1.01 (ref 1.010–1.025)
Urobilinogen, UA: 0.2 E.U./dL
pH, UA: 6.5 (ref 5.0–8.0)

## 2018-07-02 NOTE — Telephone Encounter (Signed)
Called to speak with patient, no answer.  Still awaiting lab results. UA was normal.  plz notify CT abd/pelvis with contrast returned reassuringly ok - showing diverticulosis but no diverticulitis.  Would still have him finish abx and schedule in-office visit for evaluation if ongoing abd pain.

## 2018-07-02 NOTE — Telephone Encounter (Signed)
UA done and results entered.

## 2018-07-03 LAB — COMPREHENSIVE METABOLIC PANEL
ALT: 33 U/L (ref 0–53)
AST: 24 U/L (ref 0–37)
Albumin: 4.2 g/dL (ref 3.5–5.2)
Alkaline Phosphatase: 60 U/L (ref 39–117)
BUN: 12 mg/dL (ref 6–23)
CO2: 29 mEq/L (ref 19–32)
Calcium: 9 mg/dL (ref 8.4–10.5)
Chloride: 103 mEq/L (ref 96–112)
Creatinine, Ser: 0.9 mg/dL (ref 0.40–1.50)
GFR: 88.82 mL/min (ref >60.00)
Glucose, Bld: 90 mg/dL (ref 70–99)
Potassium: 4.5 mEq/L (ref 3.5–5.1)
Sodium: 138 mEq/L (ref 135–145)
Total Bilirubin: 0.4 mg/dL (ref 0.2–1.2)
Total Protein: 7 g/dL (ref 6.0–8.3)

## 2018-07-03 LAB — CBC WITH DIFFERENTIAL/PLATELET
Basophils Absolute: 0.1 10*3/uL (ref 0.0–0.1)
Basophils Relative: 1.3 % (ref 0.0–3.0)
Eosinophils Absolute: 0.2 10*3/uL (ref 0.0–0.7)
Eosinophils Relative: 2.8 % (ref 0.0–5.0)
HCT: 44.6 % (ref 39.0–52.0)
Hemoglobin: 15.3 g/dL (ref 13.0–17.0)
Lymphocytes Relative: 30.5 % (ref 12.0–46.0)
Lymphs Abs: 2.1 10*3/uL (ref 0.7–4.0)
MCHC: 34.3 g/dL (ref 30.0–36.0)
MCV: 89.6 fl (ref 78.0–100.0)
Monocytes Absolute: 0.7 10*3/uL (ref 0.1–1.0)
Monocytes Relative: 9.7 % (ref 3.0–12.0)
Neutro Abs: 3.8 10*3/uL (ref 1.4–7.7)
Neutrophils Relative %: 55.7 % (ref 43.0–77.0)
Platelets: 210 10*3/uL (ref 150.0–400.0)
RBC: 4.97 Mil/uL (ref 4.22–5.81)
RDW: 13.6 % (ref 11.5–15.5)
WBC: 6.9 10*3/uL (ref 4.0–10.5)

## 2018-07-03 LAB — LIPASE: Lipase: 29 U/L (ref 11.0–59.0)

## 2018-07-04 NOTE — Telephone Encounter (Signed)
Paperwork faxed °

## 2018-07-04 NOTE — Telephone Encounter (Signed)
See mychart messages.  Please call pt to schedule in-office visit for evaluation today if possible.

## 2018-07-04 NOTE — Telephone Encounter (Signed)
Left message on vm for pt to call back.  Need to schedule in office visit for abd pain on Mon, 07/07/18.

## 2018-07-07 ENCOUNTER — Ambulatory Visit: Payer: BLUE CROSS/BLUE SHIELD | Admitting: Family Medicine

## 2018-07-07 ENCOUNTER — Other Ambulatory Visit: Payer: Self-pay

## 2018-07-07 ENCOUNTER — Encounter: Payer: Self-pay | Admitting: Family Medicine

## 2018-07-07 VITALS — BP 116/64 | HR 94 | Temp 98.6°F | Ht 71.0 in | Wt 232.4 lb

## 2018-07-07 DIAGNOSIS — R109 Unspecified abdominal pain: Secondary | ICD-10-CM

## 2018-07-07 MED ORDER — METHOCARBAMOL 500 MG PO TABS: 500.0000 mg | ORAL_TABLET | Freq: Three times a day (TID) | ORAL | 0 refills | Status: DC

## 2018-07-07 NOTE — Patient Instructions (Signed)
I think you have pelvic or lower abdomen strain - try muscle relaxant sent to pharmacy. Continue limiting heavy lifting and straining for now. Let us know if not continuing to improve over time.

## 2018-07-07 NOTE — Assessment & Plan Note (Signed)
Mild discomfort with palpation of inferior LLQ abdomen into L groin without obvious inguinal hernia noted on exam. Reproducible tenderness with testing hip muscles - anticipate pelvic/groin strain. His job is physical and entails heavy lifting.  No improvement with treatment for diverticulitis.  Treat as MSK strain with robaxin muscle relaxant.  Will recommend stay out of work for the next week given continued symptoms. Will update FMLA.

## 2018-07-07 NOTE — Progress Notes (Signed)
This visit was conducted in person.  BP 116/64 (BP Location: Left Arm, Patient Position: Sitting, Cuff Size: Large)   Pulse 94   Temp 98.6 F (37 C) (Oral)   Ht 5\' 11"  (1.803 m)   Wt 232 lb 7 oz (105.4 kg)   SpO2 96%   BMI 32.42 kg/m    CC: LLQ abd pain Subjective:    Patient ID: Daniel May, male    DOB: 1965/10/22, 53 y.o.   MRN: [id number]  HPI: Daniel May is a 53 y.o. male presenting on 07/07/2018 for Abdominal Pain (C/o LLQ abd pain. Started about 1 mo ago. Has had tests and scans. )   See last visit and recent labs/CT scan for details.  Sharp LLQ abd pain worse with movement (standing/sitting) associated with constipation but no fevers, diarrhea, nausea, blood in stool or urine. In h/o diverticulosis, treated virtually for diverticulitis with augmentin course. With persistent symptoms, returned for labwork with UA and CT, all of which where reassuring.   Ongoing discomfort LLQ abd pain more noticeable when sitting for prolonged period of time - ie mowing lawn yesterday morning painful burning/stretching present after 10 min, worse with any bump on ground.   CT abd/pelvis with contrast returned reassuringly ok - showing diverticulosis but no diverticulitis. Done at [facility] so still not in our system - pending scanning.   He has been out of work since Sun 06/15/2018.      Relevant past medical, surgical, family and social history reviewed and updated as indicated. Interim medical history since our last visit reviewed. Allergies and medications reviewed and updated. Outpatient Medications Prior to Visit  Medication Sig Dispense Refill  . allopurinol (ZYLOPRIM) 100 MG tablet Take 1 tablet (100 mg total) by mouth daily. 90 tablet 3  . losartan (COZAAR) 50 MG tablet Take 1 tablet (50 mg total) by mouth daily. 90 tablet 3  . Multiple Vitamins-Minerals (MULTIVITAMIN ADULT PO) Take by mouth.    . Omega-3 Fatty Acids (FISH OIL) 1200 MG CAPS Take 2 capsules by mouth daily.    Marland Kitchen  PARoxetine (PAXIL) 40 MG tablet Take 1 tablet (40 mg total) by mouth daily. 90 tablet 3  . vitamin C (ASCORBIC ACID) 500 MG tablet Take 500 mg by mouth daily.     No facility-administered medications prior to visit.      Per HPI unless specifically indicated in ROS section below Review of Systems Objective:    BP 116/64 (BP Location: Left Arm, Patient Position: Sitting, Cuff Size: Large)   Pulse 94   Temp 98.6 F (37 C) (Oral)   Ht 5\' 11"  (1.803 m)   Wt 232 lb 7 oz (105.4 kg)   SpO2 96%   BMI 32.42 kg/m   Wt Readings from Last 3 Encounters:  07/07/18 232 lb 7 oz (105.4 kg)  05/14/18 240 lb (108.9 kg)  06/04/17 234 lb (106.1 kg)    Physical Exam Vitals signs and nursing note reviewed.  Constitutional:      Appearance: He is well-developed. He is not ill-appearing.  HENT:     Mouth/Throat:     Mouth: Mucous membranes are moist.     Pharynx: No posterior oropharyngeal erythema.  Cardiovascular:     Rate and Rhythm: Normal rate and regular rhythm.     Pulses: Normal pulses.     Heart sounds: Normal heart sounds. No murmur.  Pulmonary:     Effort: Pulmonary effort is normal. No respiratory distress.     Breath sounds:  Normal breath sounds. No wheezing, rhonchi or rales.  Abdominal:     General: Abdomen is flat. Bowel sounds are normal. There is no distension.     Palpations: Abdomen is soft. There is no mass.     Tenderness: There is abdominal tenderness (mild) in the left lower quadrant. There is no right CVA tenderness, left CVA tenderness, guarding or rebound. Negative signs include Murphy's sign.     Hernia: No hernia is present. There is no hernia in the left inguinal area or right inguinal area.    Genitourinary:    Penis: Normal.      Scrotum/Testes: Normal.        Right: Mass, tenderness, swelling, testicular hydrocele or varicocele not present.        Left: Mass, tenderness, swelling, testicular hydrocele or varicocele not present.  Musculoskeletal: Normal range  of motion.     Right lower leg: No edema.     Left lower leg: No edema.     Comments: Some discomfort with testing of hip abductors/adductors and flexors against resistance on left  Neurological:     Mental Status: He is alert.  Psychiatric:        Mood and Affect: Mood normal.        Behavior: Behavior normal.       Results for orders placed or performed in visit on 07/02/18  Lipase  Result Value Ref Range   Lipase 29.0 11.0 - 59.0 U/L  CBC with Differential/Platelet  Result Value Ref Range   WBC 6.9 4.0 - 10.5 K/uL   RBC 4.97 4.22 - 5.81 Mil/uL   Hemoglobin 15.3 13.0 - 17.0 g/dL   HCT 44.6 39.0 - 52.0 %   MCV 89.6 78.0 - 100.0 fl   MCHC 34.3 30.0 - 36.0 g/dL   RDW 13.6 11.5 - 15.5 %   Platelets 210.0 150.0 - 400.0 K/uL   Neutrophils Relative % 55.7 43.0 - 77.0 %   Lymphocytes Relative 30.5 12.0 - 46.0 %   Monocytes Relative 9.7 3.0 - 12.0 %   Eosinophils Relative 2.8 0.0 - 5.0 %   Basophils Relative 1.3 0.0 - 3.0 %   Neutro Abs 3.8 1.4 - 7.7 K/uL   Lymphs Abs 2.1 0.7 - 4.0 K/uL   Monocytes Absolute 0.7 0.1 - 1.0 K/uL   Eosinophils Absolute 0.2 0.0 - 0.7 K/uL   Basophils Absolute 0.1 0.0 - 0.1 K/uL  Comprehensive metabolic panel  Result Value Ref Range   Sodium 138 135 - 145 mEq/L   Potassium 4.5 3.5 - 5.1 mEq/L   Chloride 103 96 - 112 mEq/L   CO2 29 19 - 32 mEq/L   Glucose, Bld 90 70 - 99 mg/dL   BUN 12 6 - 23 mg/dL   Creatinine, Ser 0.90 0.40 - 1.50 mg/dL   Total Bilirubin 0.4 0.2 - 1.2 mg/dL   Alkaline Phosphatase 60 39 - 117 U/L   AST 24 0 - 37 U/L   ALT 33 0 - 53 U/L   Total Protein 7.0 6.0 - 8.3 g/dL   Albumin 4.2 3.5 - 5.2 g/dL   Calcium 9.0 8.4 - 10.5 mg/dL   GFR 88.82 >60.00 mL/min  Urinalysis Dipstick  Result Value Ref Range   Color, UA yellow    Clarity, UA clear    Glucose, UA Negative Negative   Bilirubin, UA negative    Ketones, UA negative    Spec Grav, UA 1.010 1.010 - 1.025   Blood, UA negative  pH, UA 6.5 5.0 - 8.0   Protein, UA  Negative Negative   Urobilinogen, UA 0.2 0.2 or 1.0 E.U./dL   Nitrite, UA negative    Leukocytes, UA Negative Negative   Appearance     Odor     Assessment & Plan:   Problem List Items Addressed This Visit    Left sided abdominal pain - Primary    Mild discomfort with palpation of inferior LLQ abdomen into L groin without obvious inguinal hernia noted on exam. Reproducible tenderness with testing hip muscles - anticipate pelvic/groin strain. His job is physical and entails heavy lifting.  No improvement with treatment for diverticulitis.  Treat as MSK strain with robaxin muscle relaxant.  Will recommend stay out of work for the next week given continued symptoms. Will update FMLA.           Meds ordered this encounter  Medications  . methocarbamol (ROBAXIN) 500 MG tablet    Sig: Take 1-2 tablets (500-1,000 mg total) by mouth 3 (three) times daily. (sedation precautions)    Dispense:  60 tablet    Refill:  0   No orders of the defined types were placed in this encounter.   Follow up plan: No follow-ups on file.  Ria Bush, MD

## 2018-07-07 NOTE — Telephone Encounter (Signed)
Fyi, pt scheduled for 07/07/18 at 10:15 AM.

## 2018-07-08 ENCOUNTER — Encounter: Payer: Self-pay | Admitting: Family Medicine

## 2018-07-09 NOTE — Telephone Encounter (Signed)
Pt aware Copy mailed to pt Copy for scan

## 2018-07-09 NOTE — Telephone Encounter (Signed)
See pt mychart message. Letter written and in Lisa's box.

## 2018-07-09 NOTE — Telephone Encounter (Signed)
Letter is in basket on [employer] pending fax info from pt.

## 2018-07-15 NOTE — Telephone Encounter (Addendum)
Faxed letter to number provided before seeing pt's request for extension.

## 2018-07-16 ENCOUNTER — Encounter: Payer: Self-pay | Admitting: Family Medicine

## 2018-07-18 NOTE — Telephone Encounter (Signed)
New letter written and in Daniel May's box. Advised this was max out of work time frame for full 6 wks for recovery.

## 2018-07-21 NOTE — Telephone Encounter (Signed)
Left message on vm asking pt to call back.  Need to know if he will pick up letter or does he want it mailed to him.  Lemont Fillers is in basket on Lisa's desk pending how pt wants to receive letter.]

## 2018-07-21 NOTE — Telephone Encounter (Signed)
Faxed letter, per pt request.  Also, mailed original to pt.

## 2018-07-29 ENCOUNTER — Other Ambulatory Visit: Payer: BLUE CROSS/BLUE SHIELD

## 2018-08-06 ENCOUNTER — Encounter: Payer: BLUE CROSS/BLUE SHIELD | Admitting: Family Medicine

## 2018-08-07 ENCOUNTER — Encounter: Payer: Self-pay | Admitting: Family Medicine

## 2018-08-13 ENCOUNTER — Telehealth: Payer: Self-pay | Admitting: Family Medicine

## 2018-08-13 DIAGNOSIS — S76212A Strain of adductor muscle, fascia and tendon of left thigh, initial encounter: Secondary | ICD-10-CM

## 2018-08-13 NOTE — Telephone Encounter (Signed)
[place] paperwork in dr g in box For review and signature

## 2018-08-14 NOTE — Telephone Encounter (Signed)
Left message asking pt to call office Paperwork has been faxed  Copy for pt Copy for scan

## 2018-08-14 NOTE — Telephone Encounter (Signed)
filled and in my out box.

## 2018-08-15 NOTE — Telephone Encounter (Signed)
Pt aware.

## 2018-09-17 ENCOUNTER — Encounter: Payer: Self-pay | Admitting: Family Medicine

## 2018-09-18 NOTE — Telephone Encounter (Signed)
Could you help this pt with the info he's requesting?

## 2018-10-09 ENCOUNTER — Ambulatory Visit (INDEPENDENT_AMBULATORY_CARE_PROVIDER_SITE_OTHER): Payer: BC Managed Care – PPO | Admitting: Family Medicine

## 2018-10-09 ENCOUNTER — Encounter: Payer: Self-pay | Admitting: Family Medicine

## 2018-10-09 DIAGNOSIS — J0110 Acute frontal sinusitis, unspecified: Secondary | ICD-10-CM

## 2018-10-09 MED ORDER — AMOXICILLIN-POT CLAVULANATE 875-125 MG PO TABS: 1.0000 | ORAL_TABLET | Freq: Two times a day (BID) | ORAL | 0 refills | Status: AC

## 2018-10-09 MED ORDER — FLUTICASONE PROPIONATE 50 MCG/ACT NA SUSP: 2.0000 | Freq: Every day | NASAL | 1 refills | Status: DC

## 2018-10-09 NOTE — Progress Notes (Signed)
Virtual visit completed through [place]. Due to national recommendations of social distancing due to COVID-19, a virtual visit is felt to be most appropriate for this patient at this time. Reviewed limitations of a virtual visit.   Patient location: home Provider location: [place] at [facility], office If any vitals were documented, they were collected by patient at home unless specified below.    Ht 5\' 11"  (1.803 m)   Wt 228 lb (103.4 kg)   BMI 31.80 kg/m    CC: HA, nasal congestion Subjective:    Patient ID: Daniel May, male    DOB: [birth year], 53 y.o.   MRN: [id number]  HPI: Daniel May is a 53 y.o. male presenting on 10/09/2018 for Headache (C/o HA, nasal congestion, cough and sneezing.  Sxs started 2 wks ago.  Tried OTC Mucinex. )   2 wk h/o frontal sinus pressure headache, nasal > chest congestion, sneezing and cough. Some exertional dyspnea. Symptoms started with fever, nausea, diarrhea. Staying fatigued. Some PNDrainage. No loss of taste, mild loss of smell (more chronic in smoker history). No ST, ear or tooth pain. No purulent nasal discharge.   He was tested for covid19 at local urgent care center with nasal swab - negative.   Current smoker <1ppd.  Wife feels well.  No known covid exposure.  No h/o asthma.  Treating with OTC mucinex and advil for headache with limited benefit.   Last treated for acute sinusitis with augmentin course 06/2018.      Relevant past medical, surgical, family and social history reviewed and updated as indicated. Interim medical history since our last visit reviewed. Allergies and medications reviewed and updated. Outpatient Medications Prior to Visit  Medication Sig Dispense Refill  . allopurinol (ZYLOPRIM) 100 MG tablet Take 1 tablet (100 mg total) by mouth daily. 90 tablet 3  . losartan (COZAAR) 50 MG tablet Take 1 tablet (50 mg total) by mouth daily. 90 tablet 3  . methocarbamol (ROBAXIN) 500 MG tablet Take 1-2 tablets (500-1,000 mg  total) by mouth 3 (three) times daily. (sedation precautions) 60 tablet 0  . Multiple Vitamins-Minerals (MULTIVITAMIN ADULT PO) Take by mouth.    . Omega-3 Fatty Acids (FISH OIL) 1200 MG CAPS Take 2 capsules by mouth daily.    Marland Kitchen PARoxetine (PAXIL) 40 MG tablet Take 1 tablet (40 mg total) by mouth daily. 90 tablet 3  . vitamin C (ASCORBIC ACID) 500 MG tablet Take 500 mg by mouth daily.     No facility-administered medications prior to visit.      Per HPI unless specifically indicated in ROS section below Review of Systems Objective:    Ht 5\' 11"  (1.803 m)   Wt 228 lb (103.4 kg)   BMI 31.80 kg/m   Wt Readings from Last 3 Encounters:  10/09/18 228 lb (103.4 kg)  07/07/18 232 lb 7 oz (105.4 kg)  05/14/18 240 lb (108.9 kg)     Physical exam: Gen: alert, NAD, not ill appearing, no evident cyanosis HEENT: evident rhinorrhea. No facial swelling or erythema present Pulm: speaks in complete sentences without increased work of breathing or significant cough during visit Psych: normal mood, normal thought content      Assessment & Plan:   Problem List Items Addressed This Visit    Acute frontal sinusitis    He did have negative Covid test.  Symptoms suggestive of acute frontal bacterial sinusitis - treat with 10d augmentin course, start flonase, continue ibuprofen for symptoms. Update if not improving with treatment.  Pt agrees with plan.       Relevant Medications   amoxicillin-clavulanate (AUGMENTIN) 875-125 MG tablet   fluticasone (FLONASE) 50 MCG/ACT nasal spray       Meds ordered this encounter  Medications  . amoxicillin-clavulanate (AUGMENTIN) 875-125 MG tablet    Sig: Take 1 tablet by mouth 2 (two) times daily for 10 days.    Dispense:  20 tablet    Refill:  0  . fluticasone (FLONASE) 50 MCG/ACT nasal spray    Sig: Place 2 sprays into both nostrils daily.    Dispense:  16 g    Refill:  1   No orders of the defined types were placed in this encounter.   I discussed  the assessment and treatment plan with the patient. The patient was provided an opportunity to ask questions and all were answered. The patient agreed with the plan and demonstrated an understanding of the instructions. The patient was advised to call back or seek an in-person evaluation if the symptoms worsen or if the condition fails to improve as anticipated.  Follow up plan: Return if symptoms worsen or fail to improve.  Ria Bush, MD

## 2018-10-09 NOTE — Assessment & Plan Note (Signed)
He did have negative Covid test.  Symptoms suggestive of acute frontal bacterial sinusitis - treat with 10d augmentin course, start flonase, continue ibuprofen for symptoms. Update if not improving with treatment. Pt agrees with plan.

## 2018-10-16 ENCOUNTER — Encounter: Payer: Self-pay | Admitting: Family Medicine

## 2018-10-17 ENCOUNTER — Telehealth: Payer: Self-pay | Admitting: Family Medicine

## 2018-10-17 NOTE — Telephone Encounter (Signed)
I'm sorry he's not feeling better. Given symptoms, recommend he seek in-person eval at urgent care.  We are unable to bring into office for CXR with ongoing cough and ST.

## 2018-10-17 NOTE — Telephone Encounter (Signed)
Patient advised and verbalized understanding 

## 2018-10-17 NOTE — Telephone Encounter (Signed)
Patient did a virtual visit on 10/09/18 with Dr.G.  Patient said he's not feeling any better.  Patient still has chest congestion, nasal congestion,cough, headache and sore throat.  Patient's wondering if he needs another visit or chest x-ray or whatever Dr.G suggests.  Patient uses [facility].

## 2018-10-21 ENCOUNTER — Other Ambulatory Visit: Payer: Self-pay

## 2018-10-21 DIAGNOSIS — R6889 Other general symptoms and signs: Secondary | ICD-10-CM | POA: Diagnosis not present

## 2018-10-21 DIAGNOSIS — Z20822 Contact with and (suspected) exposure to covid-19: Secondary | ICD-10-CM

## 2018-10-22 LAB — NOVEL CORONAVIRUS, NAA: SARS-CoV-2, NAA: NOT DETECTED

## 2018-10-28 ENCOUNTER — Telehealth: Payer: Self-pay | Admitting: Family Medicine

## 2018-10-28 NOTE — Telephone Encounter (Signed)
Spouse dropped of FMLA paperwork She stated pt has been out of work since 09/25/2018 with covid  Paperwork in dr g in box for review and signature  Pt has virtual appointment with dr g 10/29/2018

## 2018-10-29 ENCOUNTER — Encounter: Payer: Self-pay | Admitting: Family Medicine

## 2018-10-29 ENCOUNTER — Ambulatory Visit (INDEPENDENT_AMBULATORY_CARE_PROVIDER_SITE_OTHER): Payer: BC Managed Care – PPO | Admitting: Family Medicine

## 2018-10-29 VITALS — Ht 71.0 in | Wt 225.0 lb

## 2018-10-29 DIAGNOSIS — J209 Acute bronchitis, unspecified: Secondary | ICD-10-CM | POA: Diagnosis not present

## 2018-10-29 MED ORDER — ALBUTEROL SULFATE HFA 108 (90 BASE) MCG/ACT IN AERS: 2.0000 | INHALATION_SPRAY | Freq: Four times a day (QID) | RESPIRATORY_TRACT | 1 refills | Status: DC | PRN

## 2018-10-29 NOTE — Assessment & Plan Note (Signed)
Prolonged course, now improving. Initial sinusitis treated with augmentin course + flonase. Had 2 negative covid tests so doubt Covid related. He has needed to stay out of work due to ongoing symptoms. Even if covid, he now meets return to work criteria.  Will Rx albuterol inhaler PRN - has used previously.  Discussed OTC cough syrup use PRN.  Will fill out FMLA form Will provide with return to work letter for Saturday (his next scheduled work day).

## 2018-10-29 NOTE — Telephone Encounter (Signed)
Filled and in my out box. Thanks.

## 2018-10-29 NOTE — Patient Instructions (Signed)
Letter for work to be faxed FMLA forms filled out Albuterol inhaler sent to pharmacy May try over the counter cough syrup like delsym or robitussin.

## 2018-10-29 NOTE — Progress Notes (Signed)
Virtual visit completed through [employer]. Due to national recommendations of social distancing due to COVID-19, a virtual visit is felt to be most appropriate for this patient at this time. Reviewed limitations of a virtual visit.   Patient location: home Provider location: [place] at [facility], office If any vitals were documented, they were collected by patient at home unless specified below.    Ht 5\' 11"  (1.803 m)   Wt 225 lb (102.1 kg)   BMI 31.38 kg/m    CC: discuss FMLA Subjective:    Patient ID: Daniel May, male    DOB: [birth year], 53 y.o.   MRN: [id number]  HPI: Boen Malatesta is a 53 y.o. male presenting on 10/29/2018 for Cough (C/o still having minor cough and congestion, which is improving with Mucinex.  However, pt is requesting prescription cough med and inhaler to help.  Also, pt's 2nd COVID test is neg but pt needs note to return to work on 11/03/18. ) and Form Completion (Wants to discuss FMLA paperwork. )   Seen here 10/09/2018 after seen at [facility] with dx sinusitis treated with augmentin and flonase. He did not improve so went back to urgent care for re evaluation. I don't have records of UCC evals. He did not have xray done.  Covid test at [facility] prior to that visit was negative.  He had second covid test 10/21/2018 which was also negative.   He remains somewhat congested but is markedly improved. He had cough, fatigue, now better. Continues taking mucinex with benefit. No fever throughout all of this. No dyspnea, no loss of taste or smell, nausea, abd pain, diarrhea. Notes some dyspnea with exertion, would like to try albuterol inhaler.   Has been out of work since 09/24/2018.   Was previously out of work for extended period in summer with L sided abd pain thought due to MSK cause (normal imaging at that time).   Recommend OTC cough syrup.      Relevant past medical, surgical, family and social history reviewed and updated as indicated. Interim medical history since our last  visit reviewed. Allergies and medications reviewed and updated. Outpatient Medications Prior to Visit  Medication Sig Dispense Refill  . allopurinol (ZYLOPRIM) 100 MG tablet Take 1 tablet (100 mg total) by mouth daily. 90 tablet 3  . fluticasone (FLONASE) 50 MCG/ACT nasal spray Place 2 sprays into both nostrils daily. 16 g 1  . losartan (COZAAR) 50 MG tablet Take 1 tablet (50 mg total) by mouth daily. 90 tablet 3  . methocarbamol (ROBAXIN) 500 MG tablet Take 1-2 tablets (500-1,000 mg total) by mouth 3 (three) times daily. (sedation precautions) 60 tablet 0  . Multiple Vitamins-Minerals (MULTIVITAMIN ADULT PO) Take by mouth.    . Omega-3 Fatty Acids (FISH OIL) 1200 MG CAPS Take 2 capsules by mouth daily.    Marland Kitchen PARoxetine (PAXIL) 40 MG tablet Take 1 tablet (40 mg total) by mouth daily. 90 tablet 3  . vitamin C (ASCORBIC ACID) 500 MG tablet Take 500 mg by mouth daily.     No facility-administered medications prior to visit.      Per HPI unless specifically indicated in ROS section below Review of Systems Objective:    Ht 5\' 11"  (1.803 m)   Wt 225 lb (102.1 kg)   BMI 31.38 kg/m   Wt Readings from Last 3 Encounters:  10/29/18 225 lb (102.1 kg)  10/09/18 228 lb (103.4 kg)  07/07/18 232 lb 7 oz (105.4 kg)  Physical exam: Gen: alert, NAD, not ill appearing Pulm: speaks in complete sentences without increased work of breathing Psych: normal mood, normal thought content      Results for orders placed or performed in visit on 10/21/18  Novel Coronavirus, NAA ([facility])   Specimen: Oropharyngeal(OP) collection in vial transport medium   OROPHARYNGEA  TESTING  Result Value Ref Range   SARS-CoV-2, NAA Not Detected Not Detected   Assessment & Plan:   Problem List Items Addressed This Visit    Acute bronchitis - Primary    Prolonged course, now improving. Initial sinusitis treated with augmentin course + flonase. Had 2 negative covid tests so doubt Covid related. He has needed to stay  out of work due to ongoing symptoms. Even if covid, he now meets return to work criteria.  Will Rx albuterol inhaler PRN - has used previously.  Discussed OTC cough syrup use PRN.  Will fill out FMLA form Will provide with return to work letter for Saturday (his next scheduled work day).           Meds ordered this encounter  Medications  . albuterol (VENTOLIN HFA) 108 (90 Base) MCG/ACT inhaler    Sig: Inhale 2 puffs into the lungs every 6 (six) hours as needed for wheezing or shortness of breath.    Dispense:  18 g    Refill:  1    Formulate based on insurance coverage, affordability   No orders of the defined types were placed in this encounter.   I discussed the assessment and treatment plan with the patient. The patient was provided an opportunity to ask questions and all were answered. The patient agreed with the plan and demonstrated an understanding of the instructions. The patient was advised to call back or seek an in-person evaluation if the symptoms worsen or if the condition fails to improve as anticipated.  Follow up plan: Return if symptoms worsen or fail to improve.  Ria Bush, MD

## 2018-10-30 NOTE — Telephone Encounter (Signed)
Pt aware.

## 2018-10-30 NOTE — Telephone Encounter (Signed)
Paperwork faxed °Copy for pt °Copy for scan °

## 2018-11-04 ENCOUNTER — Telehealth: Payer: Self-pay | Admitting: Family Medicine

## 2018-11-04 NOTE — Telephone Encounter (Signed)
Patient seen last week.  

## 2018-11-04 NOTE — Telephone Encounter (Signed)
[employer] faxed disability benefits form In dr Synthia Innocent in box

## 2018-11-10 NOTE — Telephone Encounter (Signed)
Filled and in my outbox

## 2018-11-12 NOTE — Telephone Encounter (Signed)
Paperwork faxed °Copy for pt °Copy for scan °

## 2018-12-15 ENCOUNTER — Telehealth: Payer: Self-pay

## 2018-12-15 NOTE — Telephone Encounter (Signed)
No h/o asthma or known COPD. Albuterol inhaler was 1 time Rx for acute illness (bronchitis).  Shouldn't have a need for refills so no need to complete PA.

## 2018-12-15 NOTE — Telephone Encounter (Signed)
Received faxed [place] denial for albuterol aer HFA stating the following additional info is needed for an appeal:   -Dx -All meds tried in the past for dx, including dose -Explanation why any relevant meds were stopped  This info needs to be in the form of a letter and can be faxed to [employer] at [phone].

## 2018-12-16 NOTE — Telephone Encounter (Signed)
Noted  

## 2018-12-26 ENCOUNTER — Ambulatory Visit: Payer: BC Managed Care – PPO | Admitting: Family Medicine

## 2018-12-26 ENCOUNTER — Other Ambulatory Visit: Payer: Self-pay

## 2018-12-26 ENCOUNTER — Encounter: Payer: Self-pay | Admitting: Family Medicine

## 2018-12-26 VITALS — BP 140/90 | HR 93 | Temp 99.5°F | Ht 71.0 in | Wt 248.0 lb

## 2018-12-26 DIAGNOSIS — M549 Dorsalgia, unspecified: Secondary | ICD-10-CM | POA: Diagnosis not present

## 2018-12-26 MED ORDER — DEXAMETHASONE SODIUM PHOSPHATE 100 MG/10ML IJ SOLN
10.0000 mg | Freq: Once | INTRAMUSCULAR | Status: AC
  Administered 2018-12-26: 10 mg via INTRAMUSCULAR

## 2018-12-26 MED ORDER — PREDNISONE 20 MG PO TABS: ORAL_TABLET | ORAL | 0 refills | Status: DC

## 2018-12-26 MED ORDER — TIZANIDINE HCL 4 MG PO TABS: 4.0000 mg | ORAL_TABLET | Freq: Every evening | ORAL | 1 refills | Status: DC | PRN

## 2018-12-26 MED ORDER — KETOROLAC TROMETHAMINE 60 MG/2ML IM SOLN
60.0000 mg | Freq: Once | INTRAMUSCULAR | Status: AC
  Administered 2018-12-26: 60 mg via INTRAMUSCULAR

## 2018-12-26 NOTE — Progress Notes (Signed)
Daniel Lucarelli [name], MD Primary Care and Sports Medicine [facility] at [facility] [place], [address] Phone: [phone]  FAX: [phone]  Daniel May - 53 y.o. male  MRN [id number]  Date of Birth: Nov 21, 1965  Visit Date: 12/26/2018  PCP: Ria Bush, MD  Referred by: Ria Bush, MD  Chief Complaint  Patient presents with  . Back Pain    Moving Fork from [employer] AM    This visit occurred during the SARS-CoV-2 public health emergency.  Safety protocols were in place, including screening questions prior to the visit, additional usage of staff PPE, and extensive cleaning of exam room while observing appropriate contact time as indicated for disinfecting solutions.   Subjective:   Daniel May is a 53 y.o. very pleasant male patient with Body mass index is 34.59 kg/m. 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  He describes an incident where he was moving a fork on his forklift and he felt something instantly in his back.  At that point he was unable to stand up straight.  He is able to take some time off of the forklift potentially at work.  He is not having any numbness or tingling or radicular symptoms down his legs.  It is focal in the lumbar spine region without any numbness or tingling going down his legs.  He has no significant prior history with low back pain or issues along with no prior back surgeries.  At this point he is having some difficulty standing and rising from a seated position.  He has reported this injury to his employer as a workers comp injury  Past Medical History, Surgical History, Social History, Family History, Problem List, Medications, and Allergies have been reviewed and updated if relevant.  Patient Active Problem List   Diagnosis Date Noted  . Left sided abdominal pain 06/24/2018  . Acute frontal sinusitis 06/24/2018  . Poor dentition  02/12/2017  . Gout 06/02/2015  . Health maintenance examination 07/28/2014  . Transaminitis 07/28/2014  . Acute bronchitis 12/04/2013  . History of alcohol abuse   . Depression   . HTN (hypertension)   . Dyslipidemia   . Smoker     Past Medical History:  Diagnosis Date  . Allergy   . Anxiety   . Closed fracture of tibia and fibula 1980's   right  . Depression   . H/O seasonal allergies   . History of alcohol abuse    Quit drinking 09/2015 (used antabuse), relapsed [facility] 09/2016, sober since  . History of chicken pox   . HLD (hyperlipidemia)   . HTN (hypertension)   . Smoker   . Substance abuse ([place])    alcohol    Past Surgical History:  Procedure Laterality Date  . COLONOSCOPY  05/2017   3 TA, mild diverticulosis, int hem, rpt 5 yrs [facility])  . Fracture repair of right leg fracture  1987    Social History   Socioeconomic History  . Marital status: Married    Spouse name: Not on file  . Number of children: Not on file  . Years of education: Not on file  . Highest education level: Not on file  Occupational History  . Not on file  Social Needs  . Financial resource strain: Not on file  . Food insecurity    Worry: Not on file    Inability: Not on file  .  Transportation needs    Medical: Not on file    Non-medical: Not on file  Tobacco Use  . Smoking status: Current Every Day Smoker    Packs/day: 1.00    Types: Cigarettes  . Smokeless tobacco: Never Used  Substance and Sexual Activity  . Alcohol use: No    Comment: hx ETOH  . Drug use: No  . Sexual activity: Not on file  Lifestyle  . Physical activity    Days per week: Not on file    Minutes per session: Not on file  . Stress: Not on file  Relationships  . Social Herbalist on phone: Not on file    Gets together: Not on file    Attends religious service: Not on file    Active member of club or organization: Not on file    Attends meetings of clubs or organizations: Not on file     Relationship status: Not on file  . Intimate partner violence    Fear of current or ex partner: Not on file    Emotionally abused: Not on file    Physically abused: Not on file    Forced sexual activity: Not on file  Other Topics Concern  . Not on file  Social History Narrative   Lives with wife, 1 dog and 3 cats   Occ: Works as Biomedical scientist   Edu: HS   Activity: walks dog   Diet: good water, fruits/vegetables daily    Family History  Problem Relation Age of Onset  . Cancer Father 87       lung (smoker)  . Diabetes Brother   . Diabetes Paternal Aunt   . Heart disease Mother        CHF  . Diabetes Sister   . Stroke Neg Hx   . CAD Neg Hx   . Colon cancer Neg Hx     No Known Allergies  Medication list reviewed and updated in full in [place].  GEN: No fevers, chills. Nontoxic. Primarily MSK c/o today. MSK: Detailed in the HPI GI: tolerating PO intake without difficulty Neuro: No numbness, parasthesias, or tingling associated. Otherwise the pertinent positives of the ROS are noted above.   Objective:   BP 140/90   Pulse 93   Temp 99.5 F (37.5 C) (Temporal)   Ht 5\' 11"  (1.803 m)   Wt 248 lb (112.5 kg)   SpO2 97%   BMI 34.59 kg/m    GEN: Well-developed,well-nourished,in no acute distress; alert,appropriate and cooperative throughout examination HEENT: Normocephalic and atraumatic without obvious abnormalities. Ears, externally no deformities PULM: Breathing comfortably in no respiratory distress EXT: No clubbing, cyanosis, or edema PSYCH: Normally interactive. Cooperative during the interview. Pleasant. Friendly and conversant. Not anxious or depressed appearing. Normal, full affect.  Range of motion at  the waist: Flexion, extension, lateral bending and rotation: Globally 35% loss of motion in all directions.  Notably with extension and flexion.  No echymosis or edema Rises to examination table with mild  difficulty Gait: minimally antalgic  Inspection/Deformity: N Paraspinus Tenderness: Tenderness from L1-S1 bilaterally.  B Ankle Dorsiflexion (L5,4): 5/5 B Great Toe Dorsiflexion (L5,4): 5/5 Heel Walk (L5): WNL Toe Walk (S1): WNL Rise/Squat (L4): WNL, mild pain  SENSORY B Medial Foot (L4): WNL B Dorsum (L5): WNL B Lateral (S1): WNL Light Touch: WNL Pinprick: WNL  REFLEXES Knee (L4): 2+ Ankle (S1): 2+  B SLR, seated: neg B SLR, supine: neg B  FABER: neg B Reverse FABER: neg B Greater Troch: NT B Log Roll: neg B Stork: NT B Sciatic Notch: NT   Radiology: No results found.   Assessment and Plan:     ICD-10-CM   1. Acute back pain, unspecified back location, unspecified back pain laterality  M54.9 dexamethasone (DECADRON) injection 10 mg    ketorolac (TORADOL) injection 60 mg   Neurovascularly intact.  Acute injury secondary to moving a fork with a Freight forwarder.  He appears to be in quite significant pain today.  I am going to go ahead and give him some Decadron as well as an injection of Toradol in the office.  Prednisone and Zanaflex orally after this.  If his employer has a dedicated [employer] physician then he certainly can follow-up with them.  If not, I will be happy to follow-up with him on this case.  Follow-up: No follow-ups on file.  Meds ordered this encounter  Medications  . predniSONE (DELTASONE) 20 MG tablet    Sig: 2 tabs po daily for 5 days, then 1 tab po daily for 5 days    Dispense:  15 tablet    Refill:  0  . tiZANidine (ZANAFLEX) 4 MG tablet    Sig: Take 1 tablet (4 mg total) by mouth at bedtime as needed for muscle spasms.    Dispense:  30 tablet    Refill:  1  . dexamethasone (DECADRON) injection 10 mg  . ketorolac (TORADOL) injection 60 mg   No orders of the defined types were placed in this encounter.   Signed,  Maud Deed. Avery Klingbeil, MD   Outpatient Encounter Medications as of 12/26/2018  Medication Sig  . albuterol  (VENTOLIN HFA) 108 (90 Base) MCG/ACT inhaler Inhale 2 puffs into the lungs every 6 (six) hours as needed for wheezing or shortness of breath.  . losartan (COZAAR) 50 MG tablet Take 1 tablet (50 mg total) by mouth daily.  . Multiple Vitamins-Minerals (MULTIVITAMIN ADULT PO) Take by mouth.  . Omega-3 Fatty Acids (FISH OIL) 1200 MG CAPS Take 2 capsules by mouth daily.  Marland Kitchen PARoxetine (PAXIL) 40 MG tablet Take 1 tablet (40 mg total) by mouth daily.  . vitamin C (ASCORBIC ACID) 500 MG tablet Take 500 mg by mouth daily.  . predniSONE (DELTASONE) 20 MG tablet 2 tabs po daily for 5 days, then 1 tab po daily for 5 days  . tiZANidine (ZANAFLEX) 4 MG tablet Take 1 tablet (4 mg total) by mouth at bedtime as needed for muscle spasms.  . [DISCONTINUED] allopurinol (ZYLOPRIM) 100 MG tablet Take 1 tablet (100 mg total) by mouth daily.  . [DISCONTINUED] fluticasone (FLONASE) 50 MCG/ACT nasal spray Place 2 sprays into both nostrils daily.  . [DISCONTINUED] methocarbamol (ROBAXIN) 500 MG tablet Take 1-2 tablets (500-1,000 mg total) by mouth 3 (three) times daily. (sedation precautions)  . [EXPIRED] dexamethasone (DECADRON) injection 10 mg   . [EXPIRED] ketorolac (TORADOL) injection 60 mg    No facility-administered encounter medications on file as of 12/26/2018.

## 2018-12-29 ENCOUNTER — Encounter: Payer: Self-pay | Admitting: Family Medicine

## 2019-01-06 ENCOUNTER — Encounter: Payer: Self-pay | Admitting: Family Medicine

## 2019-01-07 NOTE — Telephone Encounter (Signed)
Daniel May,   I am fine with this, so that he can return to work on 01/19/2019.  We may want to exactly confirm his dates, because he says 12/17 in one place and 12/18 in another.

## 2019-01-19 ENCOUNTER — Encounter: Payer: Self-pay | Admitting: Family Medicine

## 2019-01-27 ENCOUNTER — Ambulatory Visit: Payer: BC Managed Care – PPO | Admitting: Family Medicine

## 2019-01-28 ENCOUNTER — Other Ambulatory Visit: Payer: Self-pay

## 2019-01-28 ENCOUNTER — Ambulatory Visit (INDEPENDENT_AMBULATORY_CARE_PROVIDER_SITE_OTHER)
Admission: RE | Admit: 2019-01-28 | Discharge: 2019-01-28 | Disposition: A | Discharge status: Home / Self Care | Payer: BC Managed Care – PPO | Source: Ambulatory Visit | Attending: Family Medicine | Admitting: Family Medicine

## 2019-01-28 ENCOUNTER — Ambulatory Visit: Payer: BC Managed Care – PPO | Admitting: Family Medicine

## 2019-01-28 ENCOUNTER — Encounter: Payer: Self-pay | Admitting: Family Medicine

## 2019-01-28 VITALS — BP 122/90 | HR 98 | Temp 98.7°F | Ht 71.0 in | Wt 252.5 lb

## 2019-01-28 DIAGNOSIS — M549 Dorsalgia, unspecified: Secondary | ICD-10-CM

## 2019-01-28 DIAGNOSIS — M545 Low back pain: Secondary | ICD-10-CM | POA: Diagnosis not present

## 2019-01-28 MED ORDER — TIZANIDINE HCL 4 MG PO TABS: 4.0000 mg | ORAL_TABLET | Freq: Every evening | ORAL | 0 refills | Status: AC | PRN

## 2019-01-28 MED ORDER — PREDNISONE 20 MG PO TABS: ORAL_TABLET | ORAL | 0 refills | Status: DC

## 2019-01-28 NOTE — Progress Notes (Signed)
Daniel Milles T. Yoon Barca, MD Primary Care and Sports Medicine [facility] at [facility] [place], [address] Phone: [phone]  FAX: [phone]  Daniel May - 54 y.o. male  MRN [id number]  Date of Birth: August 09, 1965  Visit Date: 01/28/2019  PCP: Ria Bush, MD  Referred by: Ria Bush, MD  Chief Complaint  Patient presents with  . Follow-up    Back-Still having spasms    This visit occurred during the SARS-CoV-2 public health emergency.  Safety protocols were in place, including screening questions prior to the visit, additional usage of staff PPE, and extensive cleaning of exam room while observing appropriate contact time as indicated for disinfecting solutions.   Subjective:   Daniel May is a 54 y.o. very pleasant male patient with Body mass index is 35.22 kg/m. who presents with the following:  Daniel May is a pleasant guy, and I saw him only 1 time on December 26, 2018.  At that point I saw him for some acute back pain.  At that point I gave him 10 days of prednisone and some Zanaflex.  He is a 54 year old gentleman and he does operate a Forensic scientist for work.  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  At that point he was having some difficulty walking.  He does think he can potentially return to work with some limitation in his movement.  He declined at that point any neurological symptoms.  He will was having some focal back pain located just in the low back without any radicular symptoms, numbness, or tingling.  He does not have any significant back pain or prior history of this.  He did report this injury to his workplace as a workers comp injury.  He is here today in follow-up.  We have exchanged emails through the [facility] system a number of times.  ROM is much better. Sit or standing from lower back to his hips, rotating will really hurt and all muscles will  catch.  Laying down.  75% better.   No radiculopoathy.  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  This is what he notices more than anything else in regards to his back.  Past Medical History, Surgical History, Social History, Family History, Problem List, Medications, and Allergies have been reviewed and updated if relevant.  Patient Active Problem List   Diagnosis Date Noted  . Left sided abdominal pain 06/24/2018  . Acute frontal sinusitis 06/24/2018  . Poor dentition 02/12/2017  . Gout 06/02/2015  . Health maintenance examination 07/28/2014  . Transaminitis 07/28/2014  . Acute bronchitis 12/04/2013  . History of alcohol abuse   . Depression   . HTN (hypertension)   . Dyslipidemia   . Smoker     Past Medical History:  Diagnosis Date  . Allergy   . Anxiety   . Closed fracture of tibia and fibula 1980's   right  . Depression   . H/O seasonal allergies   . History of alcohol abuse    Quit drinking 09/2015 (used antabuse), relapsed [facility] 09/2016, sober since  . History of chicken pox   . HLD (hyperlipidemia)   . HTN (hypertension)   . Smoker   . Substance abuse ([place])    alcohol    Past Surgical History:  Procedure  Laterality Date  . COLONOSCOPY  05/2017   3 TA, mild diverticulosis, int hem, rpt 5 yrs [facility])  . Fracture repair of right leg fracture  1987    Social History   Socioeconomic History  . Marital status: Married    Spouse name: Not on file  . Number of children: Not on file  . Years of education: Not on file  . Highest education level: Not on file  Occupational History  . Not on file  Tobacco Use  . Smoking status: Current Every Day Smoker    Packs/day: 1.00    Types: Cigarettes  . Smokeless tobacco: Never Used  Substance and Sexual Activity  . Alcohol use: No    Comment: hx ETOH  . Drug use: No  . Sexual activity: Not on file  Other  Topics Concern  . Not on file  Social History Narrative   Lives with wife, 1 dog and 3 cats   Occ: Works as Biomedical scientist   Edu: HS   Activity: walks dog   Diet: good water, fruits/vegetables daily   Social Determinants of Radio broadcast assistant Strain:   . Difficulty of Paying Living Expenses: Not on file  Food Insecurity:   . Worried About Charity fundraiser in the Last Year: Not on file  . Ran Out of Food in the Last Year: Not on file  Transportation Needs:   . Lack of Transportation (Medical): Not on file  . Lack of Transportation (Non-Medical): Not on file  Physical Activity:   . Days of Exercise per Week: Not on file  . Minutes of Exercise per Session: Not on file  Stress:   . Feeling of Stress : Not on file  Social Connections:   . Frequency of Communication with Friends and Family: Not on file  . Frequency of Social Gatherings with Friends and Family: Not on file  . Attends Religious Services: Not on file  . Active Member of Clubs or Organizations: Not on file  . Attends Archivist Meetings: Not on file  . Marital Status: Not on file  Intimate Partner Violence:   . Fear of Current or Ex-Partner: Not on file  . Emotionally Abused: Not on file  . Physically Abused: Not on file  . Sexually Abused: Not on file    Family History  Problem Relation Age of Onset  . Cancer Father 70       lung (smoker)  . Diabetes Brother   . Diabetes Paternal Aunt   . Heart disease Mother        CHF  . Diabetes Sister   . Stroke Neg Hx   . CAD Neg Hx   . Colon cancer Neg Hx     No Known Allergies  Medication list reviewed and updated in full in [place].  GEN: No fevers, chills. Nontoxic. Primarily MSK c/o today. MSK: Detailed in the HPI GI: tolerating PO intake without difficulty Neuro: No numbness, parasthesias, or tingling associated. Otherwise the pertinent positives of the ROS are noted above.   Objective:    BP 122/90   Pulse 98   Temp 98.7 F (37.1 C) (Temporal)   Ht 5\' 11"  (1.803 m)   Wt 252 lb 8 oz (114.5 kg)   SpO2 95%   BMI 35.22 kg/m    GEN: Well-developed,well-nourished,in no acute distress; alert,appropriate and cooperative throughout examination HEENT: Normocephalic and atraumatic without obvious abnormalities. Ears, externally no deformities PULM:  Breathing comfortably in no respiratory distress EXT: No clubbing, cyanosis, or edema PSYCH: Normally interactive. Cooperative during the interview. Pleasant. Friendly and conversant. Not anxious or depressed appearing. Normal, full affect.  Range of motion at  the waist: Flexion, extension, lateral bending and rotation: He does have full flexion, extension, lateral bending.  Rotation does cause some discomfort.   No echymosis or edema Rises to examination table with mild difficulty Gait: minimally antalgic  Inspection/Deformity: N Paraspinus Tenderness: He does have pain in both paraspinous muscles and spasm from L2-S1.  B Ankle Dorsiflexion (L5,4): 5/5 B Great Toe Dorsiflexion (L5,4): 5/5 Heel Walk (L5): WNL Toe Walk (S1): WNL Rise/Squat (L4): WNL, mild pain  SENSORY B Medial Foot (L4): WNL B Dorsum (L5): WNL B Lateral (S1): WNL Light Touch: WNL Pinprick: WNL  REFLEXES Knee (L4): 2+ Ankle (S1): 2+  B SLR, seated: neg B SLR, supine: neg B FABER: neg B Reverse FABER: neg B Greater Troch: NT B Log Roll: neg B Stork: NT B Sciatic Notch: TTP  Radiology: XR Lumbar Spine  Result Date: 01/28/2019 CLINICAL DATA:  Low back pain following heavy lifting, initial encounter EXAM: LUMBAR SPINE - COMPLETE 4+ VIEW COMPARISON:  None. FINDINGS: Five lumbar type vertebral bodies are well visualized. Mild osteophytic changes are seen. No pars defects are noted. No anterolisthesis is noted. Mild disc space narrowing is noted at L4-5 and to a greater degree at L5-S1. No soft tissue changes are seen. IMPRESSION: Degenerative change  without acute abnormality. Electronically Signed   By: Inez Catalina M.D.   On: 01/28/2019 15:44    Assessment and Plan:     ICD-10-CM   1. Acute back pain, unspecified back location, unspecified back pain laterality  M54.9 predniSONE (DELTASONE) 20 MG tablet    Ambulatory referral to Physical Therapy    XR Lumbar Spine   He does have some ongoing continued pain of his back.  This is limiting him somewhat and limiting his ability to return to full function at work.  He is having rotational pain and pain with twisting and lifting heavier objects.  I Georgina Peer place him on the longer course of some prednisone.  I will check some plain films of his lumbar spine which are significant only for some mild degenerative changes.  I am also going to refer him to formal physical therapy for core strengthening, range of motion, hip strengthening, and manipulation of some degree to help with his SI joints.  Chiropractic manipulation would also be a reasonable option if this does not succeed.  Follow-up: Return in about 4 weeks (around 02/25/2019).  Meds ordered this encounter  Medications  . tiZANidine (ZANAFLEX) 4 MG tablet    Sig: Take 1 tablet (4 mg total) by mouth at bedtime as needed for muscle spasms.    Dispense:  1 tablet    Refill:  0  . predniSONE (DELTASONE) 20 MG tablet    Sig: 2 tabs po for 7 days, then 1 tab po for 7 days    Dispense:  21 tablet    Refill:  0   Orders Placed This Encounter  Procedures  . XR Lumbar Spine  . Ambulatory referral to Physical Therapy    Signed,  Frederico Hamman T. Tamar Miano, MD   Outpatient Encounter Medications as of 01/28/2019  Medication Sig  . albuterol (VENTOLIN HFA) 108 (90 Base) MCG/ACT inhaler Inhale 2 puffs into the lungs every 6 (six) hours as needed for wheezing or shortness of breath.  . losartan (COZAAR)  50 MG tablet Take 1 tablet (50 mg total) by mouth daily.  . Multiple Vitamins-Minerals (MULTIVITAMIN ADULT PO) Take by mouth.  . Omega-3 Fatty  Acids (FISH OIL) 1200 MG CAPS Take 2 capsules by mouth daily.  Marland Kitchen PARoxetine (PAXIL) 40 MG tablet Take 1 tablet (40 mg total) by mouth daily.  Marland Kitchen tiZANidine (ZANAFLEX) 4 MG tablet Take 1 tablet (4 mg total) by mouth at bedtime as needed for muscle spasms.  . vitamin C (ASCORBIC ACID) 500 MG tablet Take 500 mg by mouth daily.  . [DISCONTINUED] tiZANidine (ZANAFLEX) 4 MG tablet Take 1 tablet (4 mg total) by mouth at bedtime as needed for muscle spasms.  . predniSONE (DELTASONE) 20 MG tablet 2 tabs po for 7 days, then 1 tab po for 7 days  . [DISCONTINUED] predniSONE (DELTASONE) 20 MG tablet 2 tabs po daily for 5 days, then 1 tab po daily for 5 days   No facility-administered encounter medications on file as of 01/28/2019.

## 2019-02-01 ENCOUNTER — Encounter: Payer: Self-pay | Admitting: Family Medicine

## 2019-02-02 NOTE — Telephone Encounter (Signed)
Pt placed on [facility] [place].  Sent pt MyChart msg they will triage and call pt to schedule.

## 2019-02-02 NOTE — Telephone Encounter (Signed)
Daniel May.    Can you help?

## 2019-02-03 ENCOUNTER — Encounter: Payer: Self-pay | Admitting: Family Medicine

## 2019-02-05 ENCOUNTER — Telehealth: Payer: Self-pay | Admitting: Family Medicine

## 2019-02-05 DIAGNOSIS — Z0279 Encounter for issue of other medical certificate: Secondary | ICD-10-CM

## 2019-02-05 NOTE — Telephone Encounter (Signed)
Spoke with pt he stated he has been out of work since 12/26/2018 and wants to return 02/11/19  He stated his workman comp nurse stated he could go back to work on 12/18  Paperwork in Dr [employer] in box for review and signature  Pt stated he would like to pick up paperwork tomorrow he has a meeting Monday.  He stated they are trying to terminate him

## 2019-02-05 NOTE — Telephone Encounter (Signed)
done

## 2019-02-05 NOTE — Telephone Encounter (Signed)
Left message asking pt to call office  I have questions regarding paperwork

## 2019-02-05 NOTE — Telephone Encounter (Signed)
Left message letting pt know paperwork is ready for pick up also let him know I have not faxed paperwork.  When he was in office he stated he wanted to turn in.  If he wanted me to fax paperwork he needs to call me back to let me know  Copy for pt Copy for scan Copy for billing

## 2019-02-13 ENCOUNTER — Telehealth: Payer: Self-pay | Admitting: Family Medicine

## 2019-02-13 NOTE — Telephone Encounter (Signed)
Short term disability paperwork in dr copland's in box for review and signature

## 2019-02-16 NOTE — Telephone Encounter (Signed)
done

## 2019-02-17 ENCOUNTER — Telehealth: Payer: Self-pay

## 2019-02-17 NOTE — Telephone Encounter (Signed)
Harless Litten called to say she has faxed several requests for OV Notes pertaining to his back injury. She has not received them as of today and he could possibly be terminated if they do not receive the notes by the end of the week. Please fax to Medical Department at [phone]

## 2019-02-17 NOTE — Telephone Encounter (Signed)
I faxed a note to [place] letting her know we haven't received a request for records.  I asked her to fax a request for records and I'll send it to our copying service [employer].

## 2019-02-17 NOTE — Telephone Encounter (Signed)
Daniel May, would this be something you or [name] would do?

## 2019-02-19 NOTE — Telephone Encounter (Signed)
Left message asking pt to call office Please let pt know his paperwork has been faxed and there is a copy here for him  Copy for pt Copy for scan    Paperwork faxed 1/26

## 2019-05-18 ENCOUNTER — Other Ambulatory Visit: Payer: Self-pay

## 2019-05-18 ENCOUNTER — Ambulatory Visit: Payer: BC Managed Care – PPO | Attending: Internal Medicine

## 2019-05-18 DIAGNOSIS — Z20822 Contact with and (suspected) exposure to covid-19: Secondary | ICD-10-CM | POA: Diagnosis not present

## 2019-05-19 LAB — NOVEL CORONAVIRUS, NAA: SARS-CoV-2, NAA: NOT DETECTED

## 2019-05-19 LAB — SARS-COV-2, NAA 2 DAY TAT

## 2019-05-21 ENCOUNTER — Other Ambulatory Visit: Payer: Self-pay | Admitting: Family Medicine

## 2019-05-21 DIAGNOSIS — M1A09X0 Idiopathic chronic gout, multiple sites, without tophus (tophi): Secondary | ICD-10-CM

## 2019-05-21 DIAGNOSIS — E785 Hyperlipidemia, unspecified: Secondary | ICD-10-CM

## 2019-05-21 DIAGNOSIS — R7401 Elevation of levels of liver transaminase levels: Secondary | ICD-10-CM

## 2019-05-22 ENCOUNTER — Other Ambulatory Visit (INDEPENDENT_AMBULATORY_CARE_PROVIDER_SITE_OTHER): Payer: BC Managed Care – PPO

## 2019-05-22 ENCOUNTER — Other Ambulatory Visit: Payer: Self-pay

## 2019-05-22 DIAGNOSIS — E785 Hyperlipidemia, unspecified: Secondary | ICD-10-CM

## 2019-05-22 DIAGNOSIS — M1A09X0 Idiopathic chronic gout, multiple sites, without tophus (tophi): Secondary | ICD-10-CM | POA: Diagnosis not present

## 2019-05-22 LAB — LIPID PANEL
Cholesterol: 230 mg/dL — ABNORMAL HIGH (ref 0–200)
HDL: 41.7 mg/dL (ref >39.00)
LDL Cholesterol: 164 mg/dL — ABNORMAL HIGH (ref 0–99)
NonHDL: 188.46
Total CHOL/HDL Ratio: 6
Triglycerides: 123 mg/dL (ref 0.0–149.0)
VLDL: 24.6 mg/dL (ref 0.0–40.0)

## 2019-05-22 LAB — COMPREHENSIVE METABOLIC PANEL
ALT: 25 U/L (ref 0–53)
AST: 21 U/L (ref 0–37)
Albumin: 4.4 g/dL (ref 3.5–5.2)
Alkaline Phosphatase: 66 U/L (ref 39–117)
BUN: 15 mg/dL (ref 6–23)
CO2: 31 mEq/L (ref 19–32)
Calcium: 9.1 mg/dL (ref 8.4–10.5)
Chloride: 104 mEq/L (ref 96–112)
Creatinine, Ser: 1.04 mg/dL (ref 0.40–1.50)
GFR: 74.91 mL/min (ref >60.00)
Glucose, Bld: 114 mg/dL — ABNORMAL HIGH (ref 70–99)
Potassium: 4.3 mEq/L (ref 3.5–5.1)
Sodium: 141 mEq/L (ref 135–145)
Total Bilirubin: 0.3 mg/dL (ref 0.2–1.2)
Total Protein: 7.3 g/dL (ref 6.0–8.3)

## 2019-05-22 LAB — URIC ACID: Uric Acid, Serum: 8.6 mg/dL — ABNORMAL HIGH (ref 4.0–7.8)

## 2019-05-25 ENCOUNTER — Encounter: Payer: Self-pay | Admitting: Family Medicine

## 2019-05-25 ENCOUNTER — Other Ambulatory Visit: Payer: BC Managed Care – PPO

## 2019-05-25 ENCOUNTER — Other Ambulatory Visit: Payer: Self-pay

## 2019-05-25 ENCOUNTER — Ambulatory Visit (INDEPENDENT_AMBULATORY_CARE_PROVIDER_SITE_OTHER): Payer: BC Managed Care – PPO | Admitting: Family Medicine

## 2019-05-25 VITALS — BP 114/86 | HR 96 | Temp 98.4°F | Ht 70.0 in | Wt 245.0 lb

## 2019-05-25 DIAGNOSIS — F3289 Other specified depressive episodes: Secondary | ICD-10-CM

## 2019-05-25 DIAGNOSIS — E785 Hyperlipidemia, unspecified: Secondary | ICD-10-CM

## 2019-05-25 DIAGNOSIS — M1A09X0 Idiopathic chronic gout, multiple sites, without tophus (tophi): Secondary | ICD-10-CM

## 2019-05-25 DIAGNOSIS — Z125 Encounter for screening for malignant neoplasm of prostate: Secondary | ICD-10-CM | POA: Diagnosis not present

## 2019-05-25 DIAGNOSIS — F17200 Nicotine dependence, unspecified, uncomplicated: Secondary | ICD-10-CM

## 2019-05-25 DIAGNOSIS — R519 Headache, unspecified: Secondary | ICD-10-CM

## 2019-05-25 DIAGNOSIS — I10 Essential (primary) hypertension: Secondary | ICD-10-CM

## 2019-05-25 DIAGNOSIS — F1011 Alcohol abuse, in remission: Secondary | ICD-10-CM

## 2019-05-25 DIAGNOSIS — R5383 Other fatigue: Secondary | ICD-10-CM

## 2019-05-25 DIAGNOSIS — R5382 Chronic fatigue, unspecified: Secondary | ICD-10-CM | POA: Insufficient documentation

## 2019-05-25 DIAGNOSIS — Z0000 Encounter for general adult medical examination without abnormal findings: Secondary | ICD-10-CM

## 2019-05-25 LAB — CBC WITH DIFFERENTIAL/PLATELET
Basophils Absolute: 0.1 10*3/uL (ref 0.0–0.1)
Basophils Relative: 0.8 % (ref 0.0–3.0)
Eosinophils Absolute: 0.3 10*3/uL (ref 0.0–0.7)
Eosinophils Relative: 3.4 % (ref 0.0–5.0)
HCT: 45.7 % (ref 39.0–52.0)
Hemoglobin: 15.7 g/dL (ref 13.0–17.0)
Lymphocytes Relative: 27 % (ref 12.0–46.0)
Lymphs Abs: 2.5 10*3/uL (ref 0.7–4.0)
MCHC: 34.4 g/dL (ref 30.0–36.0)
MCV: 91 fl (ref 78.0–100.0)
Monocytes Absolute: 0.8 10*3/uL (ref 0.1–1.0)
Monocytes Relative: 8.9 % (ref 3.0–12.0)
Neutro Abs: 5.5 10*3/uL (ref 1.4–7.7)
Neutrophils Relative %: 59.9 % (ref 43.0–77.0)
Platelets: 226 10*3/uL (ref 150.0–400.0)
RBC: 5.03 Mil/uL (ref 4.22–5.81)
RDW: 14.1 % (ref 11.5–15.5)
WBC: 9.2 10*3/uL (ref 4.0–10.5)

## 2019-05-25 LAB — VITAMIN D 25 HYDROXY (VIT D DEFICIENCY, FRACTURES): VITD: 40.15 ng/mL (ref 30.00–100.00)

## 2019-05-25 LAB — TSH: TSH: 1.41 u[IU]/mL (ref 0.35–4.50)

## 2019-05-25 LAB — VITAMIN B12: Vitamin B-12: 564 pg/mL (ref 211–911)

## 2019-05-25 LAB — PSA: PSA: 0.97 ng/mL (ref 0.10–4.00)

## 2019-05-25 MED ORDER — LOSARTAN POTASSIUM 50 MG PO TABS: 50.0000 mg | ORAL_TABLET | Freq: Every day | ORAL | 3 refills | Status: DC

## 2019-05-25 MED ORDER — PAROXETINE HCL 40 MG PO TABS: 40.0000 mg | ORAL_TABLET | Freq: Every day | ORAL | 3 refills | Status: DC

## 2019-05-25 MED ORDER — ALBUTEROL SULFATE HFA 108 (90 BASE) MCG/ACT IN AERS: 2.0000 | INHALATION_SPRAY | Freq: Four times a day (QID) | RESPIRATORY_TRACT | 1 refills | Status: DC | PRN

## 2019-05-25 MED ORDER — ALLOPURINOL 100 MG PO TABS: 100.0000 mg | ORAL_TABLET | Freq: Every day | ORAL | 3 refills | Status: DC

## 2019-05-25 NOTE — Assessment & Plan Note (Addendum)
He has been out of work for the past 10 days.  Unclear cause. ?allergy related-  rec start flonase. Update with effect.  Encouraged he schedule eye exam as he's due.

## 2019-05-25 NOTE — Assessment & Plan Note (Signed)
Chronic, no recent gout flare but urate levels remain elevated - will start allopurinol 100mg  daily.

## 2019-05-25 NOTE — Assessment & Plan Note (Signed)
Chronic, stable. Continue current regimen. 

## 2019-05-25 NOTE — Assessment & Plan Note (Addendum)
Chronic, LDL above goal - discussed statin option - will work on low chol diet.  Trigylcerides improved with alcohol cessation.  The 10-year ASCVD risk score Mikey Bussing DC Brooke Bonito., et al., 2013) is: 11.7%   Values used to calculate the score:     Age: 54 years     Sex: Male     Is Non-Hispanic African American: No     Diabetic: No     Tobacco smoker: Yes     Systolic Blood Pressure: 99991111 mmHg     Is BP treated: Yes     HDL Cholesterol: 41.7 mg/dL     Total Cholesterol: 230 mg/dL

## 2019-05-25 NOTE — Assessment & Plan Note (Signed)
Stable period on paxil - continue.

## 2019-05-25 NOTE — Assessment & Plan Note (Signed)
Remains abstinent 

## 2019-05-25 NOTE — Assessment & Plan Note (Signed)
Preventative protocols reviewed and updated unless pt declined. Discussed healthy diet and lifestyle.  

## 2019-05-25 NOTE — Assessment & Plan Note (Signed)
Continue to encourage cessation. Precontemplative.  ?

## 2019-05-25 NOTE — Patient Instructions (Addendum)
Labs today. Start flonase over the counter for possible allergic component.  Sign up for covid shot at your convenience  Consider shingles shot - we will continue to discuss next year.  Start allopurinol 100mg  daily for gout prevention.  Consider cholesterol medicine (statin) to help lower risk of heart disease/stroke. Work on low cholesterol diet (handout provided today).  Schedule dentist, eye exam, and skin check.  Return as needed or in 1 year for next physical.   Health Maintenance, Male Adopting a healthy lifestyle and getting preventive care are important in promoting health and wellness. Ask your health care provider about:  The right schedule for you to have regular tests and exams.  Things you can do on your own to prevent diseases and keep yourself healthy. What should I know about diet, weight, and exercise? Eat a healthy diet   Eat a diet that includes plenty of vegetables, fruits, low-fat dairy products, and lean protein.  Do not eat a lot of foods that are high in solid fats, added sugars, or sodium. Maintain a healthy weight Body mass index (BMI) is a measurement that can be used to identify possible weight problems. It estimates body fat based on height and weight. Your health care provider can help determine your BMI and help you achieve or maintain a healthy weight. Get regular exercise Get regular exercise. This is one of the most important things you can do for your health. Most adults should:  Exercise for at least 150 minutes each week. The exercise should increase your heart rate and make you sweat (moderate-intensity exercise).  Do strengthening exercises at least twice a week. This is in addition to the moderate-intensity exercise.  Spend less time sitting. Even light physical activity can be beneficial. Watch cholesterol and blood lipids Have your blood tested for lipids and cholesterol at 52 years of age, then have this test every 5 years. You may need to  have your cholesterol levels checked more often if:  Your lipid or cholesterol levels are high.  You are older than 52 years of age.  You are at high risk for heart disease. What should I know about cancer screening? Many types of cancers can be detected early and may often be prevented. Depending on your health history and family history, you may need to have cancer screening at various ages. This may include screening for:  Colorectal cancer.  Prostate cancer.  Skin cancer.  Lung cancer. What should I know about heart disease, diabetes, and high blood pressure? Blood pressure and heart disease  High blood pressure causes heart disease and increases the risk of stroke. This is more likely to develop in people who have high blood pressure readings, are of African descent, or are overweight.  Talk with your health care provider about your target blood pressure readings.  Have your blood pressure checked: ? Every 3-5 years if you are 58-37 years of age. ? Every year if you are 36 years old or older.  If you are between the ages of 56 and 30 and are a current or former smoker, ask your health care provider if you should have a one-time screening for abdominal aortic aneurysm (AAA). Diabetes Have regular diabetes screenings. This checks your fasting blood sugar level. Have the screening done:  Once every three years after age 74 if you are at a normal weight and have a low risk for diabetes.  More often and at a younger age if you are overweight or have a  high risk for diabetes. What should I know about preventing infection? Hepatitis B If you have a higher risk for hepatitis B, you should be screened for this virus. Talk with your health care provider to find out if you are at risk for hepatitis B infection. Hepatitis C Blood testing is recommended for:  Everyone born from [birth year] through 1963.  Anyone with known risk factors for hepatitis C. Sexually transmitted infections  (STIs)  You should be screened each year for STIs, including gonorrhea and chlamydia, if: ? You are sexually active and are younger than 52 years of age. ? You are older than 52 years of age and your health care provider tells you that you are at risk for this type of infection. ? Your sexual activity has changed since you were last screened, and you are at increased risk for chlamydia or gonorrhea. Ask your health care provider if you are at risk.  Ask your health care provider about whether you are at high risk for HIV. Your health care provider may recommend a prescription medicine to help prevent HIV infection. If you choose to take medicine to prevent HIV, you should first get tested for HIV. You should then be tested every 3 months for as long as you are taking the medicine. Follow these instructions at home: Lifestyle  Do not use any products that contain nicotine or tobacco, such as cigarettes, e-cigarettes, and chewing tobacco. If you need help quitting, ask your health care provider.  Do not use street drugs.  Do not share needles.  Ask your health care provider for help if you need support or information about quitting drugs. Alcohol use  Do not drink alcohol if your health care provider tells you not to drink.  If you drink alcohol: ? Limit how much you have to 0-2 drinks a day. ? Be aware of how much alcohol is in your drink. In the [place] one drink equals one 12 oz bottle of beer (355 mL), one 5 oz glass of wine (148 mL), or one 1 oz glass of hard liquor (44 mL). General instructions  Schedule regular health, dental, and eye exams.  Stay current with your vaccines.  Tell your health care provider if: ? You often feel depressed. ? You have ever been abused or do not feel safe at home. Summary  Adopting a healthy lifestyle and getting preventive care are important in promoting health and wellness.  Follow your health care provider's instructions about healthy diet,  exercising, and getting tested or screened for diseases.  Follow your health care provider's instructions on monitoring your cholesterol and blood pressure. This information is not intended to replace advice given to you by your health care provider. Make sure you discuss any questions you have with your health care provider. Document Revised: 01/01/2018 Document Reviewed: 01/01/2018 Elsevier Patient Education  2020 [employer].

## 2019-05-25 NOTE — Assessment & Plan Note (Signed)
Recent negative covid test.  Unclear cause. Check for reversible causes of fatigue.

## 2019-05-25 NOTE — Progress Notes (Signed)
This visit was conducted in person.  BP 114/86 (BP Location: Left Arm, Patient Position: Sitting, Cuff Size: Large)   Pulse 96   Temp 98.4 F (36.9 C)   Ht 5\' 10"  (1.778 m)   Wt 245 lb (111.1 kg)   SpO2 96%   BMI 35.15 kg/m    CC: CPE Subjective:    Patient ID: Daniel May, male    DOB: April 27, 1965, 54 y.o.   MRN: [id number]  HPI: Sheri Hottinger is a 54 y.o. male presenting on 05/25/2019 for Annual Exam and Fatigue (Last few weeks gets fatigued easily. Also gets body aches and headaches. Neg Covid Test.)   10d h/o bitemporal headaches, body aches and fatigue. Mild congestion. Had negative covid test Monday 4/26. No significant stress endorsed. No dyspnea, loss of taste or smell. No known COVID exposures. No fevers, abd pain, nausea, diarrhea. Not drinking enough water. No vision changes.   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   Preventative: COLONOSCOPY 05/2017 - 3 TA, mild diverticulosis, int hem, rpt 5 yrs [facility] Plan) Prostate screening - discussed. No fmhx prostate cancer. Nocturia x1.  Flu shot - doesn't get  Tetanus - 2016 shingrix - discussed. To consider. Had complicated case of chicken pox with PNA at age 40 yo.  COVID vaccine - interested. May get [facility].  Seat belt use discussed  Sunscreen use discussed. No changing moles. Has growth to left scalp.  Smoking - ongoing, 1 ppd. Contemplative.  Alcohol - quit 2018 - prior habitual drinker, went through rehab Dentist - yearly - due Eye exam - due  Lives with wife, 1 dog and 3 cats Occ: Works as Biomedical scientist Edu: HS Activity: walks dog Diet: good water, fruits/vegetables daily     Relevant past medical, surgical, family and social history reviewed and updated as indicated. Interim medical history since our last visit reviewed. Allergies and medications reviewed and updated. Outpatient Medications Prior to Visit  Medication Sig Dispense  Refill  . Multiple Vitamins-Minerals (MULTIVITAMIN ADULT PO) Take by mouth.    . Omega-3 Fatty Acids (FISH OIL) 1200 MG CAPS Take 2 capsules by mouth daily.    . vitamin C (ASCORBIC ACID) 500 MG tablet Take 500 mg by mouth daily.    Marland Kitchen albuterol (VENTOLIN HFA) 108 (90 Base) MCG/ACT inhaler Inhale 2 puffs into the lungs every 6 (six) hours as needed for wheezing or shortness of breath. 18 g 1  . losartan (COZAAR) 50 MG tablet Take 1 tablet (50 mg total) by mouth daily. 90 tablet 3  . PARoxetine (PAXIL) 40 MG tablet Take 1 tablet (40 mg total) by mouth daily. 90 tablet 3  . predniSONE (DELTASONE) 20 MG tablet 2 tabs po for 7 days, then 1 tab po for 7 days 21 tablet 0   No facility-administered medications prior to visit.     Per HPI unless specifically indicated in ROS section below Review of Systems  Constitutional: Positive for fatigue. Negative for activity change, appetite change, chills, fever and unexpected weight change.  HENT: Negative for hearing loss.   Eyes: Negative for visual disturbance.  Respiratory: Negative for cough, chest tightness, shortness of breath and wheezing.   Cardiovascular: Negative for chest pain, palpitations and leg swelling.  Gastrointestinal: Negative for abdominal distention, abdominal pain, blood in stool, constipation, diarrhea, nausea and vomiting.  Genitourinary: Negative for difficulty urinating and hematuria.  Musculoskeletal: Positive for myalgias. Negative for arthralgias and  neck pain.  Skin: Negative for rash.  Neurological: Positive for headaches. Negative for dizziness, seizures and syncope.  Hematological: Negative for adenopathy. Does not bruise/bleed easily.  Psychiatric/Behavioral: Negative for dysphoric mood. The patient is not nervous/anxious.    Objective:  BP 114/86 (BP Location: Left Arm, Patient Position: Sitting, Cuff Size: Large)   Pulse 96   Temp 98.4 F (36.9 C)   Ht 5\' 10"  (1.778 m)   Wt 245 lb (111.1 kg)   SpO2 96%   BMI  35.15 kg/m   Wt Readings from Last 3 Encounters:  05/25/19 245 lb (111.1 kg)  01/28/19 252 lb 8 oz (114.5 kg)  12/26/18 248 lb (112.5 kg)      Physical Exam Vitals and nursing note reviewed.  Constitutional:      General: He is not in acute distress.    Appearance: Normal appearance. He is well-developed. He is obese. He is not ill-appearing.  HENT:     Head: Normocephalic and atraumatic.     Right Ear: Hearing, tympanic membrane, ear canal and external ear normal.     Left Ear: Hearing, tympanic membrane, ear canal and external ear normal.  Eyes:     General: No scleral icterus.    Conjunctiva/sclera: Conjunctivae normal.     Pupils: Pupils are equal, round, and reactive to light.  Cardiovascular:     Rate and Rhythm: Normal rate and regular rhythm.     Pulses: Normal pulses.          Radial pulses are 2+ on the right side and 2+ on the left side.     Heart sounds: Normal heart sounds. No murmur.  Pulmonary:     Effort: Pulmonary effort is normal. No respiratory distress.     Breath sounds: Normal breath sounds. No wheezing, rhonchi or rales.  Abdominal:     General: Abdomen is flat. Bowel sounds are normal. There is no distension.     Palpations: Abdomen is soft. There is no mass.     Tenderness: There is no abdominal tenderness. There is no guarding or rebound.     Hernia: No hernia is present.  Genitourinary:    Prostate: Enlarged (slight - 25gm). Not tender and no nodules present.     Rectum: Normal. No mass, tenderness, anal fissure, external hemorrhoid or internal hemorrhoid. Normal anal tone.  Musculoskeletal:        General: Normal range of motion.     Cervical back: Normal range of motion and neck supple.     Right lower leg: No edema.     Left lower leg: No edema.  Lymphadenopathy:     Cervical: No cervical adenopathy.  Skin:    General: Skin is warm and dry.     Findings: Lesion present. No rash.     Comments: Verrucous growth to L superior temple at scalp    Neurological:     General: No focal deficit present.     Mental Status: He is alert and oriented to person, place, and time.     Comments: CN grossly intact, station and gait intact  Psychiatric:        Mood and Affect: Mood normal.        Behavior: Behavior normal.        Thought Content: Thought content normal.        Judgment: Judgment normal.       Results for orders placed or performed in visit on 05/22/19  Uric acid  Result Value Ref  Range   Uric Acid, Serum 8.6 (H) 4.0 - 7.8 mg/dL  Comprehensive metabolic panel  Result Value Ref Range   Sodium 141 135 - 145 mEq/L   Potassium 4.3 3.5 - 5.1 mEq/L   Chloride 104 96 - 112 mEq/L   CO2 31 19 - 32 mEq/L   Glucose, Bld 114 (H) 70 - 99 mg/dL   BUN 15 6 - 23 mg/dL   Creatinine, Ser 1.04 0.40 - 1.50 mg/dL   Total Bilirubin 0.3 0.2 - 1.2 mg/dL   Alkaline Phosphatase 66 39 - 117 U/L   AST 21 0 - 37 U/L   ALT 25 0 - 53 U/L   Total Protein 7.3 6.0 - 8.3 g/dL   Albumin 4.4 3.5 - 5.2 g/dL   GFR 74.91 >60.00 mL/min   Calcium 9.1 8.4 - 10.5 mg/dL  Lipid panel  Result Value Ref Range   Cholesterol 230 (H) 0 - 200 mg/dL   Triglycerides 123.0 0.0 - 149.0 mg/dL   HDL 41.70 >39.00 mg/dL   VLDL 24.6 0.0 - 40.0 mg/dL   LDL Cholesterol 164 (H) 0 - 99 mg/dL   Total CHOL/HDL Ratio 6    NonHDL 188.46   No results found for: PSA1, PSA   Depression screen [facility] 2/9 05/25/2019  Decreased Interest 0  Down, Depressed, Hopeless 0  PHQ - 2 Score 0   Assessment & Plan:  This visit occurred during the SARS-CoV-2 public health emergency.  Safety protocols were in place, including screening questions prior to the visit, additional usage of staff PPE, and extensive cleaning of exam room while observing appropriate contact time as indicated for disinfecting solutions.   Problem List Items Addressed This Visit    Smoker    Continue to encourage cessation.  Precontemplative.       Nonintractable episodic headache    He has been out of work for the  past 10 days.  Unclear cause. ?allergy related-  rec start flonase. Update with effect.  Encouraged he schedule eye exam as he's due.       Relevant Medications   allopurinol (ZYLOPRIM) 100 MG tablet   PARoxetine (PAXIL) 40 MG tablet   Other Relevant Orders   CBC with Differential/Platelet   TSH   HTN (hypertension)    Chronic, stable. Continue current regimen.       Relevant Medications   losartan (COZAAR) 50 MG tablet   History of alcohol abuse    Remains abstinent.       Health maintenance examination - Primary    Preventative protocols reviewed and updated unless pt declined. Discussed healthy diet and lifestyle.       Gout    Chronic, no recent gout flare but urate levels remain elevated - will start allopurinol 100mg  daily.       Fatigue    Recent negative covid test.  Unclear cause. Check for reversible causes of fatigue.       Relevant Orders   Vitamin B12   VITAMIN D 25 Hydroxy (Vit-D Deficiency, Fractures)   CBC with Differential/Platelet   TSH   Dyslipidemia    Chronic, LDL above goal - discussed statin option - will work on low chol diet.  Trigylcerides improved with alcohol cessation.  The 10-year ASCVD risk score Mikey Bussing DC Brooke Bonito., et al., 2013) is: 11.7%   Values used to calculate the score:     Age: 85 years     Sex: Male     Is Non-Hispanic African American: No  Diabetic: No     Tobacco smoker: Yes     Systolic Blood Pressure: 99991111 mmHg     Is BP treated: Yes     HDL Cholesterol: 41.7 mg/dL     Total Cholesterol: 230 mg/dL       Depression    Stable period on paxil - continue.       Relevant Medications   PARoxetine (PAXIL) 40 MG tablet    Other Visit Diagnoses    Special screening for malignant neoplasm of prostate       Relevant Orders   PSA       Meds ordered this encounter  Medications  . allopurinol (ZYLOPRIM) 100 MG tablet    Sig: Take 1 tablet (100 mg total) by mouth daily.    Dispense:  90 tablet    Refill:  3  .  losartan (COZAAR) 50 MG tablet    Sig: Take 1 tablet (50 mg total) by mouth daily.    Dispense:  90 tablet    Refill:  3  . PARoxetine (PAXIL) 40 MG tablet    Sig: Take 1 tablet (40 mg total) by mouth daily.    Dispense:  90 tablet    Refill:  3  . albuterol (VENTOLIN HFA) 108 (90 Base) MCG/ACT inhaler    Sig: Inhale 2 puffs into the lungs every 6 (six) hours as needed for wheezing or shortness of breath.    Dispense:  18 g    Refill:  1    Formulate based on insurance coverage, affordability   Orders Placed This Encounter  Procedures  . Vitamin B12  . VITAMIN D 25 Hydroxy (Vit-D Deficiency, Fractures)  . CBC with Differential/Platelet  . TSH  . PSA    Patient instructions: Labs today. Start flonase over the counter for possible allergic component.  Sign up for covid shot at your convenience  Consider shingles shot - we will continue to discuss next year.  Start allopurinol 100mg  daily for gout prevention.  Consider cholesterol medicine (statin) to help lower risk of heart disease/stroke. Work on low cholesterol diet (handout provided today).  Schedule dentist, eye exam, and skin check.  Return as needed or in 1 year for next physical.   Follow up plan: Return in about 1 year (around 05/24/2020) for annual exam, prior fasting for blood work.  Ria Bush, MD

## 2019-05-26 ENCOUNTER — Encounter: Payer: Self-pay | Admitting: Family Medicine

## 2019-05-26 DIAGNOSIS — M791 Myalgia, unspecified site: Secondary | ICD-10-CM

## 2019-05-29 NOTE — Addendum Note (Signed)
Addended by: Ria Bush on: 05/29/2019 02:02 PM   Modules accepted: Orders

## 2019-06-01 ENCOUNTER — Other Ambulatory Visit: Payer: Self-pay

## 2019-06-01 ENCOUNTER — Telehealth: Payer: Self-pay

## 2019-06-01 ENCOUNTER — Other Ambulatory Visit (INDEPENDENT_AMBULATORY_CARE_PROVIDER_SITE_OTHER): Payer: BC Managed Care – PPO

## 2019-06-01 DIAGNOSIS — M791 Myalgia, unspecified site: Secondary | ICD-10-CM

## 2019-06-01 LAB — SEDIMENTATION RATE: Sed Rate: 22 mm/hr — ABNORMAL HIGH (ref 0–20)

## 2019-06-01 LAB — CK: Total CK: 81 U/L (ref 7–232)

## 2019-06-01 MED ORDER — ALBUTEROL SULFATE HFA 108 (90 BASE) MCG/ACT IN AERS: 2.0000 | INHALATION_SPRAY | Freq: Four times a day (QID) | RESPIRATORY_TRACT | 0 refills | Status: DC | PRN

## 2019-06-01 NOTE — Telephone Encounter (Signed)
activities as tolerated 

## 2019-06-01 NOTE — Addendum Note (Signed)
Addended by: Brenton Grills on: [id number] PM   Modules accepted: Orders

## 2019-06-01 NOTE — Telephone Encounter (Signed)
Patient says the albuterol medication was $65 with his local pharmacy.Would like the prescription sent through [facility] to reduce cost.  [facility] CSR number-[phone]

## 2019-06-01 NOTE — Telephone Encounter (Signed)
Pt returned Lisa's call. I relayed the information to him and he verbalized understanding.

## 2019-06-01 NOTE — Telephone Encounter (Addendum)
E-scribed 90-day rx to [facility].     Lvm asking pt to call back.  Need to notify him of info above.

## 2019-06-02 ENCOUNTER — Encounter: Payer: Self-pay | Admitting: Family Medicine

## 2019-06-02 MED ORDER — ALBUTEROL SULFATE HFA 108 (90 BASE) MCG/ACT IN AERS: 2.0000 | INHALATION_SPRAY | Freq: Four times a day (QID) | RESPIRATORY_TRACT | 0 refills | Status: DC | PRN

## 2019-06-02 NOTE — Addendum Note (Signed)
Addended by: Carter Kitten on: 06/02/2019 05:08 PM   Modules accepted: Orders

## 2019-06-02 NOTE — Telephone Encounter (Signed)
Noted  

## 2019-06-04 ENCOUNTER — Encounter: Payer: Self-pay | Admitting: Family Medicine

## 2019-06-04 LAB — B. BURGDORFI ANTIBODIES BY WB

## 2019-06-08 ENCOUNTER — Encounter: Payer: Self-pay | Admitting: Family Medicine

## 2019-06-09 NOTE — Telephone Encounter (Addendum)
Lvm asking pt to call back.  Plz offer OV with another provider for ongoing fatigue, HA, body aches.  Also, sent MyChart message.

## 2019-06-15 ENCOUNTER — Encounter: Payer: Self-pay | Admitting: Family Medicine

## 2019-06-18 ENCOUNTER — Encounter: Payer: Self-pay | Admitting: Family Medicine

## 2019-06-18 ENCOUNTER — Other Ambulatory Visit: Payer: Self-pay

## 2019-06-18 ENCOUNTER — Ambulatory Visit (INDEPENDENT_AMBULATORY_CARE_PROVIDER_SITE_OTHER): Payer: BC Managed Care – PPO | Admitting: Family Medicine

## 2019-06-18 VITALS — BP 128/76 | HR 98 | Temp 98.1°F | Ht 70.0 in | Wt 246.5 lb

## 2019-06-18 DIAGNOSIS — M2550 Pain in unspecified joint: Secondary | ICD-10-CM | POA: Diagnosis not present

## 2019-06-18 DIAGNOSIS — M1A09X0 Idiopathic chronic gout, multiple sites, without tophus (tophi): Secondary | ICD-10-CM | POA: Diagnosis not present

## 2019-06-18 DIAGNOSIS — R7401 Elevation of levels of liver transaminase levels: Secondary | ICD-10-CM

## 2019-06-18 DIAGNOSIS — R5383 Other fatigue: Secondary | ICD-10-CM

## 2019-06-18 LAB — POC URINALSYSI DIPSTICK (AUTOMATED)
Bilirubin, UA: NEGATIVE
Blood, UA: NEGATIVE
Glucose, UA: NEGATIVE
Ketones, UA: NEGATIVE
Leukocytes, UA: NEGATIVE
Nitrite, UA: NEGATIVE
Protein, UA: NEGATIVE
Spec Grav, UA: 1.01 (ref 1.010–1.025)
Urobilinogen, UA: 0.2 E.U./dL
pH, UA: 7 (ref 5.0–8.0)

## 2019-06-18 LAB — HIGH SENSITIVITY CRP: CRP, High Sensitivity: 10.73 mg/L — ABNORMAL HIGH (ref 0.000–5.000)

## 2019-06-18 LAB — CBC WITH DIFFERENTIAL/PLATELET
Basophils Absolute: 0.1 10*3/uL (ref 0.0–0.1)
Basophils Relative: 0.7 % (ref 0.0–3.0)
Eosinophils Absolute: 0.2 10*3/uL (ref 0.0–0.7)
Eosinophils Relative: 2.4 % (ref 0.0–5.0)
HCT: 47.1 % (ref 39.0–52.0)
Hemoglobin: 16.2 g/dL (ref 13.0–17.0)
Lymphocytes Relative: 21.8 % (ref 12.0–46.0)
Lymphs Abs: 1.8 10*3/uL (ref 0.7–4.0)
MCHC: 34.3 g/dL (ref 30.0–36.0)
MCV: 90.6 fl (ref 78.0–100.0)
Monocytes Absolute: 0.8 10*3/uL (ref 0.1–1.0)
Monocytes Relative: 9.7 % (ref 3.0–12.0)
Neutro Abs: 5.4 10*3/uL (ref 1.4–7.7)
Neutrophils Relative %: 65.4 % (ref 43.0–77.0)
Platelets: 216 10*3/uL (ref 150.0–400.0)
RBC: 5.2 Mil/uL (ref 4.22–5.81)
RDW: 14.3 % (ref 11.5–15.5)
WBC: 8.3 10*3/uL (ref 4.0–10.5)

## 2019-06-18 LAB — SEDIMENTATION RATE: Sed Rate: 14 mm/hr (ref 0–20)

## 2019-06-18 NOTE — Patient Instructions (Addendum)
Labs today Urinalysis today We will be in touch with results.

## 2019-06-18 NOTE — Progress Notes (Signed)
This visit was conducted in person.  BP 128/76 (BP Location: Left Arm, Patient Position: Sitting, Cuff Size: Large)   Pulse 98   Temp 98.1 F (36.7 C) (Temporal)   Ht 5\' 10"  (1.778 m)   Wt 246 lb 8 oz (111.8 kg)   SpO2 95%   BMI 35.37 kg/m   No data found.   CC: fatigue, malaise  Subjective:    Patient ID: Daniel May, male    DOB: 27-Sep-1965, 54 y.o.   MRN: [id number]  HPI: Daniel May is a 54 y.o. male presenting on 06/18/2019 for Fatigue (C/o ongoing fatigue, HA, nausea and body aches. )   See prior note for details.  Seen for physical 5/3, endorsed 10d h/o bitemporal headaches, body aches, nausea, fatigue. Negative COVID test. 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 Reassuring laboratory evaluation earlier this month for above symptoms including normal lyme disease testing. Sleeping 14 hours/day. Worsening joint pains - knees, hips, shoulders, elbows as well as back and hands. Ongoing lower back pain. Predominantly notes arthralgias with activity, no pain when resting. No redness or warmth or swelling of joints. Can wake up with headache. He tried to return to work 06/08/2019 but could only tolerate 1/2 day due to fatigue and then left work.   Doesn't snore. No witnessed apneic episodes.   Allopurinol 100mg  was started 05/25/2019 due to elevated urate - symptoms started prior to commencement.   Some dry mouth. Some photophobia.  Denies fevers/chills, new rash, abd pain, bowel changes. No neck pain or stiffness. No phonophobia. No palpitations. No night sweats or unexpected weight loss. No known tick bites or mosquito bites prior to onset.   UTD colon cancer screening (colooscopy 05/2017).      Relevant past medical, surgical, family and social history reviewed and updated as indicated. Interim medical history since our last visit reviewed. Allergies and medications reviewed and updated. Outpatient Medications Prior to Visit  Medication Sig Dispense Refill    . albuterol (VENTOLIN HFA) 108 (90 Base) MCG/ACT inhaler Inhale 2 puffs into the lungs every 6 (six) hours as needed for wheezing or shortness of breath. 54 g 0  . allopurinol (ZYLOPRIM) 100 MG tablet Take 1 tablet (100 mg total) by mouth daily. 90 tablet 3  . losartan (COZAAR) 50 MG tablet Take 1 tablet (50 mg total) by mouth daily. 90 tablet 3  . Multiple Vitamins-Minerals (MULTIVITAMIN ADULT PO) Take by mouth.    . Omega-3 Fatty Acids (FISH OIL) 1200 MG CAPS Take 2 capsules by mouth daily.    Marland Kitchen PARoxetine (PAXIL) 40 MG tablet Take 1 tablet (40 mg total) by mouth daily. 90 tablet 3  . vitamin C (ASCORBIC ACID) 500 MG tablet Take 500 mg by mouth daily.     No facility-administered medications prior to visit.     Per HPI unless specifically indicated in ROS section below Review of Systems Objective:  BP 128/76 (BP Location: Left Arm, Patient Position: Sitting, Cuff Size: Large)   Pulse 98   Temp 98.1 F (36.7 C) (Temporal)   Ht 5\' 10"  (1.778 m)   Wt 246 lb 8 oz (111.8 kg)   SpO2 95%   BMI 35.37 kg/m   Wt Readings from Last 3 Encounters:  06/18/19 246 lb 8 oz (111.8 kg)  05/25/19 245 lb (111.1 kg)  01/28/19 252 lb 8 oz (114.5 kg)      Physical Exam Vitals and nursing note reviewed.  Constitutional:  Appearance: Normal appearance. He is obese. He is not ill-appearing.  HENT:     Mouth/Throat:     Mouth: Mucous membranes are moist.     Pharynx: Oropharynx is clear. No oropharyngeal exudate or posterior oropharyngeal erythema.  Eyes:     Extraocular Movements: Extraocular movements intact.     Pupils: Pupils are equal, round, and reactive to light.  Neck:     Thyroid: No thyromegaly or thyroid tenderness.  Cardiovascular:     Rate and Rhythm: Normal rate and regular rhythm.     Pulses: Normal pulses.     Heart sounds: Normal heart sounds. No murmur.  Pulmonary:     Effort: Pulmonary effort is normal. No respiratory distress.     Breath sounds: Normal breath sounds.  No wheezing, rhonchi or rales.  Abdominal:     General: Bowel sounds are normal. There is no distension.     Palpations: Abdomen is soft. There is no hepatomegaly, splenomegaly or mass.     Tenderness: There is no abdominal tenderness. There is no right CVA tenderness, left CVA tenderness, guarding or rebound.     Hernia: No hernia is present.  Musculoskeletal:        General: No swelling or tenderness. Normal range of motion.     Cervical back: Normal range of motion and neck supple.     Right lower leg: No edema.     Left lower leg: No edema.  Skin:    General: Skin is warm and dry.     Findings: No erythema or rash.  Neurological:     General: No focal deficit present.     Mental Status: He is alert.  Psychiatric:        Mood and Affect: Mood normal.        Behavior: Behavior normal.       Results for orders placed or performed in visit on 06/18/19  SARS CoV2 Serology(COVID19) AB(IgG,IgM),Immunoassay   Specimen: Blood  Result Value Ref Range   SARS CoV-2 AB IgG NEGATIVE NEGATIVE   SARS CoV-2 IgM NEGATIVE NEGATIVE  Sedimentation rate  Result Value Ref Range   Sed Rate 14 0 - 20 mm/hr  High sensitivity CRP  Result Value Ref Range   CRP, High Sensitivity 10.730 (H) 0.000 - 5.000 mg/L  ANA  Result Value Ref Range   Anti Nuclear Antibody (ANA) NEGATIVE NEGATIVE  CBC with Differential/Platelet  Result Value Ref Range   WBC 8.3 4.0 - 10.5 K/uL   RBC 5.20 4.22 - 5.81 Mil/uL   Hemoglobin 16.2 13.0 - 17.0 g/dL   HCT 47.1 39.0 - 52.0 %   MCV 90.6 78.0 - 100.0 fl   MCHC 34.3 30.0 - 36.0 g/dL   RDW 14.3 11.5 - 15.5 %   Platelets 216.0 150.0 - 400.0 K/uL   Neutrophils Relative % 65.4 43.0 - 77.0 %   Lymphocytes Relative 21.8 12.0 - 46.0 %   Monocytes Relative 9.7 3.0 - 12.0 %   Eosinophils Relative 2.4 0.0 - 5.0 %   Basophils Relative 0.7 0.0 - 3.0 %   Neutro Abs 5.4 1.4 - 7.7 K/uL   Lymphs Abs 1.8 0.7 - 4.0 K/uL   Monocytes Absolute 0.8 0.1 - 1.0 K/uL   Eosinophils  Absolute 0.2 0.0 - 0.7 K/uL   Basophils Absolute 0.1 0.0 - 0.1 K/uL  POCT Urinalysis Dipstick (Automated)  Result Value Ref Range   Color, UA yellow    Clarity, UA clear    Glucose, UA  Negative Negative   Bilirubin, UA negative    Ketones, UA negative    Spec Grav, UA 1.010 1.010 - 1.025   Blood, UA negative    pH, UA 7.0 5.0 - 8.0   Protein, UA Negative Negative   Urobilinogen, UA 0.2 0.2 or 1.0 E.U./dL   Nitrite, UA negative    Leukocytes, UA Negative Negative   ESS = 2 Assessment & Plan:  This visit occurred during the SARS-CoV-2 public health emergency.  Safety protocols were in place, including screening questions prior to the visit, additional usage of staff PPE, and extensive cleaning of exam room while observing appropriate contact time as indicated for disinfecting solutions.   Problem List Items Addressed This Visit    Transaminitis    Update LFTs.       Gout    Seems to be tolerating allopurinol well - continue this.       Fatigue - Primary    6 wks of ongoing fatigue associated with body aches, nausea. Workup to date normal including vitamin levels, lyme disease testing, blood counts, liver function, thyroid function. Will update inflammatory markers as well as UA, ANA today.  Denies significant concern for OSA, ESS = 2.  No red flags of weight loss, night sweats.  Unclear cause at this time.  No respiratory symptoms. In long time smoker, will ask him to return for CXR as well.  Will also check for SARSCOV2 serology - ?post-covid fatigue. Consider rheum eval given concomitant polyarthritis.       Relevant Orders   Sedimentation rate (Completed)   High sensitivity CRP (Completed)   ANA (Completed)   CBC with Differential/Platelet (Completed)   POCT Urinalysis Dipstick (Automated) (Completed)   SARS CoV2 Serology(COVID19) AB(IgG,IgM),Immunoassay (Completed)   DG Chest 2 View    Other Visit Diagnoses    Polyarthralgia           No orders of the defined  types were placed in this encounter.  Orders Placed This Encounter  Procedures  . SARS CoV2 Serology(COVID19) AB(IgG,IgM),Immunoassay    Order Specific Question:   Is this test for diagnosis or screening    Answer:   Screening    Order Specific Question:   Symptomatic for COVID-19 as defined by CDC    Answer:   No    Order Specific Question:   Hospitalized for COVID-19    Answer:   No    Order Specific Question:   Admitted to ICU for COVID-19    Answer:   No    Order Specific Question:   Previously tested for COVID-19    Answer:   Yes    Order Specific Question:   Resident in a congregate (group) care setting    Answer:   No    Order Specific Question:   Employed in healthcare setting    Answer:   No    Order Specific Question:   Patient Ethnicity:    Answer:   Caucasian  . DG Chest 2 View    Standing Status:   Future    Standing Expiration Date:   06/22/2020    Order Specific Question:   Reason for Exam (SYMPTOM  OR DIAGNOSIS REQUIRED)    Answer:   fatigue, smoker    Order Specific Question:   Preferred imaging location?    Answer:   [facility]    Order Specific Question:   Radiology Contrast Protocol - do NOT remove file path    Answer:   \\  [email or web address]  . Sedimentation rate  . High sensitivity CRP  . ANA  . CBC with Differential/Platelet  . POCT Urinalysis Dipstick (Automated)   Patient Instructions  Labs today Urinalysis today We will be in touch with results.    Follow up plan: Return if symptoms worsen or fail to improve.  Ria Bush, MD

## 2019-06-19 LAB — SARS COV-2 SEROLOGY(COVID-19)AB(IGG,IGM),IMMUNOASSAY
SARS CoV-2 AB IgG: NEGATIVE
SARS CoV-2 IgM: NEGATIVE

## 2019-06-19 LAB — ANA: Anti Nuclear Antibody (ANA): NEGATIVE

## 2019-06-19 NOTE — Telephone Encounter (Signed)
Seen this week  

## 2019-06-23 NOTE — Assessment & Plan Note (Signed)
Seems to be tolerating allopurinol well - continue this.

## 2019-06-23 NOTE — Assessment & Plan Note (Addendum)
6 wks of ongoing fatigue associated with body aches, nausea. Workup to date normal including vitamin levels, lyme disease testing, blood counts, liver function, thyroid function. Will update inflammatory markers as well as UA, ANA today.  Denies significant concern for OSA, ESS = 2.  No red flags of weight loss, night sweats.  Unclear cause at this time.  No respiratory symptoms. In long time smoker, will ask him to return for CXR as well.  Will also check for SARSCOV2 serology - ?post-covid fatigue. Consider rheum eval given concomitant polyarthritis.

## 2019-06-23 NOTE — Assessment & Plan Note (Signed)
Update LFT's 

## 2019-06-24 ENCOUNTER — Telehealth: Payer: Self-pay

## 2019-06-24 DIAGNOSIS — R5383 Other fatigue: Secondary | ICD-10-CM

## 2019-06-24 DIAGNOSIS — M2550 Pain in unspecified joint: Secondary | ICD-10-CM

## 2019-06-24 NOTE — Telephone Encounter (Signed)
Lvm asking pt to call back.  Need to get update on sxs from the weekend and schedule CXR.

## 2019-06-24 NOTE — Telephone Encounter (Signed)
Lvm asking pt to call back.  Need to get update on sxs from the weekend and schedule CXR. [see Labs, Result Notes, 06/18/19]

## 2019-06-25 NOTE — Telephone Encounter (Signed)
Lvm asking pt to call back.  Need to get update on sxs from the weekend and schedule CXR.  Also, sent MyChart message.

## 2019-06-29 ENCOUNTER — Telehealth: Payer: Self-pay | Admitting: Family Medicine

## 2019-06-29 NOTE — Telephone Encounter (Signed)
Patient called the office He stated he was trying to return your call /

## 2019-06-30 NOTE — Telephone Encounter (Signed)
See TE, 06/24/19.

## 2019-06-30 NOTE — Telephone Encounter (Signed)
Pt returning call.  Says he will come in sometime tomorrow for CXR and agrees to rheumatology referral.  FYI to Dr. Darnell Level.

## 2019-06-30 NOTE — Telephone Encounter (Signed)
rheum referral placed.

## 2019-06-30 NOTE — Addendum Note (Signed)
Addended by: Ria Bush on: 06/30/2019 05:44 PM   Modules accepted: Orders

## 2019-07-06 ENCOUNTER — Ambulatory Visit (INDEPENDENT_AMBULATORY_CARE_PROVIDER_SITE_OTHER)
Admission: RE | Admit: 2019-07-06 | Discharge: 2019-07-06 | Disposition: A | Discharge status: Home / Self Care | Payer: BC Managed Care – PPO | Source: Ambulatory Visit | Attending: Family Medicine | Admitting: Family Medicine

## 2019-07-06 DIAGNOSIS — J984 Other disorders of lung: Secondary | ICD-10-CM | POA: Diagnosis not present

## 2019-07-06 DIAGNOSIS — F17200 Nicotine dependence, unspecified, uncomplicated: Secondary | ICD-10-CM | POA: Diagnosis not present

## 2019-07-06 DIAGNOSIS — R5383 Other fatigue: Secondary | ICD-10-CM | POA: Diagnosis not present

## 2019-07-07 ENCOUNTER — Encounter: Payer: Self-pay | Admitting: Family Medicine

## 2019-07-21 ENCOUNTER — Telehealth: Payer: Self-pay | Admitting: Family Medicine

## 2019-07-21 NOTE — Telephone Encounter (Signed)
I received disability form request and need an update from patient. Last seen 06/18/2019. Did he get scheduled with rheumatologist? What are symptoms currently, is he still out of work? Has been out of work since April due to fatigue. Workup largely reassuring. I recommend he return to work at this time unless rheum wants to keep him out.

## 2019-07-21 NOTE — Telephone Encounter (Signed)
Spoke with pt notifying him of Dr. Darnell Level receiving disability form.  Pt has rheumatology appt in about 2 wks.  Still c/o fatigue, body aches and daily HA.  Doing anything for about 10 mins, energy is gone and body aches start.  Pt confirms he has been out of work since April.  I relayed Dr. Synthia Innocent message about returning to work.  Pt says he will wait to see what rheumatology says.  FYI to Dr. Darnell Level.

## 2019-07-22 NOTE — Telephone Encounter (Addendum)
Noted.  Documented 08/14/19 on form, per Dr. Darnell Level.  Faxed form.  FYI to Dr. Darnell Level.

## 2019-07-22 NOTE — Telephone Encounter (Addendum)
When is date of rheum appointment? Will write out until then. [employer] forms filled and placed in Lisa's box.  Needs full time RTW date filled out for day after rheum appointment. If unable to return to work on that date, will need OV to review RTW plan.

## 2019-07-22 NOTE — Telephone Encounter (Signed)
Patient called back.  Patient rheumatology appointment is on 08/13/19 at 1 pm.

## 2019-07-22 NOTE — Telephone Encounter (Addendum)
Spoke with pt relaying Dr. Synthia Innocent message.  Pt verbalizes understanding and will call back with date for rheum appt.   [Disability form (in blue folder) in basket on Lisa's desk]

## 2019-08-09 ENCOUNTER — Encounter: Payer: Self-pay | Admitting: Family Medicine

## 2019-08-10 NOTE — Telephone Encounter (Signed)
Signed and in my outbox. Thank you.

## 2019-08-10 NOTE — Telephone Encounter (Signed)
[employer] paperwork in dr g in box

## 2019-08-11 NOTE — Telephone Encounter (Signed)
Paperwork faxed °

## 2019-08-18 NOTE — Telephone Encounter (Addendum)
Called pt, no answer.  Upcoming Rheum appt 08/27/2019.  See mychart message

## 2019-08-25 ENCOUNTER — Telehealth: Payer: Self-pay | Admitting: Family Medicine

## 2019-08-25 NOTE — Telephone Encounter (Signed)
[employer] disability paperwork in dr g in box

## 2019-08-26 ENCOUNTER — Encounter: Payer: Self-pay | Admitting: Family Medicine

## 2019-09-02 NOTE — Telephone Encounter (Signed)
Dr Darnell Level  Do you still have paperwork

## 2019-09-03 DIAGNOSIS — M19011 Primary osteoarthritis, right shoulder: Secondary | ICD-10-CM | POA: Diagnosis not present

## 2019-09-03 DIAGNOSIS — M47812 Spondylosis without myelopathy or radiculopathy, cervical region: Secondary | ICD-10-CM | POA: Diagnosis not present

## 2019-09-03 DIAGNOSIS — M7989 Other specified soft tissue disorders: Secondary | ICD-10-CM | POA: Diagnosis not present

## 2019-09-03 DIAGNOSIS — M19072 Primary osteoarthritis, left ankle and foot: Secondary | ICD-10-CM | POA: Diagnosis not present

## 2019-09-03 DIAGNOSIS — M79642 Pain in left hand: Secondary | ICD-10-CM | POA: Diagnosis not present

## 2019-09-03 DIAGNOSIS — M549 Dorsalgia, unspecified: Secondary | ICD-10-CM | POA: Diagnosis not present

## 2019-09-03 DIAGNOSIS — M316 Other giant cell arteritis: Secondary | ICD-10-CM | POA: Diagnosis not present

## 2019-09-03 DIAGNOSIS — M533 Sacrococcygeal disorders, not elsewhere classified: Secondary | ICD-10-CM | POA: Diagnosis not present

## 2019-09-03 DIAGNOSIS — M19012 Primary osteoarthritis, left shoulder: Secondary | ICD-10-CM | POA: Diagnosis not present

## 2019-09-03 DIAGNOSIS — M19042 Primary osteoarthritis, left hand: Secondary | ICD-10-CM | POA: Diagnosis not present

## 2019-09-03 DIAGNOSIS — M19041 Primary osteoarthritis, right hand: Secondary | ICD-10-CM | POA: Diagnosis not present

## 2019-09-03 DIAGNOSIS — M19071 Primary osteoarthritis, right ankle and foot: Secondary | ICD-10-CM | POA: Diagnosis not present

## 2019-09-03 DIAGNOSIS — M79672 Pain in left foot: Secondary | ICD-10-CM | POA: Diagnosis not present

## 2019-09-03 DIAGNOSIS — M064 Inflammatory polyarthropathy: Secondary | ICD-10-CM | POA: Diagnosis not present

## 2019-09-03 DIAGNOSIS — M25561 Pain in right knee: Secondary | ICD-10-CM | POA: Diagnosis not present

## 2019-09-03 DIAGNOSIS — M25562 Pain in left knee: Secondary | ICD-10-CM | POA: Diagnosis not present

## 2019-09-03 DIAGNOSIS — M79671 Pain in right foot: Secondary | ICD-10-CM | POA: Diagnosis not present

## 2019-09-03 DIAGNOSIS — M419 Scoliosis, unspecified: Secondary | ICD-10-CM | POA: Diagnosis not present

## 2019-09-03 DIAGNOSIS — M79641 Pain in right hand: Secondary | ICD-10-CM | POA: Diagnosis not present

## 2019-09-03 DIAGNOSIS — M2550 Pain in unspecified joint: Secondary | ICD-10-CM | POA: Diagnosis not present

## 2019-09-03 DIAGNOSIS — M25512 Pain in left shoulder: Secondary | ICD-10-CM | POA: Diagnosis not present

## 2019-09-03 DIAGNOSIS — M25511 Pain in right shoulder: Secondary | ICD-10-CM | POA: Diagnosis not present

## 2019-09-03 DIAGNOSIS — M47816 Spondylosis without myelopathy or radiculopathy, lumbar region: Secondary | ICD-10-CM | POA: Diagnosis not present

## 2019-09-03 DIAGNOSIS — J9811 Atelectasis: Secondary | ICD-10-CM | POA: Diagnosis not present

## 2019-09-03 DIAGNOSIS — J8489 Other specified interstitial pulmonary diseases: Secondary | ICD-10-CM | POA: Diagnosis not present

## 2019-09-03 DIAGNOSIS — M47814 Spondylosis without myelopathy or radiculopathy, thoracic region: Secondary | ICD-10-CM | POA: Diagnosis not present

## 2019-09-03 DIAGNOSIS — R519 Headache, unspecified: Secondary | ICD-10-CM | POA: Diagnosis not present

## 2019-09-04 NOTE — Telephone Encounter (Signed)
Forms filled and in my out box. 

## 2019-09-04 NOTE — Telephone Encounter (Signed)
Spoke with patient - saw Dr Isaias Sakai at [facility] yesterday - has started prednisone 60mg  ?GCA. Had imaging and labwork pending results.  Has been referred to ENT to consider TA biopsy. Has f/u with rheum planned for Sept.

## 2019-09-07 NOTE — Telephone Encounter (Signed)
Paperwork faxed °

## 2019-09-17 ENCOUNTER — Telehealth: Payer: Self-pay | Admitting: Family Medicine

## 2019-09-17 NOTE — Telephone Encounter (Signed)
[employer] faxed paperwork wanting update on pt  Paperwork in dr g in box

## 2019-09-17 NOTE — Telephone Encounter (Signed)
Copy for pt °Copy for scan °

## 2019-09-23 NOTE — Telephone Encounter (Signed)
Lvm asking pt to call back.  Need to relay Dr. G's message.  

## 2019-09-23 NOTE — Telephone Encounter (Signed)
Please call patient - when is next rheum appt and what is his plan for return to work? I believe he had temporal artery biopsy done already. If temporal arteritis, prednisone course should really help and he should be ready to return to work on prednisone.

## 2019-09-24 NOTE — Telephone Encounter (Signed)
Lvm asking pt to call back.  Need to relay Dr. G's message.  

## 2019-09-25 NOTE — Telephone Encounter (Signed)
Lvm asking pt to call back.  Need to relay Dr. G's message.  

## 2019-09-29 NOTE — Telephone Encounter (Signed)
Mailing a letter.

## 2019-10-06 NOTE — Telephone Encounter (Signed)
Dr. Darnell Level has it.

## 2019-10-06 NOTE — Telephone Encounter (Signed)
Patient returned call.  Patient has an appointment for a biopsy on 10/19/19.  Patient's next appointment with Rheumatology is 10/22/19.  Patient's hoping to return to work the first week of October.

## 2019-10-06 NOTE — Telephone Encounter (Signed)
Lattie Haw do you still have paperwork

## 2019-10-06 NOTE — Telephone Encounter (Signed)
Left message asking pt to call office 9/14/rbh

## 2019-10-07 NOTE — Telephone Encounter (Signed)
Filled and in my outbox

## 2019-10-07 NOTE — Telephone Encounter (Signed)
Paperwork faxed °Copy for pt °Copy for scan °

## 2019-10-20 ENCOUNTER — Other Ambulatory Visit: Payer: Self-pay | Admitting: Otolaryngology

## 2019-10-20 DIAGNOSIS — R519 Headache, unspecified: Secondary | ICD-10-CM | POA: Diagnosis not present

## 2019-10-20 DIAGNOSIS — M316 Other giant cell arteritis: Secondary | ICD-10-CM | POA: Diagnosis not present

## 2019-10-21 ENCOUNTER — Other Ambulatory Visit: Payer: Self-pay

## 2019-10-21 ENCOUNTER — Encounter (HOSPITAL_BASED_OUTPATIENT_CLINIC_OR_DEPARTMENT_OTHER): Payer: Self-pay | Admitting: Otolaryngology

## 2019-10-22 ENCOUNTER — Encounter (HOSPITAL_BASED_OUTPATIENT_CLINIC_OR_DEPARTMENT_OTHER)
Admission: RE | Admit: 2019-10-22 | Discharge: 2019-10-22 | Disposition: A | Discharge status: Home / Self Care | Payer: BC Managed Care – PPO | Source: Ambulatory Visit | Attending: Otolaryngology | Admitting: Otolaryngology

## 2019-10-22 ENCOUNTER — Other Ambulatory Visit (HOSPITAL_COMMUNITY)
Admission: RE | Admit: 2019-10-22 | Discharge: 2019-10-22 | Disposition: A | Discharge status: Home / Self Care | Payer: BC Managed Care – PPO | Source: Ambulatory Visit | Attending: Otolaryngology | Admitting: Otolaryngology

## 2019-10-22 DIAGNOSIS — Z01812 Encounter for preprocedural laboratory examination: Secondary | ICD-10-CM | POA: Insufficient documentation

## 2019-10-22 DIAGNOSIS — F17210 Nicotine dependence, cigarettes, uncomplicated: Secondary | ICD-10-CM | POA: Diagnosis not present

## 2019-10-22 DIAGNOSIS — Z01810 Encounter for preprocedural cardiovascular examination: Secondary | ICD-10-CM | POA: Insufficient documentation

## 2019-10-22 DIAGNOSIS — R519 Headache, unspecified: Secondary | ICD-10-CM | POA: Diagnosis not present

## 2019-10-22 DIAGNOSIS — Z20822 Contact with and (suspected) exposure to covid-19: Secondary | ICD-10-CM | POA: Insufficient documentation

## 2019-10-22 DIAGNOSIS — I10 Essential (primary) hypertension: Secondary | ICD-10-CM | POA: Diagnosis not present

## 2019-10-22 LAB — SARS CORONAVIRUS 2 (TAT 6-24 HRS): SARS Coronavirus 2: NEGATIVE

## 2019-10-23 NOTE — Progress Notes (Signed)
EKG reviewed by Dr. Smith Robert, will proceed with surgery as scheduled.

## 2019-10-26 ENCOUNTER — Ambulatory Visit (HOSPITAL_BASED_OUTPATIENT_CLINIC_OR_DEPARTMENT_OTHER)
Admission: RE | Admit: 2019-10-26 | Discharge: 2019-10-26 | Disposition: A | Discharge status: Home / Self Care | Payer: BC Managed Care – PPO | Attending: Otolaryngology | Admitting: Otolaryngology

## 2019-10-26 ENCOUNTER — Other Ambulatory Visit: Payer: Self-pay

## 2019-10-26 ENCOUNTER — Encounter (HOSPITAL_BASED_OUTPATIENT_CLINIC_OR_DEPARTMENT_OTHER)
Admission: RE | Disposition: A | Discharge status: Home / Self Care | Payer: Self-pay | Source: Home / Self Care | Attending: Otolaryngology

## 2019-10-26 ENCOUNTER — Encounter (HOSPITAL_BASED_OUTPATIENT_CLINIC_OR_DEPARTMENT_OTHER): Payer: Self-pay | Admitting: Otolaryngology

## 2019-10-26 ENCOUNTER — Ambulatory Visit (HOSPITAL_BASED_OUTPATIENT_CLINIC_OR_DEPARTMENT_OTHER): Payer: BC Managed Care – PPO | Admitting: Anesthesiology

## 2019-10-26 DIAGNOSIS — R519 Headache, unspecified: Secondary | ICD-10-CM | POA: Diagnosis not present

## 2019-10-26 DIAGNOSIS — I10 Essential (primary) hypertension: Secondary | ICD-10-CM | POA: Insufficient documentation

## 2019-10-26 DIAGNOSIS — Z20822 Contact with and (suspected) exposure to covid-19: Secondary | ICD-10-CM | POA: Diagnosis not present

## 2019-10-26 DIAGNOSIS — J449 Chronic obstructive pulmonary disease, unspecified: Secondary | ICD-10-CM | POA: Diagnosis not present

## 2019-10-26 DIAGNOSIS — R51 Headache with orthostatic component, not elsewhere classified: Secondary | ICD-10-CM | POA: Diagnosis not present

## 2019-10-26 DIAGNOSIS — M316 Other giant cell arteritis: Secondary | ICD-10-CM | POA: Diagnosis not present

## 2019-10-26 DIAGNOSIS — F418 Other specified anxiety disorders: Secondary | ICD-10-CM | POA: Diagnosis not present

## 2019-10-26 DIAGNOSIS — F17210 Nicotine dependence, cigarettes, uncomplicated: Secondary | ICD-10-CM | POA: Insufficient documentation

## 2019-10-26 HISTORY — PX: ARTERY BIOPSY: SHX891

## 2019-10-26 HISTORY — DX: Chronic obstructive pulmonary disease, unspecified: J44.9

## 2019-10-26 SURGERY — BIOPSY TEMPORAL ARTERY
Anesthesia: General | Site: Face | Laterality: Right

## 2019-10-26 MED ORDER — CEFAZOLIN SODIUM-DEXTROSE 2-3 GM-%(50ML) IV SOLR
INTRAVENOUS | Status: DC | PRN
  Administered 2019-10-26: 2 g via INTRAVENOUS

## 2019-10-26 MED ORDER — PROPOFOL 10 MG/ML IV BOLUS
INTRAVENOUS | Status: DC | PRN
  Administered 2019-10-26: 200 mg via INTRAVENOUS

## 2019-10-26 MED ORDER — MIDAZOLAM HCL 2 MG/2ML IJ SOLN
INTRAMUSCULAR | Status: AC
  Filled 2019-10-26: qty 2

## 2019-10-26 MED ORDER — LACTATED RINGERS IV SOLN: INTRAVENOUS | Status: DC

## 2019-10-26 MED ORDER — ONDANSETRON HCL 4 MG/2ML IJ SOLN
INTRAMUSCULAR | Status: AC
  Filled 2019-10-26: qty 2

## 2019-10-26 MED ORDER — FENTANYL CITRATE (PF) 100 MCG/2ML IJ SOLN: 25.0000 ug | INTRAMUSCULAR | Status: DC | PRN

## 2019-10-26 MED ORDER — FENTANYL CITRATE (PF) 100 MCG/2ML IJ SOLN
INTRAMUSCULAR | Status: DC | PRN
Start: 2019-10-26 — End: 2019-10-26
  Administered 2019-10-26 (×2): 50 ug via INTRAVENOUS

## 2019-10-26 MED ORDER — MIDAZOLAM HCL 5 MG/5ML IJ SOLN
INTRAMUSCULAR | Status: DC | PRN
  Administered 2019-10-26: 2 mg via INTRAVENOUS

## 2019-10-26 MED ORDER — DEXAMETHASONE SODIUM PHOSPHATE 4 MG/ML IJ SOLN
INTRAMUSCULAR | Status: DC | PRN
  Administered 2019-10-26: 10 mg via INTRAVENOUS

## 2019-10-26 MED ORDER — EPHEDRINE SULFATE-NACL 50-0.9 MG/10ML-% IV SOSY
PREFILLED_SYRINGE | INTRAVENOUS | Status: DC | PRN
  Administered 2019-10-26: 15 mg via INTRAVENOUS

## 2019-10-26 MED ORDER — PROMETHAZINE HCL 25 MG/ML IJ SOLN: 6.2500 mg | INTRAMUSCULAR | Status: DC | PRN

## 2019-10-26 MED ORDER — DEXAMETHASONE SODIUM PHOSPHATE 10 MG/ML IJ SOLN
INTRAMUSCULAR | Status: AC
  Filled 2019-10-26: qty 1

## 2019-10-26 MED ORDER — LIDOCAINE-EPINEPHRINE 1 %-1:100000 IJ SOLN
INTRAMUSCULAR | Status: DC | PRN
  Administered 2019-10-26: 2 mL

## 2019-10-26 MED ORDER — ONDANSETRON HCL 4 MG/2ML IJ SOLN
INTRAMUSCULAR | Status: DC | PRN
  Administered 2019-10-26: 4 mg via INTRAVENOUS

## 2019-10-26 MED ORDER — LIDOCAINE 2% (20 MG/ML) 5 ML SYRINGE
INTRAMUSCULAR | Status: DC | PRN
  Administered 2019-10-26: 60 mg via INTRAVENOUS

## 2019-10-26 MED ORDER — OXYCODONE HCL 5 MG/5ML PO SOLN: 5.0000 mg | Freq: Once | ORAL | Status: DC | PRN

## 2019-10-26 MED ORDER — LIDOCAINE 2% (20 MG/ML) 5 ML SYRINGE
INTRAMUSCULAR | Status: AC
  Filled 2019-10-26: qty 5

## 2019-10-26 MED ORDER — OXYCODONE HCL 5 MG PO TABS: 5.0000 mg | ORAL_TABLET | Freq: Once | ORAL | Status: DC | PRN

## 2019-10-26 MED ORDER — MEPERIDINE HCL 25 MG/ML IJ SOLN: 6.2500 mg | INTRAMUSCULAR | Status: DC | PRN

## 2019-10-26 MED ORDER — FENTANYL CITRATE (PF) 100 MCG/2ML IJ SOLN
INTRAMUSCULAR | Status: AC
  Filled 2019-10-26: qty 2

## 2019-10-26 MED ORDER — CEFAZOLIN SODIUM 1 G IJ SOLR
INTRAMUSCULAR | Status: AC
  Filled 2019-10-26: qty 20

## 2019-10-26 SURGICAL SUPPLY — 40 items
BLADE CLIPPER SURG (BLADE) ×2 IMPLANT
BLADE SURG 15 STRL LF DISP TIS (BLADE) ×1 IMPLANT
BLADE SURG 15 STRL SS (BLADE) ×1
CANISTER SUCT 1200ML W/VALVE (MISCELLANEOUS) IMPLANT
CORD BIPOLAR FORCEPS 12FT (ELECTRODE) IMPLANT
COVER BACK TABLE 60X90IN (DRAPES) ×2 IMPLANT
COVER MAYO STAND STRL (DRAPES) ×2 IMPLANT
COVER PROBE W GEL 5X96 (DRAPES) ×2 IMPLANT
COVER WAND RF STERILE (DRAPES) IMPLANT
DERMABOND ADVANCED (GAUZE/BANDAGES/DRESSINGS)
DERMABOND ADVANCED .7 DNX12 (GAUZE/BANDAGES/DRESSINGS) IMPLANT
DRAPE SURG 17X23 STRL (DRAPES) IMPLANT
DRAPE U-SHAPE 76X120 STRL (DRAPES) ×2 IMPLANT
ELECT COATED BLADE 2.86 ST (ELECTRODE) ×2 IMPLANT
ELECT NEEDLE BLADE 2-5/6 (NEEDLE) IMPLANT
ELECT REM PT RETURN 9FT ADLT (ELECTROSURGICAL) ×2
ELECTRODE REM PT RTRN 9FT ADLT (ELECTROSURGICAL) ×1 IMPLANT
GAUZE SPONGE 4X4 12PLY STRL LF (GAUZE/BANDAGES/DRESSINGS) IMPLANT
GLOVE BIO SURGEON STRL SZ7.5 (GLOVE) ×2 IMPLANT
GOWN STRL REUS W/ TWL LRG LVL3 (GOWN DISPOSABLE) ×2 IMPLANT
GOWN STRL REUS W/TWL LRG LVL3 (GOWN DISPOSABLE) ×2
NEEDLE HYPO 25X1 1.5 SAFETY (NEEDLE) IMPLANT
NEEDLE PRECISIONGLIDE 27X1.5 (NEEDLE) ×2 IMPLANT
NS IRRIG 1000ML POUR BTL (IV SOLUTION) ×2 IMPLANT
PACK BASIN DAY SURGERY FS (CUSTOM PROCEDURE TRAY) ×2 IMPLANT
PENCIL SMOKE EVACUATOR (MISCELLANEOUS) ×2 IMPLANT
SHEET MEDIUM DRAPE 40X70 STRL (DRAPES) IMPLANT
SPONGE GAUZE 2X2 8PLY STRL LF (GAUZE/BANDAGES/DRESSINGS) IMPLANT
SUCTION FRAZIER HANDLE 10FR (MISCELLANEOUS)
SUCTION TUBE FRAZIER 10FR DISP (MISCELLANEOUS) IMPLANT
SUT SILK 3 0 TIES 17X18 (SUTURE) ×1
SUT SILK 3-0 18XBRD TIE BLK (SUTURE) ×1 IMPLANT
SUT SILK 4 0 TIES 17X18 (SUTURE) IMPLANT
SUT VIC AB 4-0 P-3 18XBRD (SUTURE) ×1 IMPLANT
SUT VIC AB 4-0 P3 18 (SUTURE) ×1
SWABSTICK POVIDONE IODINE SNGL (MISCELLANEOUS) IMPLANT
SYR CONTROL 10ML LL (SYRINGE) ×2 IMPLANT
TOWEL GREEN STERILE FF (TOWEL DISPOSABLE) ×2 IMPLANT
TRAY DSU PREP LF (CUSTOM PROCEDURE TRAY) ×2 IMPLANT
TUBE CONNECTING 20X1/4 (TUBING) IMPLANT

## 2019-10-26 NOTE — Anesthesia Procedure Notes (Signed)
Procedure Name: LMA Insertion Date/Time: 10/26/2019 10:05 AM Performed by: Lieutenant Diego, CRNA Pre-anesthesia Checklist: Patient identified, Emergency Drugs available, Suction available and Patient being monitored Patient Re-evaluated:Patient Re-evaluated prior to induction Oxygen Delivery Method: Circle system utilized Preoxygenation: Pre-oxygenation with 100% oxygen Induction Type: IV induction Ventilation: Mask ventilation without difficulty LMA: LMA inserted LMA Size: 4.0 Number of attempts: 1 Placement Confirmation: positive ETCO2 and breath sounds checked- equal and bilateral Tube secured with: Tape Dental Injury: Teeth and Oropharynx as per pre-operative assessment

## 2019-10-26 NOTE — Discharge Instructions (Addendum)
The patient may resume all his previous activities and diet.  No postop activity restriction.  He may use Tylenol/ibuprofen as needed for pain.  The patient may follow-up in my office as needed.   Post Anesthesia Home Care Instructions  Activity: Get plenty of rest for the remainder of the day. A responsible individual must stay with you for 24 hours following the procedure.  For the next 24 hours, DO NOT: -Drive a car -Paediatric nurse -Drink alcoholic beverages -Take any medication unless instructed by your physician -Make any legal decisions or sign important papers.  Meals: Start with liquid foods such as gelatin or soup. Progress to regular foods as tolerated. Avoid greasy, spicy, heavy foods. If nausea and/or vomiting occur, drink only clear liquids until the nausea and/or vomiting subsides. Call your physician if vomiting continues.  Special Instructions/Symptoms: Your throat may feel dry or sore from the anesthesia or the breathing tube placed in your throat during surgery. If this causes discomfort, gargle with warm salt water. The discomfort should disappear within 24 hours.  If you had a scopolamine patch placed behind your ear for the management of post- operative nausea and/or vomiting:  1. The medication in the patch is effective for 72 hours, after which it should be removed.  Wrap patch in a tissue and discard in the trash. Wash hands thoroughly with soap and water. 2. You may remove the patch earlier than 72 hours if you experience unpleasant side effects which may include dry mouth, dizziness or visual disturbances. 3. Avoid touching the patch. Wash your hands with soap and water after contact with the patch.

## 2019-10-26 NOTE — Transfer of Care (Signed)
Immediate Anesthesia Transfer of Care Note  Patient: Daniel May  Procedure(s) Performed: BIOPSY TEMPORAL ARTERY (Right Face)  Patient Location: PACU  Anesthesia Type:General  Level of Consciousness: awake  Airway & Oxygen Therapy: Patient Spontanous Breathing and Patient connected to face mask oxygen  Post-op Assessment: Report given to RN and Post -op Vital signs reviewed and stable  Post vital signs: Reviewed and stable  Last Vitals:  Vitals Value Taken Time  BP    Temp    Pulse 121 10/26/19 1057  Resp 17 10/26/19 1057  SpO2 97 % 10/26/19 1057  Vitals shown include unvalidated device data.  Last Pain:  Vitals:   10/26/19 0845  TempSrc: Oral  PainSc: 1       Patients Stated Pain Goal: 3 (15/50/27 1423)  Complications: No complications documented.

## 2019-10-26 NOTE — Op Note (Signed)
DATE OF PROCEDURE: 10/26/2019  OPERATIVE REPORT   SURGEON: Leta Baptist, MD  PREOPERATIVE DIAGNOSIS: Right temporal headaches, possible temporal arteritis  POSTOPERATIVE DIAGNOSIS: Right temporal headaches, possible temporal arteritis  PROCEDURES PERFORMED: 1. Right temporal artery biopsy.  ANESTHESIA: General laryngeal mask anesthesia.  COMPLICATIONS: None.  ESTIMATED BLOOD LOSS: Minimal.  INDICATION FOR PROCEDURE:  Daniel May is a 54 y.o. male with a history of daily temporal headaches for the past 6 months. His headaches were worse on the right side. The patient was also having fatigue and joint pain. His symptoms were concerning for temporal arteritis. Based on the above findings, the decision was made for the patient to undergo the above-stated procedure. The risks, benefits, alternatives, and details of the procedures were discussed with the patient. Questions were invited and answered. Informed consent was obtained.  DESCRIPTION OF PROCEDURE: The patient was taken to the operating room and placed supine on the operating table. General laryngeal mask anesthesia was induced by the anesthesiologist.  The patient was positioned and prepped and draped in the standard fashion for right temporal artery biopsy.  The location of the right temporal artery was identified using the Doppler device.  1% lidocaine with 1-100,000 epinephrine was infiltrated at the planned site of incision.  A vertical incision was made over the right temporal artery.  The dissection was carried down to the level of the artery.  An approximately 1 cm segment of the temporal artery was resected and sent to the pathology department for permanent histologic identification.  The surgical site was copiously irrigated.  Hemostasis was achieved with the Bovie electrocautery device.  The care of the patient was turned over to the anesthesiologist. The patient was awakened from anesthesia without  difficulty. He was extubated and transferred to the recovery room in good condition.  OPERATIVE FINDINGS: A segment of the right temporal artery was identified and resected.  SPECIMEN: Right temporal artery.  FOLLOWUP CARE: The patient will be discharged home once he is awake and alert.   Abrian Hanover Wooi Jaiden Wahab. MD

## 2019-10-26 NOTE — Anesthesia Preprocedure Evaluation (Addendum)
Anesthesia Evaluation  Patient identified by MRN, date of birth, ID band Patient awake    Reviewed: Allergy & Precautions, NPO status , Patient's Chart, lab work & pertinent test results  Airway Mallampati: II  TM Distance: >3 FB Neck ROM: Full    Dental  (+) Dental Advisory Given, Teeth Intact   Pulmonary COPD, Current Smoker and Patient abstained from smoking.,    Pulmonary exam normal breath sounds clear to auscultation       Cardiovascular hypertension, Pt. on medications  Rhythm:Regular Rate:Normal     Neuro/Psych  Headaches, PSYCHIATRIC DISORDERS Anxiety Depression    GI/Hepatic negative GI ROS, Neg liver ROS,   Endo/Other  negative endocrine ROS  Renal/GU negative Renal ROS     Musculoskeletal negative musculoskeletal ROS (+)   Abdominal (+) + obese,   Peds  Hematology negative hematology ROS (+)   Anesthesia Other Findings   Reproductive/Obstetrics                           Anesthesia Physical Anesthesia Plan  ASA: III  Anesthesia Plan: General   Post-op Pain Management:    Induction: Intravenous  PONV Risk Score and Plan: 1 and Ondansetron, Dexamethasone, Treatment may vary due to age or medical condition and Midazolam  Airway Management Planned: LMA  Additional Equipment: None  Intra-op Plan:   Post-operative Plan: Extubation in OR  Informed Consent: I have reviewed the patients History and Physical, chart, labs and discussed the procedure including the risks, benefits and alternatives for the proposed anesthesia with the patient or authorized representative who has indicated his/her understanding and acceptance.     Dental advisory given  Plan Discussed with: CRNA  Anesthesia Plan Comments:        Anesthesia Quick Evaluation

## 2019-10-26 NOTE — H&P (Signed)
Cc: Temporal headaches  HPI: The patient is a 54 y/o male who presents today for evaluation of temporal headaches. The patient is seen in consultation requested by Dr. Lahoma Rocker. The patient has noted almost daily temporal headaches for the past 6 months. His headaches are worse on the right side. The patient is also having fatigue and joint pain. He was referred for temporal artery biopsy. The patient has been on high dose prednisone for 6 weeks with mild improvement. His headaches still occur daily.   The patient's review of systems (constitutional, eyes, ENT, cardiovascular, respiratory, GI, musculoskeletal, skin, neurologic, psychiatric, endocrine, hematologic, allergic) is noted in the ROS questionnaire.  It is reviewed with the patient.   Family health history: No HTM , DM, CAD, hearing loss or bleeding disorder.  Major events: None.  Ongoing medical problems: Hypertension, headaches, depression.  Social history: The patient is married. He smokes 2 pack of cigarettes a day. He denies the use of alcohol or illegal drugs.   Exam: General: Communicates without difficulty, well nourished, no acute distress. Head: Normocephalic, no evidence injury, no tenderness, facial buttresses intact without stepoff. Eyes: PERRL, EOMI. No scleral icterus, conjunctivae clear. Neuro: CN II exam reveals vision grossly intact.  No nystagmus at any point of gaze. Ears: Auricles well formed without lesions.  Ear canals are intact without mass or lesion.  No erythema or edema is appreciated.  The TMs are intact without fluid. Nose: External evaluation reveals normal support and skin without lesions.  Dorsum is intact.  Anterior rhinoscopy reveals healthy pink mucosa over anterior aspect of inferior turbinates and intact septum.  No purulence noted. Oral:  Oral cavity and oropharynx are intact, symmetric, without erythema or edema.  Mucosa is moist without lesions. Neck: Full range of motion without pain.  There is no  significant lymphadenopathy.  No masses palpable.  Thyroid bed within normal limits to palpation.  Parotid glands and submandibular glands equal bilaterally without mass.  Trachea is midline. Neuro:  CN 2-12 grossly intact. Gait normal. Vestibular: No nystagmus at any point of gaze. The cerebellar examination is unremarkable.   Assessment Temporal headaches with concern for temporal arteritis. The patient's ENT exam is normal today.   Plan  1. The physical exam findings are reviewed with the patient. 2. Plan for temporal artery biopsy. The risks, benefits, alternatives, and details of the procedure are reviewed with the patient. Questions are invited and answered. 3. The patient is interested in proceeding with the procedure.  We will schedule the procedure in accordance with the family schedule.

## 2019-10-26 NOTE — Anesthesia Postprocedure Evaluation (Addendum)
Anesthesia Post Note  Patient: Daniel May  Procedure(s) Performed: BIOPSY TEMPORAL ARTERY (Right Face)     Patient location during evaluation: PACU Anesthesia Type: General Level of consciousness: sedated and patient cooperative Pain management: pain level controlled Vital Signs Assessment: post-procedure vital signs reviewed and stable Respiratory status: spontaneous breathing Cardiovascular status: stable Anesthetic complications: no Comments: Tachycardia in PACU. I was made aware after pt was in phase II, IV was out and he was getting dressed. Pt states he has high resting heart rate, but also likely related to ephedrine given intraop. Discussed F/U with PCP to monitor HR and possible need for beta blocker. Requested that his HR be monitored before D/C   No complications documented.  Last Vitals:  Vitals:   10/26/19 1100 10/26/19 1130  BP: (!) 142/93 120/89  Pulse: (!) 120 (!) 115  Resp: 16 18  Temp:  36.6 C  SpO2: 96% 94%    Last Pain:  Vitals:   10/26/19 1130  TempSrc:   PainSc: 0-No pain                 Nolon Nations

## 2019-10-27 ENCOUNTER — Encounter (HOSPITAL_BASED_OUTPATIENT_CLINIC_OR_DEPARTMENT_OTHER): Payer: Self-pay | Admitting: Otolaryngology

## 2019-10-27 ENCOUNTER — Telehealth: Payer: Self-pay | Admitting: Family Medicine

## 2019-10-27 LAB — SURGICAL PATHOLOGY

## 2019-10-27 NOTE — Telephone Encounter (Signed)
Pt had biopsy yesterday and has rheumatology appt next Monday to hopefully find out results.  He just wanted to update you.  Thank you!

## 2019-10-28 NOTE — Telephone Encounter (Addendum)
Received [employer] forms to fill out for patient - but it is a surgical form. I started filling out before realizing this.  I wouldn't fill this out - his ENT surgeon who did the biopsy would. However, the surgery he had (temporal artery biopsy) doesn't qualify him for short term disability regardless. Form on my desk.   Is he still out of work and under who's recommendation?  I thought plan was to return to work after biopsy.   I recommend he keep rheum appt Monday to discuss plan from here. I don't think he qualifies for ongoing time out of work.

## 2019-10-28 NOTE — Telephone Encounter (Signed)
Noted! Thank you

## 2019-10-29 NOTE — Telephone Encounter (Signed)
Lvm asking pt to call back.  Need to relay Dr. G's message.  

## 2019-10-29 NOTE — Telephone Encounter (Signed)
Pt returning call.  I relayed Dr. Synthia Innocent message.  Pt verbalizes understanding stating he just had bx on 10/26/19 so he is still out of work.  Previous appt was just a consultation.  However, he will contact ENT surgeon. FYI to Dr. Darnell Level.

## 2019-11-02 ENCOUNTER — Other Ambulatory Visit: Payer: Self-pay | Admitting: Rheumatology

## 2019-11-02 DIAGNOSIS — M1990 Unspecified osteoarthritis, unspecified site: Secondary | ICD-10-CM

## 2019-11-02 DIAGNOSIS — M064 Inflammatory polyarthropathy: Secondary | ICD-10-CM | POA: Diagnosis not present

## 2019-11-02 DIAGNOSIS — M533 Sacrococcygeal disorders, not elsewhere classified: Secondary | ICD-10-CM

## 2019-11-02 DIAGNOSIS — R5383 Other fatigue: Secondary | ICD-10-CM | POA: Diagnosis not present

## 2019-11-02 DIAGNOSIS — M109 Gout, unspecified: Secondary | ICD-10-CM | POA: Diagnosis not present

## 2019-11-02 DIAGNOSIS — R768 Other specified abnormal immunological findings in serum: Secondary | ICD-10-CM | POA: Diagnosis not present

## 2019-11-02 DIAGNOSIS — M359 Systemic involvement of connective tissue, unspecified: Secondary | ICD-10-CM | POA: Diagnosis not present

## 2019-11-02 DIAGNOSIS — J984 Other disorders of lung: Secondary | ICD-10-CM | POA: Diagnosis not present

## 2019-11-06 ENCOUNTER — Other Ambulatory Visit: Payer: Self-pay | Admitting: Family Medicine

## 2019-11-12 ENCOUNTER — Other Ambulatory Visit: Payer: Self-pay | Admitting: Rheumatology

## 2019-11-12 DIAGNOSIS — Z77018 Contact with and (suspected) exposure to other hazardous metals: Secondary | ICD-10-CM

## 2019-11-20 ENCOUNTER — Encounter: Payer: Self-pay | Admitting: Family Medicine

## 2019-11-20 DIAGNOSIS — M1990 Unspecified osteoarthritis, unspecified site: Secondary | ICD-10-CM | POA: Insufficient documentation

## 2019-11-25 ENCOUNTER — Telehealth: Payer: Self-pay | Admitting: Family Medicine

## 2019-11-25 NOTE — Telephone Encounter (Signed)
Spoke to pt regard [employer] paperwork from [facility]. He stated dr g Areil took pt out of work.  He is aware that he needs to get dr Joya Salm to fill out paperwork.

## 2019-12-03 ENCOUNTER — Other Ambulatory Visit: Payer: Self-pay

## 2019-12-03 ENCOUNTER — Ambulatory Visit
Admission: RE | Admit: 2019-12-03 | Discharge: 2019-12-03 | Disposition: A | Discharge status: Home / Self Care | Payer: BC Managed Care – PPO | Source: Ambulatory Visit | Attending: Rheumatology | Admitting: Rheumatology

## 2019-12-03 ENCOUNTER — Other Ambulatory Visit: Payer: BC Managed Care – PPO

## 2019-12-03 DIAGNOSIS — M48061 Spinal stenosis, lumbar region without neurogenic claudication: Secondary | ICD-10-CM | POA: Diagnosis not present

## 2019-12-03 DIAGNOSIS — R5383 Other fatigue: Secondary | ICD-10-CM | POA: Diagnosis not present

## 2019-12-03 DIAGNOSIS — M5127 Other intervertebral disc displacement, lumbosacral region: Secondary | ICD-10-CM | POA: Diagnosis not present

## 2019-12-03 DIAGNOSIS — Z77018 Contact with and (suspected) exposure to other hazardous metals: Secondary | ICD-10-CM

## 2019-12-03 DIAGNOSIS — M533 Sacrococcygeal disorders, not elsewhere classified: Secondary | ICD-10-CM | POA: Diagnosis not present

## 2019-12-03 DIAGNOSIS — Z01818 Encounter for other preprocedural examination: Secondary | ICD-10-CM | POA: Diagnosis not present

## 2019-12-03 DIAGNOSIS — M1990 Unspecified osteoarthritis, unspecified site: Secondary | ICD-10-CM

## 2019-12-08 DIAGNOSIS — M109 Gout, unspecified: Secondary | ICD-10-CM | POA: Diagnosis not present

## 2019-12-08 DIAGNOSIS — R5383 Other fatigue: Secondary | ICD-10-CM | POA: Diagnosis not present

## 2019-12-08 DIAGNOSIS — M1990 Unspecified osteoarthritis, unspecified site: Secondary | ICD-10-CM | POA: Diagnosis not present

## 2019-12-08 DIAGNOSIS — M064 Inflammatory polyarthropathy: Secondary | ICD-10-CM | POA: Diagnosis not present

## 2019-12-14 ENCOUNTER — Telehealth: Payer: BC Managed Care – PPO | Admitting: Family Medicine

## 2019-12-22 ENCOUNTER — Telehealth: Payer: Self-pay | Admitting: Family Medicine

## 2019-12-22 NOTE — Telephone Encounter (Signed)
Pt returning call.  Relayed Dr. Synthia Innocent message.  Pt verbalizes understanding.

## 2019-12-22 NOTE — Telephone Encounter (Signed)
plz change tomorrow's appointment to in-office visit as I recommend a physical exam in person to discuss work status.

## 2019-12-22 NOTE — Telephone Encounter (Addendum)
Changed visit to OV.    Lvm asking pt to call back.  Need to relay Dr. Synthia Innocent message explaining reason for visit type.

## 2019-12-23 ENCOUNTER — Other Ambulatory Visit: Payer: Self-pay

## 2019-12-23 ENCOUNTER — Ambulatory Visit: Payer: BC Managed Care – PPO | Admitting: Family Medicine

## 2019-12-23 ENCOUNTER — Encounter: Payer: Self-pay | Admitting: Family Medicine

## 2019-12-23 VITALS — BP 130/90 | HR 117 | Temp 97.6°F | Ht 70.0 in | Wt 269.3 lb

## 2019-12-23 DIAGNOSIS — R000 Tachycardia, unspecified: Secondary | ICD-10-CM

## 2019-12-23 DIAGNOSIS — I471 Supraventricular tachycardia: Secondary | ICD-10-CM

## 2019-12-23 DIAGNOSIS — R519 Headache, unspecified: Secondary | ICD-10-CM | POA: Diagnosis not present

## 2019-12-23 DIAGNOSIS — I10 Essential (primary) hypertension: Secondary | ICD-10-CM

## 2019-12-23 DIAGNOSIS — R5383 Other fatigue: Secondary | ICD-10-CM | POA: Diagnosis not present

## 2019-12-23 DIAGNOSIS — F3289 Other specified depressive episodes: Secondary | ICD-10-CM

## 2019-12-23 DIAGNOSIS — M1A09X0 Idiopathic chronic gout, multiple sites, without tophus (tophi): Secondary | ICD-10-CM

## 2019-12-23 DIAGNOSIS — M1990 Unspecified osteoarthritis, unspecified site: Secondary | ICD-10-CM

## 2019-12-23 DIAGNOSIS — R9431 Abnormal electrocardiogram [ECG] [EKG]: Secondary | ICD-10-CM

## 2019-12-23 MED ORDER — METOPROLOL SUCCINATE ER 25 MG PO TB24: 12.5000 mg | ORAL_TABLET | Freq: Every day | ORAL | 1 refills | Status: DC

## 2019-12-23 NOTE — Progress Notes (Addendum)
Patient ID: Daniel May, male    DOB: 17-Sep-1965, 54 y.o.   MRN: [id number]  This visit was conducted in person.  BP 130/90 (BP Location: Left Arm, Patient Position: Sitting, Cuff Size: Large)   Pulse (!) 117   Temp 97.6 F (36.4 C) (Temporal)   Ht 5\' 10"  (1.778 m)   Wt 269 lb 5 oz (122.2 kg)   SpO2 94%   BMI 38.64 kg/m   BP Readings from Last 3 Encounters:  12/23/19 130/90  10/26/19 120/89  06/18/19 128/76   Pulse Readings from Last 3 Encounters:  12/23/19 (!) 117  10/26/19 (!) 104  06/18/19 98    CC: HA Subjective:   HPI: Daniel May is a 54 y.o. male presenting on 12/23/2019 for Headache (Here to discuss tx plan for HA and fatigue and returning to work.  Sxs have continued for 6 mos.)   See prior notes for details.  6 months of fatigue associated with bitemporal headache R>L, polyarthralgia. Saw rheumatology Dr Kathlene November 08/2019 ([facility]) - ANA+, dsDNA+, anticarbamylated protein+, partial R SIJ fusion on xray, temporal artery biopsy negative for GCA (10/26/2019), HLAB27 negative, ENA, smith, C3/C4, RF, CCP all negative - didn't meet criteria for RA or lupus. Seen in rheum f/u 11/2019 - thought inflammatory arthritis overlap CTD disease - started on MTX 10mg  weekly + folic acid 1mg  daily, consider neuro for HA eval. Reported ongoing fatigue, not thought rheumatologic in nature. Notes MTX causing loose stools. Weight gain noted.  Remaining tachycardia.   Headaches significantly improved on prednisone. Planned prednisone taper through rheum - currently on 20mg  daily.  Ongoing lower back pain from OA - SI joint MRI showed lumbar facet disease and bulging discs, to consider ESI. Declines PM&R eval at this time.   Fully out of work since May 2021.  Would like to try to return to work Dec 15th half time for 2 wks (6 hours) then increase to full time. Full time is 12 hours/day 7am-7pm, 5d/wk rotating days.   Continues 1 ppd smoking.  Denies chest pain/tightness, dyspnea, significant leg  swelling, palpitations or dizziness. Fmhx CHF (mother) but no CAD. Frustrated with physical limitations.      Relevant past medical, surgical, family and social history reviewed and updated as indicated. Interim medical history since our last visit reviewed. Allergies and medications reviewed and updated. Outpatient Medications Prior to Visit  Medication Sig Dispense Refill  . albuterol (VENTOLIN HFA) 108 (90 Base) MCG/ACT inhaler INHALE 2 PUFFS INTO THE LUNGS EVERY 6 HOURS AS NEEDED FOR WHEEZING OR SHORTNESS OF BREATH 54 g 0  . allopurinol (ZYLOPRIM) 100 MG tablet Take 1 tablet (100 mg total) by mouth daily. 90 tablet 3  . folic acid (FOLVITE) 1 MG tablet Take 1 mg by mouth daily.    Marland Kitchen losartan (COZAAR) 50 MG tablet Take 1 tablet (50 mg total) by mouth daily. 90 tablet 3  . methotrexate 2.5 MG tablet Take 10 mg by mouth once a week.    . Multiple Vitamins-Minerals (MULTIVITAMIN ADULT PO) Take by mouth.    . Omega-3 Fatty Acids (FISH OIL) 1200 MG CAPS Take 2 capsules by mouth daily.    Marland Kitchen PARoxetine (PAXIL) 40 MG tablet Take 1 tablet (40 mg total) by mouth daily. 90 tablet 3  . predniSONE (DELTASONE) 10 MG tablet Take 20 mg by mouth daily with breakfast.     . vitamin C (ASCORBIC ACID) 500 MG tablet Take 500 mg by mouth daily.  No facility-administered medications prior to visit.     Per HPI unless specifically indicated in ROS section below Review of Systems Objective:  BP 130/90 (BP Location: Left Arm, Patient Position: Sitting, Cuff Size: Large)   Pulse (!) 117   Temp 97.6 F (36.4 C) (Temporal)   Ht 5\' 10"  (1.778 m)   Wt 269 lb 5 oz (122.2 kg)   SpO2 94%   BMI 38.64 kg/m   Wt Readings from Last 3 Encounters:  12/23/19 269 lb 5 oz (122.2 kg)  10/26/19 264 lb 1.8 oz (119.8 kg)  06/18/19 246 lb 8 oz (111.8 kg)      Physical Exam Vitals and nursing note reviewed.  Constitutional:      Appearance: Normal appearance. He is obese. He is not ill-appearing.  Cardiovascular:       Rate and Rhythm: Regular rhythm. Tachycardia present.     Pulses: Normal pulses.     Heart sounds: Normal heart sounds. No murmur heard.   Pulmonary:     Effort: Pulmonary effort is normal. No respiratory distress.     Breath sounds: Normal breath sounds. No wheezing, rhonchi or rales.  Musculoskeletal:        General: Normal range of motion.     Right lower leg: No edema.     Left lower leg: No edema.     Comments: No active synovitis  Skin:    General: Skin is warm and dry.     Findings: No rash.  Neurological:     Mental Status: He is alert.  Psychiatric:        Mood and Affect: Mood normal.        Behavior: Behavior normal.       Results for orders placed or performed during the hospital encounter of 10/26/19  Surgical pathology  Result Value Ref Range   SURGICAL PATHOLOGY      SURGICAL PATHOLOGY CASE: [id number] PATIENT: [name] Surgical Pathology Report     Clinical History: headache with orthostatic component (cm)   FINAL MICROSCOPIC DIAGNOSIS:  A. TEMPORAL ARTERY, RIGHT, BIOPSY: -  Benign vascular wall without evidence of inflammation   GROSS DESCRIPTION:  The specimen is received in formalin and consists of a tubular segment of tan-pink soft tissue, measuring 0.6 cm in length by 0.2 cm in diameter.  Specimen is bisected and entirely submitted in 1 cassette. Craig Staggers 07/26/2019)    Final Diagnosis performed by Thressa Sheller, MD.   Electronically signed 10/27/2019 Technical and / or Professional components performed at [facility]. [facility], [place] [address] [place], [place] [address].  Immunohistochemistry Technical component (if applicable) was performed at [facility]. [address] [place], [place], [place] [address].   IMMUNOHISTOCHEMISTRY DISCLAIMER (if applicable): Some of these immunohistoche mical stains may have been developed and the performance characteristics determine by [facility]. Some may  not have been cleared or approved by the U.S. Food and Drug Administration. The FDA has determined that such clearance or approval is not necessary. This test is used for clinical purposes. It should not be regarded as investigational or for research. This laboratory is certified under the [place] (CLIA-88) as qualified to perform high complexity clinical laboratory testing.  The controls stained appropriately.    Depression screen [facility] 2/9 05/25/2019  Decreased Interest 0  Down, Depressed, Hopeless 0  PHQ - 2 Score 0   EKG - sinus tachycardia 100s, LAD with LAFB, q waves inferiorly, t  waves anteroseptally and low voltage in limb leads  Assessment & Plan:  This visit occurred during the SARS-CoV-2 public health emergency.  Safety protocols were in place, including screening questions prior to the visit, additional usage of staff PPE, and extensive cleaning of exam room while observing appropriate contact time as indicated for disinfecting solutions.   Problem List Items Addressed This Visit    Tachycardia    Pulse chronically runs high - anticipate component of deconditioning. Start Toprol XL 12.5mg  daily. Check EKG today. Encouraged good hydration, limiting caffeine.       Relevant Orders   EKG 12-Lead (Completed)   Ambulatory referral to Neurology   Nonintractable episodic headache    Chronic, persistent daily headache that started late 04/2019, significant improvement while on prednisone but planning taper through rheum.  Not consistent with migraine, cluster headache, TTH.  Reassuringly negative temporal artery biopsy 10/2019.  Will refer to neurology for further evaluation of headache       Relevant Medications   metoprolol succinate (TOPROL-XL) 25 MG 24 hr tablet   Other Relevant Orders   Ambulatory referral to Neurology   Inflammatory arthritis - Primary    Sees Dr Kathlene November rheum ([facility]).  Thought overlap inflammatory arthritis and  connective tissue disease. Now on MTX weeklyl along with planned slow prednisone taper.  Appreciate rheum care.       Relevant Medications   methotrexate 2.5 MG tablet   HTN (hypertension)    Chronic, stable on losartan 50mg  daily. Add toprol XL 12.5mg  daily      Relevant Medications   metoprolol succinate (TOPROL-XL) 25 MG 24 hr tablet   Gout    Continues allopurinol 100mg  daily without recent gout flare      Fatigue    Ongoing, with HA and polyarthralgias. Rheum didn't think necessarily related to connective tissue disease.  Consider sleep study given body habitus and endorsed daytime somnolence, although ESS low (5).       Depression    Acutely worse - situational related to recent symptoms.  Continues paxil 40mg  daily.       Abnormal EKG    LAFB, q waves inferiorly. Patient denies anginal symptoms.  Recommend further evaluation by cardiology - he desires to focus on current symptoms of headache, arthritis, fatigue at this time.  Start toprol XL 12.5mg  daily, continue losartan 50mg  daily.  Consider starting aspirin.           Meds ordered this encounter  Medications  . metoprolol succinate (TOPROL-XL) 25 MG 24 hr tablet    Sig: Take 0.5 tablets (12.5 mg total) by mouth daily.    Dispense:  45 tablet    Refill:  1   Orders Placed This Encounter  Procedures  . Ambulatory referral to Neurology    Referral Priority:   Routine    Referral Type:   Consultation    Referral Reason:   Specialty Services Required    Requested Specialty:   Neurology    Number of Visits Requested:   1  . EKG 12-Lead    Patient instructions: Letter for work provided today. Plan return to work 01/06/2020 1/2 time for 2 weeks then return to full time.  For ongoing headache I would like to refer you to neurologist for further evaluation. I will also have you seen by sleep doctor for ongoing sleepiness (this may be through same neurology office).  Return in 3 months for follow up visit.    Heart rate stays elevated - start toprol  XL 12.5mg  daily (1/2 tablet daily). Work on good water intake.  Work on quitting smoking.   Follow up plan: Return in about 3 months (around 03/22/2020) for follow up visit.  Ria Bush, MD

## 2019-12-23 NOTE — Patient Instructions (Addendum)
Letter for work provided today. Plan return to work 01/06/2020 1/2 time for 2 weeks then return to full time.  For ongoing headache I would like to refer you to neurologist for further evaluation. I will also have you seen by sleep doctor for ongoing sleepiness (this may be through same neurology office).  Return in 3 months for follow up visit.  Heart rate stays elevated - start toprol XL 12.5mg  daily (1/2 tablet daily). Work on good water intake.  Work on quitting smoking.

## 2019-12-24 DIAGNOSIS — R000 Tachycardia, unspecified: Secondary | ICD-10-CM | POA: Insufficient documentation

## 2019-12-24 DIAGNOSIS — R9431 Abnormal electrocardiogram [ECG] [EKG]: Secondary | ICD-10-CM | POA: Insufficient documentation

## 2019-12-24 NOTE — Assessment & Plan Note (Signed)
Acutely worse - situational related to recent symptoms.  Continues paxil 40mg  daily.

## 2019-12-24 NOTE — Assessment & Plan Note (Signed)
LAFB, q waves inferiorly. Patient denies anginal symptoms.  Recommend further evaluation by cardiology - he desires to focus on current symptoms of headache, arthritis, fatigue at this time.  Start toprol XL 12.5mg  daily, continue losartan 50mg  daily.  Consider starting aspirin.

## 2019-12-24 NOTE — Assessment & Plan Note (Signed)
Continues allopurinol 100mg  daily without recent gout flare

## 2019-12-24 NOTE — Assessment & Plan Note (Signed)
Sees Dr Kathlene November rheum ([facility]).  Thought overlap inflammatory arthritis and connective tissue disease. Now on MTX weeklyl along with planned slow prednisone taper.  Appreciate rheum care.

## 2019-12-24 NOTE — Assessment & Plan Note (Signed)
Pulse chronically runs high - anticipate component of deconditioning. Start Toprol XL 12.5mg  daily. Check EKG today. Encouraged good hydration, limiting caffeine.

## 2019-12-24 NOTE — Assessment & Plan Note (Signed)
Chronic, stable on losartan 50mg  daily. Add toprol XL 12.5mg  daily

## 2019-12-24 NOTE — Assessment & Plan Note (Addendum)
Chronic, persistent daily headache that started late 04/2019, significant improvement while on prednisone but planning taper through rheum.  Not consistent with migraine, cluster headache, TTH.  Reassuringly negative temporal artery biopsy 10/2019.  Will refer to neurology for further evaluation of headache

## 2019-12-24 NOTE — Assessment & Plan Note (Addendum)
Ongoing, with HA and polyarthralgias. Rheum didn't think necessarily related to connective tissue disease.  Consider sleep study given body habitus and endorsed daytime somnolence, although ESS low (5).

## 2019-12-28 ENCOUNTER — Encounter: Payer: Self-pay | Admitting: Family Medicine

## 2019-12-28 NOTE — Telephone Encounter (Signed)
Printed AVS and mailed to pt.  FYI to Dr. Darnell Level.

## 2019-12-29 DIAGNOSIS — M064 Inflammatory polyarthropathy: Secondary | ICD-10-CM | POA: Diagnosis not present

## 2020-02-14 DIAGNOSIS — U071 COVID-19: Secondary | ICD-10-CM | POA: Diagnosis not present

## 2020-02-14 DIAGNOSIS — Z20822 Contact with and (suspected) exposure to covid-19: Secondary | ICD-10-CM | POA: Diagnosis not present

## 2020-03-01 ENCOUNTER — Encounter: Payer: Self-pay | Admitting: Neurology

## 2020-03-01 ENCOUNTER — Ambulatory Visit: Payer: BC Managed Care – PPO | Admitting: Neurology

## 2020-03-01 VITALS — BP 128/84 | HR 104 | Ht 70.0 in | Wt 266.0 lb

## 2020-03-01 DIAGNOSIS — R0683 Snoring: Secondary | ICD-10-CM | POA: Diagnosis not present

## 2020-03-01 DIAGNOSIS — R5383 Other fatigue: Secondary | ICD-10-CM

## 2020-03-01 DIAGNOSIS — J449 Chronic obstructive pulmonary disease, unspecified: Secondary | ICD-10-CM

## 2020-03-01 DIAGNOSIS — F17200 Nicotine dependence, unspecified, uncomplicated: Secondary | ICD-10-CM

## 2020-03-01 DIAGNOSIS — R351 Nocturia: Secondary | ICD-10-CM

## 2020-03-01 DIAGNOSIS — E669 Obesity, unspecified: Secondary | ICD-10-CM | POA: Diagnosis not present

## 2020-03-01 DIAGNOSIS — R635 Abnormal weight gain: Secondary | ICD-10-CM | POA: Diagnosis not present

## 2020-03-01 DIAGNOSIS — G478 Other sleep disorders: Secondary | ICD-10-CM

## 2020-03-01 DIAGNOSIS — R519 Headache, unspecified: Secondary | ICD-10-CM

## 2020-03-01 NOTE — Progress Notes (Signed)
Subjective:    Patient ID: Daniel May is a 55 y.o. male.  HPI     Daniel May, Daniel May, Daniel May [facility] [address] [place], [place] [address]  Dear Dr. Danise Mina,  I saw your patient, Daniel May, upon your kind request in my sleep clinic today for initial consultation of his sleep disorder, in particular, concern for underlying obstructive sleep apnea.  The patient is unaccompanied today.  As you know, Daniel May is a 55 year old right-handed gentleman with an underlying medical history of allergies, anxiety, COPD, hypertension, hyperlipidemia, history of alcohol use disorder (by chart review and self report), smoking, arthritis, recurrent headaches, and obesity, who reports snoring and excessive daytime somnolence.  I reviewed your office note from 12/23/2019.  His Epworth sleepiness score is 11 out of 24, fatigue severity score is 63 out of 63.  For the past year he has suffered from lack of energy.  He has gained quite a bit of weight in the realm of 70 pounds he estimates.  His snoring is bothersome to his wife and she has noted pauses in his breathing while he is asleep.  Bedtime is generally around 10 and rise time around 5.  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  He has nocturia about once or twice per average night.  He wakes up with a headache frequently.  This has been ongoing for the past nearly 10 months.  He is followed by rheumatology for arthritis.  He had a temporal artery biopsy which was negative for giant cell arthritis.  He has been on prednisone.  He also takes methotrexate.  He lives with his wife, they have a 72 year old daughter.  He has 3 dogs and 2 cats in the household, a TV in the bedroom which tends to stay on at night but has a sleep timer as well.  He smokes about a pack per day.  He quit drinking alcohol some 3 years ago.  He drinks caffeine in the form of coffee, 2 to 3 cups/day on average.  He does not wake  up fully rested.  His Past Medical History Is Significant For: Past Medical History:  Diagnosis Date  . Allergy   . Anxiety   . Closed fracture of tibia and fibula 1980's   right  . COPD (chronic obstructive pulmonary disease) ([place])   . Depression   . H/O seasonal allergies   . History of alcohol abuse    Quit drinking 09/2015 (used antabuse), relapsed [facility] 09/2016, sober since  . History of chicken pox   . HLD (hyperlipidemia)   . HTN (hypertension)   . Smoker   . Substance abuse ([place])    alcohol    His Past Surgical History Is Significant For: Past Surgical History:  Procedure Laterality Date  . ARTERY BIOPSY Right 10/26/2019   Procedure: BIOPSY TEMPORAL ARTERY;  Surgeon: Leta Baptist, Daniel May;  Location: [facility];  Service: ENT;  Laterality: Right;  . COLONOSCOPY  05/2017   3 TA, mild diverticulosis, int hem, rpt 5 yrs [facility])  . Fracture repair of right leg fracture  1987    His Family History Is Significant For: Family History  Problem Relation May of Onset  . Cancer Father 9       lung (smoker)  . Diabetes Brother   . Diabetes Paternal Aunt   . Heart disease Mother        CHF  .  Diabetes Sister   . Stroke Neg Hx   . CAD Neg Hx   . Colon cancer Neg Hx     His Social History Is Significant For: Social History   Socioeconomic History  . Marital status: Married    Spouse name: Not on file  . Number of children: Not on file  . Years of education: Not on file  . Highest education level: Not on file  Occupational History  . Not on file  Tobacco Use  . Smoking status: Current Every Day Smoker    Packs/day: 1.00    Types: Cigarettes  . Smokeless tobacco: Never Used  Substance and Sexual Activity  . Alcohol use: No    Comment: hx ETOH  . Drug use: No  . Sexual activity: Not on file  Other Topics Concern  . Not on file  Social History Narrative   Lives with wife, 1 dog and 3 cats   Occ: Works as Personnel officer   Edu: HS   Activity: walks dog   Diet: good water, fruits/vegetables daily   Social Determinants of Radio broadcast assistant Strain: Not on file  Food Insecurity: Not on file  Transportation Needs: Not on file  Physical Activity: Not on file  Stress: Not on file  Social Connections: Not on file    His Allergies Are:  No Known Allergies:   His Current Medications Are:  Outpatient Encounter Medications as of 03/01/2020  Medication Sig  . albuterol (VENTOLIN HFA) 108 (90 Base) MCG/ACT inhaler INHALE 2 PUFFS INTO THE LUNGS EVERY 6 HOURS AS NEEDED FOR WHEEZING OR SHORTNESS OF BREATH  . allopurinol (ZYLOPRIM) 100 MG tablet Take 1 tablet (100 mg total) by mouth daily.  . folic acid (FOLVITE) 1 MG tablet Take 1 mg by mouth daily.  Marland Kitchen losartan (COZAAR) 50 MG tablet Take 1 tablet (50 mg total) by mouth daily.  . methotrexate 2.5 MG tablet Take 10 mg by mouth once a week.  . metoprolol succinate (TOPROL-XL) 25 MG 24 hr tablet Take 0.5 tablets (12.5 mg total) by mouth daily.  . Multiple Vitamins-Minerals (MULTIVITAMIN ADULT PO) Take by mouth.  . Omega-3 Fatty Acids (FISH OIL) 1200 MG CAPS Take 2 capsules by mouth daily.  Marland Kitchen PARoxetine (PAXIL) 40 MG tablet Take 1 tablet (40 mg total) by mouth daily.  . predniSONE (DELTASONE) 10 MG tablet Take 20 mg by mouth daily with breakfast.   . vitamin C (ASCORBIC ACID) 500 MG tablet Take 500 mg by mouth daily.   No facility-administered encounter medications on file as of 03/01/2020.  :  Review of Systems:  Out of a complete 14 point review of systems, all are reviewed and negative with the exception of these symptoms as listed below:  Review of Systems  Neurological:       Here for sleep consult. No prior sleep study. Reports he does snore at night.  Epworth Sleepiness Scale 0= would never doze 1= slight chance of dozing 2= moderate chance of dozing 3= high chance of dozing  Sitting and reading:0 Watching TV:2 Sitting  inactive in a public place (ex. Theater or meeting):0 As a passenger in a car for an hour without a break:1 Lying down to rest in the afternoon:3 Sitting and talking to someone:0 Sitting quietly after lunch (no alcohol):1 In a car, while stopped in traffic:0 Total:7     Objective:  Neurological Exam  Physical Exam Physical Examination:   Vitals:   03/01/20  0906  BP: 128/84  Pulse: (!) 104  SpO2: 95%    General Examination: The patient is a very pleasant 55 y.o. male in no acute distress. He appears well-developed and well-nourished and well groomed.   HEENT: Normocephalic, atraumatic, pupils are equal, round and reactive to light, extraocular tracking is good without limitation to gaze excursion or nystagmus noted. Hearing is grossly intact. Face is symmetric with normal facial animation. Speech is clear with no dysarthria noted. There is no hypophonia. There is no lip, neck/head, jaw or voice tremor. Neck is supple with full range of passive and active motion. There are no carotid bruits on auscultation. Oropharynx exam reveals: mild mouth dryness, adequate dental hygiene and moderate airway crowding, due to small airway entry and redundant soft palate.  Tonsils small or ? absent. Mallampati is class III. Tongue protrudes centrally and palate elevates symmetrically.  Neck circumference is 20 three-quarter inches.  He has a minimal overbite.    Chest: Clear to auscultation without wheezing, rhonchi or crackles noted.  Heart: S1+S2+0, regular and normal without murmurs, rubs or gallops noted.   Abdomen: Soft, non-tender and non-distended with normal bowel sounds appreciated on auscultation.  Extremities: There is no pitting edema in the distal lower extremities bilaterally.   Skin: Warm and dry without trophic changes noted.   Musculoskeletal: exam reveals no obvious joint deformities, tenderness or joint swelling or erythema.   Neurologically:  Mental status: The patient is  awake, alert and oriented in all 4 spheres. His immediate and remote memory, attention, language skills and fund of knowledge are appropriate. There is no evidence of aphasia, agnosia, apraxia or anomia. Speech is clear with normal prosody and enunciation. Thought process is linear. Mood is normal and affect is normal.  Cranial nerves II - XII are as described above under HEENT exam.  Motor exam: Normal bulk, strength and tone is noted. There is no tremor, Romberg is negative. Fine motor skills and coordination: grossly intact.  Cerebellar testing: No dysmetria or intention tremor. There is no truncal or gait ataxia.  Sensory exam: intact to light touch in the upper and lower extremities.  Gait, station and balance: He stands easily. No veering to one side is noted. No leaning to one side is noted. Posture is May-appropriate and stance is narrow based. Gait shows normal stride length and normal pace. No problems turning are noted. Tandem walk is somewhat challenging for him.    Assessment and Plan:   In summary, Daniel May is a very pleasant 55 y.o.-year old male with an underlying medical history of allergies, anxiety, COPD, hypertension, hyperlipidemia, history of alcohol use disorder (by chart review and self report), smoking, arthritis, recurrent headaches, and obesity, whose history and physical exam are concerning for obstructive sleep apnea (OSA). I had a long chat with the patient about my findings and the diagnosis of OSA, its prognosis and treatment options. We talked about medical treatments, surgical interventions and non-pharmacological approaches. I explained in particular the risks and ramifications of untreated moderate to severe OSA, especially with respect to developing cardiovascular disease down the Road, including congestive heart failure, difficult to treat hypertension, cardiac arrhythmias, or stroke. Even type 2 diabetes has, in part, been linked to untreated OSA. Symptoms of  untreated OSA include daytime sleepiness, memory problems, mood irritability and mood disorder such as depression and anxiety, lack of energy, as well as recurrent headaches, especially morning headaches. We talked about smoking cessation and trying to maintain a healthy lifestyle in general,  as well as the importance of weight control. We also talked about the importance of good sleep hygiene. I recommended the following at this time: sleep study.  I explained the sleep test procedure to the patient and also outlined possible surgical and non-surgical treatment options of OSA, including the use of a custom-made dental device (which would require a referral to a specialist dentist or oral surgeon), upper airway surgical options, such as traditional UPPP or a novel less invasive surgical option in the form of Inspire hypoglossal nerve stimulation (which would involve a referral to an ENT surgeon). I also explained the CPAP treatment option to the patient, who indicated that he would be willing to try CPAP if the need arises. I explained the importance of being compliant with PAP treatment, not only for insurance purposes but primarily to improve His symptoms, and for the patient's long term health benefit, including to reduce His cardiovascular risks. I answered all his questions today and the patient was in agreement. I plan to see him back after the sleep study is completed and encouraged him to call with any interim questions, concerns, problems or updates.   Thank you very much for allowing me to participate in the care of this nice patient. If I can be of any further assistance to you please do not hesitate to call me at [phone].  Sincerely,   Daniel May, Daniel May, Daniel May

## 2020-03-01 NOTE — Patient Instructions (Signed)

## 2020-03-02 ENCOUNTER — Telehealth: Payer: Self-pay

## 2020-03-02 NOTE — Telephone Encounter (Signed)
LVM for pt to call me back to schedule sleep study  

## 2020-03-10 ENCOUNTER — Telehealth: Payer: Self-pay | Admitting: Family Medicine

## 2020-03-10 ENCOUNTER — Telehealth: Payer: Self-pay

## 2020-03-10 NOTE — Telephone Encounter (Signed)
Error

## 2020-03-10 NOTE — Telephone Encounter (Signed)
Call from pt stating he tested positive COVID on 02/14/20.  Sxs started on 02/11/20 and consisted of HA, nasal congestion, cough, chest congestion and SOB.  Now also has postnasal drainage.  Pt has virtual visit on 03/15/20 but requests a work note to out from today until that visit.  Can send note via MyChart.

## 2020-03-11 NOTE — Telephone Encounter (Signed)
Would only offer letter out of work from 1/20 through 1/30 in accordance with CDC guidelines that recommend 10 days out of work. That is all I can write.

## 2020-03-11 NOTE — Telephone Encounter (Signed)
Pt returned phone call and let know what Dr. Darnell Level stated and he said okay

## 2020-03-11 NOTE — Telephone Encounter (Addendum)
Left message on vm per dpr relaying Dr. G's message.  

## 2020-03-15 ENCOUNTER — Other Ambulatory Visit: Payer: Self-pay | Admitting: Family Medicine

## 2020-03-15 ENCOUNTER — Encounter: Payer: Self-pay | Admitting: Family Medicine

## 2020-03-15 ENCOUNTER — Telehealth (INDEPENDENT_AMBULATORY_CARE_PROVIDER_SITE_OTHER): Payer: BC Managed Care – PPO | Admitting: Family Medicine

## 2020-03-15 ENCOUNTER — Telehealth: Payer: Self-pay

## 2020-03-15 VITALS — Temp 99.1°F | Ht 70.0 in | Wt 265.0 lb

## 2020-03-15 DIAGNOSIS — R5382 Chronic fatigue, unspecified: Secondary | ICD-10-CM | POA: Diagnosis not present

## 2020-03-15 DIAGNOSIS — R0609 Other forms of dyspnea: Secondary | ICD-10-CM

## 2020-03-15 DIAGNOSIS — M1990 Unspecified osteoarthritis, unspecified site: Secondary | ICD-10-CM

## 2020-03-15 DIAGNOSIS — R0600 Dyspnea, unspecified: Secondary | ICD-10-CM

## 2020-03-15 DIAGNOSIS — Z79631 Long term (current) use of antimetabolite agent: Secondary | ICD-10-CM | POA: Insufficient documentation

## 2020-03-15 DIAGNOSIS — U071 COVID-19: Secondary | ICD-10-CM | POA: Insufficient documentation

## 2020-03-15 DIAGNOSIS — Z79899 Other long term (current) drug therapy: Secondary | ICD-10-CM

## 2020-03-15 DIAGNOSIS — R519 Headache, unspecified: Secondary | ICD-10-CM | POA: Diagnosis not present

## 2020-03-15 DIAGNOSIS — F17200 Nicotine dependence, unspecified, uncomplicated: Secondary | ICD-10-CM

## 2020-03-15 MED ORDER — FLUTICASONE PROPIONATE 50 MCG/ACT NA SUSP: 2.0000 | Freq: Every day | NASAL | 1 refills | Status: DC

## 2020-03-15 MED ORDER — AMOXICILLIN-POT CLAVULANATE 875-125 MG PO TABS: 1.0000 | ORAL_TABLET | Freq: Two times a day (BID) | ORAL | 0 refills | Status: AC

## 2020-03-15 NOTE — Assessment & Plan Note (Signed)
rec in-person office visit for further evaluation if ongoing symptom after antibiotic/flonase course.

## 2020-03-15 NOTE — Progress Notes (Signed)
Patient ID: Daniel May, male    DOB: July 23, 1965, 55 y.o.   MRN: [id number]  Virtual visit completed through [place], a video enabled telemedicine application. Due to national recommendations of social distancing due to COVID-19, a virtual visit is felt to be most appropriate for this patient at this time. Reviewed limitations, risks, security and privacy concerns of performing a virtual visit and the availability of in person appointments. I also reviewed that there may be a patient responsible charge related to this service. The patient agreed to proceed.   Patient location: home Provider location: [place] [facility], office Persons participating in this virtual visit: patient, provider   If any vitals were documented, they were collected by patient at home unless specified below.    Temp 99.1 F (37.3 C)   Ht 5\' 10"  (1.778 m)   Wt 265 lb (120.2 kg)   BMI 38.02 kg/m    CC: ongoing symptoms post covid  Subjective:   HPI: Daniel May is a 55 y.o. male presenting on 03/15/2020 for Headache (C/o HA, head/chest congestion since COVID dx on 02/14/20.  Feels like nasal passages are swollen closed. Tried Mucinex, barely helpful. )   Suffered COVID infection late 01/2020, positive COVID test 02/14/2020. was evaluated at [facility] in [place] - no treatment prescribed besides mucinex. Symptoms included fever, chills, dyspnea and cough. Initial loss of taste and smell. First contacted our office on 03/10/2020. Out of work since 02/14/2020. Can't get rid of congestion - has been taking mucinex with benefit. Notes ongoing pressure to maxillary sinuses. No more fevers. Unable to get anything out of nose. Notes ongoing dyspnea with exertion. Ongoing intermittent nausea and headache. Fatigue is unchanged with COVID.   Saw Dr Rexene Alberts 2/8 for ongoing fatigue - rec sleep study to evaluate for OSA. Needs to get sleep study scheduled.   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 Continues 10mg  prednisone. Was out of work May through December - then returned part time.   Last visit 12/2019 we started toprol XL 12.5mg  daily in addition to his losartan 50mg  daily - he's tolerated this well.   Continued 1 ppd smoker - less with recent illness.   Has been out of work since 01/2020, returned to work a few days early January then again out of work due to self-quarantining due to exposure to other sick family members.      Relevant past medical, surgical, family and social history reviewed and updated as indicated. Interim medical history since our last visit reviewed. Allergies and medications reviewed and updated. Outpatient Medications Prior to Visit  Medication Sig Dispense Refill  . albuterol (VENTOLIN HFA) 108 (90 Base) MCG/ACT inhaler INHALE 2 PUFFS INTO THE LUNGS EVERY 6 HOURS AS NEEDED FOR WHEEZING OR SHORTNESS OF BREATH 54 g 0  . allopurinol (ZYLOPRIM) 100 MG tablet Take 1 tablet (100 mg total) by mouth daily. 90 tablet 3  . folic acid (FOLVITE) 1 MG tablet Take 1 mg by mouth daily.    Marland Kitchen losartan (COZAAR) 50 MG tablet Take 1 tablet (50 mg total) by mouth daily. 90 tablet 3  . methotrexate 2.5 MG tablet Take 10 mg by mouth once a week.    . metoprolol succinate (TOPROL-XL) 25 MG 24 hr tablet Take 0.5 tablets (12.5 mg total) by mouth daily. 45 tablet 1  . Multiple Vitamins-Minerals (MULTIVITAMIN ADULT PO) Take by mouth.    Marland Kitchen  Omega-3 Fatty Acids (FISH OIL) 1200 MG CAPS Take 2 capsules by mouth daily.    Marland Kitchen PARoxetine (PAXIL) 40 MG tablet Take 1 tablet (40 mg total) by mouth daily. 90 tablet 3  . predniSONE (DELTASONE) 10 MG tablet Take 20 mg by mouth daily with breakfast.     . vitamin C (ASCORBIC ACID) 500 MG tablet Take 500 mg by mouth daily.     No facility-administered medications prior to visit.     Per HPI unless specifically indicated in ROS section below Review of Systems Objective:  Temp 99.1 F (37.3 C)    Ht 5\' 10"  (1.778 m)   Wt 265 lb (120.2 kg)   BMI 38.02 kg/m   Wt Readings from Last 3 Encounters:  03/15/20 265 lb (120.2 kg)  03/01/20 266 lb (120.7 kg)  12/23/19 269 lb 5 oz (122.2 kg)       Physical exam: Gen: alert, NAD, not ill appearing Pulm: speaks in complete sentences without increased work of breathing Psych: normal mood, normal thought content      Assessment & Plan:   Problem List Items Addressed This Visit    Smoker    Continue to encourage cessation. Has decreased smoking in setting of respiratory illness.       Nonintractable episodic headache    Needs to schedule sleep study through neurology.       Methotrexate, long term, current use   Inflammatory arthritis    Appreciate rheum care.  Continue MTX + folate.       Exertional dyspnea    rec in-person office visit for further evaluation if ongoing symptom after antibiotic/flonase course.       COVID-19 virus infection - Primary    Suffered COVID19 infection 01/2020 (tested positive at local [facility]), symptoms have improved but notes persistent head congestion and easy exertional dyspnea.  Will Rx augmentin + flonase course to treat possible bacterial sinusitis component to ongoing head congestion.  If no better after treatment, did recommend OV for further evaluation of exertional dyspnea post covid.  Will write letter for work but did discuss need to keep me updated with symptoms in real-time and not 1 month after the fact.  Chronic fatigue unchanged (present even prior - see above).       Chronic fatigue    Appreciate neuro care - will call back today to schedule sleep eval. OSA could contribute to fatigue/headaches.           Meds ordered this encounter  Medications  . amoxicillin-clavulanate (AUGMENTIN) 875-125 MG tablet    Sig: Take 1 tablet by mouth 2 (two) times daily for 10 days.    Dispense:  20 tablet    Refill:  0  . fluticasone (FLONASE) 50 MCG/ACT nasal spray    Sig: Place 2 sprays  into both nostrils daily.    Dispense:  16 g    Refill:  1   No orders of the defined types were placed in this encounter.   I discussed the assessment and treatment plan with the patient. The patient was provided an opportunity to ask questions and all were answered. The patient agreed with the plan and demonstrated an understanding of the instructions. The patient was advised to call back or seek an in-person evaluation if the symptoms worsen or if the condition fails to improve as anticipated.  Follow up plan: Return if symptoms worsen or fail to improve.  Ria Bush, MD

## 2020-03-15 NOTE — Assessment & Plan Note (Addendum)
Appreciate rheum care.  Continue MTX + folate.

## 2020-03-15 NOTE — Telephone Encounter (Signed)
Message from pharmacy stating pt requests 90-day supply.  Ok to send?

## 2020-03-15 NOTE — Assessment & Plan Note (Signed)
Suffered COVID19 infection 01/2020 (tested positive at local [facility]), symptoms have improved but notes persistent head congestion and easy exertional dyspnea.  Will Rx augmentin + flonase course to treat possible bacterial sinusitis component to ongoing head congestion.  If no better after treatment, did recommend OV for further evaluation of exertional dyspnea post covid.  Will write letter for work but did discuss need to keep me updated with symptoms in real-time and not 1 month after the fact.  Chronic fatigue unchanged (present even prior - see above).

## 2020-03-15 NOTE — Telephone Encounter (Signed)
LVM for pt to call me back to schedule sleep study  

## 2020-03-15 NOTE — Assessment & Plan Note (Signed)
Needs to schedule sleep study through neurology.

## 2020-03-15 NOTE — Assessment & Plan Note (Signed)
Appreciate neuro care - will call back today to schedule sleep eval. OSA could contribute to fatigue/headaches.

## 2020-03-15 NOTE — Assessment & Plan Note (Signed)
Continue to encourage cessation. Has decreased smoking in setting of respiratory illness.

## 2020-03-17 NOTE — Telephone Encounter (Signed)
ERx 

## 2020-04-11 ENCOUNTER — Telehealth: Payer: Self-pay

## 2020-04-11 NOTE — Telephone Encounter (Signed)
LVM to call clinic in regards to short term disability paperwork

## 2020-04-14 NOTE — Telephone Encounter (Signed)
Pt returned phone call   Starting short term disability paperwork now

## 2020-04-14 NOTE — Telephone Encounter (Signed)
Short term disability paperwork is completed and awaiting signature  Placed in PCP's inbox

## 2020-04-14 NOTE — Telephone Encounter (Signed)
LVM to inform pt I need to speak with him about short term disability paperwork.  Left my direct number and asked to return my call

## 2020-04-21 DIAGNOSIS — Z0279 Encounter for issue of other medical certificate: Secondary | ICD-10-CM

## 2020-04-22 NOTE — Telephone Encounter (Signed)
Left pt a VM that STD paperwork is signed, completed and faxed  Copy for billing  Copy for scan  Copy for pt sent via mail  Copy retained by me

## 2020-04-22 NOTE — Telephone Encounter (Addendum)
Filled and in my outbox

## 2020-04-25 ENCOUNTER — Encounter: Payer: Self-pay | Admitting: Family Medicine

## 2020-04-25 ENCOUNTER — Other Ambulatory Visit: Payer: Self-pay

## 2020-04-25 ENCOUNTER — Telehealth (INDEPENDENT_AMBULATORY_CARE_PROVIDER_SITE_OTHER): Payer: BC Managed Care – PPO | Admitting: Family Medicine

## 2020-04-25 VITALS — BP 130/85 | Temp 97.8°F | Ht 70.0 in | Wt 260.0 lb

## 2020-04-25 DIAGNOSIS — I10 Essential (primary) hypertension: Secondary | ICD-10-CM | POA: Diagnosis not present

## 2020-04-25 DIAGNOSIS — R5382 Chronic fatigue, unspecified: Secondary | ICD-10-CM

## 2020-04-25 DIAGNOSIS — M1990 Unspecified osteoarthritis, unspecified site: Secondary | ICD-10-CM

## 2020-04-25 DIAGNOSIS — F3289 Other specified depressive episodes: Secondary | ICD-10-CM

## 2020-04-25 DIAGNOSIS — R519 Headache, unspecified: Secondary | ICD-10-CM | POA: Diagnosis not present

## 2020-04-25 MED ORDER — BUPROPION HCL ER (SR) 100 MG PO TB12: 100.0000 mg | ORAL_TABLET | Freq: Every day | ORAL | 3 refills | Status: DC

## 2020-04-25 NOTE — Assessment & Plan Note (Addendum)
Continue paxil 40mg , add wellbutrin SR 100mg  daily. Hopeful this will help energy levels. Update with effect.

## 2020-04-25 NOTE — Assessment & Plan Note (Addendum)
Ongoing. Still pending sleep study - states he's planning to schedule for next week, has been unable to schedule due to insurance coverage issues.  Will write return to work letter and send via [employer].

## 2020-04-25 NOTE — Assessment & Plan Note (Signed)
Actually some better after abx treatment for presumed sinusitis after COVID infection

## 2020-04-25 NOTE — Assessment & Plan Note (Signed)
Chronic, stable on losartan + Toprol XL

## 2020-04-25 NOTE — Assessment & Plan Note (Addendum)
Appreciate rheum care, continues MTX and low dose daily prednisone He will call to schedule f/u

## 2020-04-25 NOTE — Progress Notes (Signed)
Patient ID: Daniel May, male    DOB: Oct 08, 1965, 55 y.o.   MRN: [id number]  Virtual visit completed through [place], a video enabled telemedicine application. Due to national recommendations of social distancing due to COVID-19, a virtual visit is felt to be most appropriate for this patient at this time. Reviewed limitations, risks, security and privacy concerns of performing a virtual visit and the availability of in person appointments. I also reviewed that there may be a patient responsible charge related to this service. The patient agreed to proceed.   Patient location: home Provider location: [place] at [facility], office Persons participating in this virtual visit: patient, provider   If any vitals were documented, they were collected by patient at home unless specified below.    BP 130/85 Comment: checked 2 wks ago  Temp 97.8 F (36.6 C)   Ht 5\' 10"  (1.778 m)   Wt 260 lb (117.9 kg)   BMI 37.31 kg/m    CC: post-COVID symptoms  Subjective:   HPI: Daniel May is a 54 y.o. male presenting on 04/25/2020 for Work Note (Planning to return to work 04/29/20.)   See prior note for details. COVID illness late 01/2020, seen virtually 03/15/2020. Ongoing symptoms of congestion, maxillary sinus pressure, dyspnea with exertion, nausea and headache (chronic headache and fatigue seeing neurology and rheumatology).   Saw neurology Dr Rexene Alberts 03/01/20 - recommended sleep study to evaluate for OSA. Planning to schedule this next week - has been having trouble with insurance company.   Saw rheum Dr Kathlene November for inflammatory arthritis overlapping with CTD on MTX 10mg  weekly + folic acid, as well as prednisone 10mg  daily. Planning to call to schedule f/u.   Predominant ongoing symptom is fatigue.  Last visit treated with augmentin + flonase - this significantly helped.  Congestion significantly better - noticing regular allergic rhinitis.  Has restarted low carb diet. 5 lb weight loss since last visit.   States he's tolerating current antihypertensive regimen well with good BP control.   Notes ongoing fatigue affecting mood - feeling down/depressed. Requests additional med or change in regimen.   Wants to return to work this week, Friday is next shift - will write letter for him.      Relevant past medical, surgical, family and social history reviewed and updated as indicated. Interim medical history since our last visit reviewed. Allergies and medications reviewed and updated. Outpatient Medications Prior to Visit  Medication Sig Dispense Refill  . albuterol (VENTOLIN HFA) 108 (90 Base) MCG/ACT inhaler INHALE 2 PUFFS INTO THE LUNGS EVERY 6 HOURS AS NEEDED FOR WHEEZING OR SHORTNESS OF BREATH 54 g 0  . allopurinol (ZYLOPRIM) 100 MG tablet Take 1 tablet (100 mg total) by mouth daily. 90 tablet 3  . fluticasone (FLONASE) 50 MCG/ACT nasal spray SHAKE LIQUID AND USE 2 SPRAYS IN EACH NOSTRIL DAILY 48 g 1  . folic acid (FOLVITE) 1 MG tablet Take 1 mg by mouth daily.    Marland Kitchen losartan (COZAAR) 50 MG tablet Take 1 tablet (50 mg total) by mouth daily. 90 tablet 3  . methotrexate 2.5 MG tablet Take 10 mg by mouth once a week.    . metoprolol succinate (TOPROL-XL) 25 MG 24 hr tablet Take 0.5 tablets (12.5 mg total) by mouth daily. 45 tablet 1  . Multiple Vitamins-Minerals (MULTIVITAMIN ADULT PO) Take by mouth.    . Omega-3 Fatty Acids (FISH OIL) 1200 MG CAPS Take 2 capsules by mouth daily.    Marland Kitchen PARoxetine (PAXIL) 40  MG tablet Take 1 tablet (40 mg total) by mouth daily. 90 tablet 3  . vitamin C (ASCORBIC ACID) 500 MG tablet Take 500 mg by mouth daily.    . predniSONE (DELTASONE) 10 MG tablet Take 20 mg by mouth daily with breakfast.      No facility-administered medications prior to visit.     Per HPI unless specifically indicated in ROS section below Review of Systems Objective:  BP 130/85 Comment: checked 2 wks ago  Temp 97.8 F (36.6 C)   Ht 5\' 10"  (1.778 m)   Wt 260 lb (117.9 kg)   BMI 37.31  kg/m   Wt Readings from Last 3 Encounters:  04/25/20 260 lb (117.9 kg)  03/15/20 265 lb (120.2 kg)  03/01/20 266 lb (120.7 kg)       Physical exam: Gen: alert, NAD, not ill appearing Pulm: speaks in complete sentences without increased work of breathing Psych: normal mood, normal thought content      Results for orders placed or performed during the hospital encounter of 10/26/19  Surgical pathology  Result Value Ref Range   SURGICAL PATHOLOGY      SURGICAL PATHOLOGY CASE: [id number] PATIENT: [name] Surgical Pathology Report     Clinical History: headache with orthostatic component (cm)   FINAL MICROSCOPIC DIAGNOSIS:  A. TEMPORAL ARTERY, RIGHT, BIOPSY: -  Benign vascular wall without evidence of inflammation   GROSS DESCRIPTION:  The specimen is received in formalin and consists of a tubular segment of tan-pink soft tissue, measuring 0.6 cm in length by 0.2 cm in diameter.  Specimen is bisected and entirely submitted in 1 cassette. Craig Staggers 07/26/2019)    Final Diagnosis performed by Thressa Sheller, MD.   Electronically signed 10/27/2019 Technical and / or Professional components performed at [facility]. [facility], [place] [address] [place], [place] [address].  Immunohistochemistry Technical component (if applicable) was performed at [facility]. [address], [place], [place], [place] [address].   IMMUNOHISTOCHEMISTRY DISCLAIMER (if applicable): Some of these immunohistoche mical stains may have been developed and the performance characteristics determine by [facility]. Some may not have been cleared or approved by the U.S. Food and Drug Administration. The FDA has determined that such clearance or approval is not necessary. This test is used for clinical purposes. It should not be regarded as investigational or for research. This laboratory is certified under the [place] (CLIA-88) as qualified to perform high complexity clinical laboratory testing.  The controls stained appropriately.    Assessment & Plan:   Problem List Items Addressed This Visit    Depression    Continue paxil 40mg , add wellbutrin SR 100mg  daily. Hopeful this will help energy levels. Update with effect.       Relevant Medications   buPROPion (WELLBUTRIN SR) 100 MG 12 hr tablet   HTN (hypertension)    Chronic, stable on losartan + Toprol XL      Chronic fatigue - Primary    Ongoing. Still pending sleep study - states he's planning to schedule for next week, has been unable to schedule due to insurance coverage issues.  Will write return to work letter and send via [employer].       Nonintractable episodic headache    Actually some better after abx treatment for presumed sinusitis after COVID infection       Relevant Medications   buPROPion (WELLBUTRIN SR) 100 MG 12 hr tablet   Inflammatory arthritis  Appreciate rheum care, continues MTX and low dose daily prednisone He will call to schedule f/u          Meds ordered this encounter  Medications  . buPROPion (WELLBUTRIN SR) 100 MG 12 hr tablet    Sig: Take 1 tablet (100 mg total) by mouth daily.    Dispense:  30 tablet    Refill:  3    To take with paxil   No orders of the defined types were placed in this encounter.   I discussed the assessment and treatment plan with the patient. The patient was provided an opportunity to ask questions and all were answered. The patient agreed with the plan and demonstrated an understanding of the instructions. The patient was advised to call back or seek an in-person evaluation if the symptoms worsen or if the condition fails to improve as anticipated.  Follow up plan: Return if symptoms worsen or fail to improve.  Ria Bush, MD

## 2020-04-26 ENCOUNTER — Telehealth: Payer: Self-pay | Admitting: Family Medicine

## 2020-04-26 NOTE — Telephone Encounter (Signed)
E-scribed refill.  Plz schedule lab and cpe visits.  

## 2020-04-26 NOTE — Telephone Encounter (Signed)
Left detailed message per DPR to schedule cpe and labs. EM

## 2020-04-27 ENCOUNTER — Encounter: Payer: Self-pay | Admitting: Family Medicine

## 2020-04-27 NOTE — Telephone Encounter (Signed)
Called and left vm (detailed per DPR) to call and schedule cpe and labs twice. Will mail letter to patient as last attempt to contact patient EM

## 2020-04-27 NOTE — Telephone Encounter (Signed)
Noted  

## 2020-05-05 NOTE — Telephone Encounter (Signed)
LVM that return to work paperwork was faxed  Copy mailed to pt  Copy for scan   Copy retained by me

## 2020-05-21 ENCOUNTER — Other Ambulatory Visit: Payer: Self-pay | Admitting: Family Medicine

## 2020-07-10 ENCOUNTER — Other Ambulatory Visit: Payer: Self-pay | Admitting: Family Medicine

## 2020-07-11 NOTE — Telephone Encounter (Signed)
E-scribed refill.  Pt needs annual physical.

## 2020-09-17 ENCOUNTER — Other Ambulatory Visit: Payer: Self-pay | Admitting: Family Medicine

## 2020-09-20 NOTE — Telephone Encounter (Signed)
Called pt to schedule appt. Pt did not answer so I left a VM.

## 2020-09-20 NOTE — Telephone Encounter (Signed)
E-scribed refill.  Plz schedule lab and cpe visits to prevent delay in future refills.

## 2020-10-07 DIAGNOSIS — Z20822 Contact with and (suspected) exposure to covid-19: Secondary | ICD-10-CM | POA: Diagnosis not present

## 2020-10-07 DIAGNOSIS — B349 Viral infection, unspecified: Secondary | ICD-10-CM | POA: Diagnosis not present

## 2020-10-17 ENCOUNTER — Encounter: Payer: Self-pay | Admitting: Family Medicine

## 2020-10-17 NOTE — Telephone Encounter (Addendum)
I was  unable to speak with pt or his wife; left v/m requesting cb to change appt on 10/18/20 at 10 AM to video visit and to get more info on pts symptoms; no answer at any contact # for pt or his wife (DPR signed)/ sending note to Dr Darnell Level and Lattie Haw CMA. Will also teams LIsa.  I spoke with pt; pt  having symptoms for 1 1/2 wks;pt took covid test last week was neg. Now pt having head congestion; mostly dry cough except early morning has prod cough with yellow green phlegm. H/A with 5 pain level.pt does  have runny nose and last week  had diarrhea but no diarrhea now. No SOB or S/T . Pt changed appt to video visit on 10/18/20 at 10 AM. UC & ED precautions given and pt voiced understanding. Sending note to Dr Darnell Level and Lattie Haw.

## 2020-10-18 ENCOUNTER — Telehealth (INDEPENDENT_AMBULATORY_CARE_PROVIDER_SITE_OTHER): Payer: BC Managed Care – PPO | Admitting: Family Medicine

## 2020-10-18 ENCOUNTER — Other Ambulatory Visit: Payer: Self-pay

## 2020-10-18 ENCOUNTER — Encounter: Payer: Self-pay | Admitting: Family Medicine

## 2020-10-18 VITALS — Temp 100.4°F | Ht 70.0 in | Wt 230.0 lb

## 2020-10-18 DIAGNOSIS — F3289 Other specified depressive episodes: Secondary | ICD-10-CM | POA: Diagnosis not present

## 2020-10-18 DIAGNOSIS — J019 Acute sinusitis, unspecified: Secondary | ICD-10-CM

## 2020-10-18 MED ORDER — BUPROPION HCL ER (SR) 100 MG PO TB12
100.0000 mg | ORAL_TABLET | Freq: Every day | ORAL | 1 refills | Status: DC
Start: 2020-10-18 — End: 2020-12-13

## 2020-10-18 MED ORDER — ALBUTEROL SULFATE HFA 108 (90 BASE) MCG/ACT IN AERS: 2.0000 | INHALATION_SPRAY | Freq: Four times a day (QID) | RESPIRATORY_TRACT | 1 refills | Status: DC | PRN

## 2020-10-18 MED ORDER — AMOXICILLIN-POT CLAVULANATE 875-125 MG PO TABS: 1.0000 | ORAL_TABLET | Freq: Two times a day (BID) | ORAL | 0 refills | Status: AC

## 2020-10-18 MED ORDER — FLUTICASONE PROPIONATE 50 MCG/ACT NA SUSP
2.0000 | Freq: Every day | NASAL | 1 refills | Status: DC
Start: 2020-10-18 — End: 2022-11-27

## 2020-10-18 NOTE — Assessment & Plan Note (Addendum)
Stable period on paxil + wellbutrin - will continue this. Requests 90d supply of wellbutrin to mail order pharmacy

## 2020-10-18 NOTE — Assessment & Plan Note (Addendum)
Will treat with augmentin course given duration and progression of symptoms. He is also on MTX. Update if not improving with treatment. Albuterol inh refilled per pt preference. Discussed flonase use.

## 2020-10-18 NOTE — Progress Notes (Signed)
Patient ID: Daniel May, male    DOB: March 11, 1965, 55 y.o.   MRN: [id number]  Virtual visit attempted through [place], a video enabled telemedicine application. Due to national recommendations of social distancing due to COVID-19, a virtual visit is felt to be most appropriate for this patient at this time. Reviewed limitations, risks, security and privacy concerns of performing a virtual visit and the availability of in person appointments. I also reviewed that there may be a patient responsible charge related to this service. The patient agreed to proceed.   Interactive audio and video telecommunications were attempted between myself and Rakim Moone, however failed due to technical difficulties - [facility] never connected [place].  We continued and completed visit with audio only.  Time: 10:00am - 10:22am   Patient location: home Provider location: [place] at [facility], office Persons participating in this virtual visit: patient, provider   If any vitals were documented, they were collected by patient at home unless specified below.    Temp (!) 100.4 F (38 C)   Ht 5\' 10"  (1.778 m)   Wt 230 lb (104.3 kg)   BMI 33.00 kg/m    CC: cough, HA and head congestion Subjective:   HPI: Daniel May is a 55 y.o. male presenting on 10/18/2020 for Cough (C/o cough, HA and head congestion.  Sxs started about 1 wk ago.  Neg COVID test on 10/12/20 at walk-in clinic.  Not had COVID vaccine. )   Was scheduled for CPE, changed to virtual visit due to acute illness.   Started feeling ill 1+ wk ago - HA, cough, head congestion, initial fever, ST, body aches, nausea.  Currently ongoing nausea, low grade fever, dry cough, significant nasal congestion, R jaw pain, and sinus pressure headache. Head > chest congestion.   No significant diarrhea, shortness of breath, wheezing.   Seen at [facility] walk in clinic earlier in the week with negative COVID PCR test, recommended supportive care.   Has been taking  tylenol and mucinex with plenty of water.  Ongoing smoker has cut down to 1/2 ppd.  Endorses 30 lb weight loss - following low carb diet and has increased activity.  Out of work since Friday 10/14/2020.   Continues methotrexate for inflammatory arthritis through rheumatology.   Doing well on wellbutrin - requests 90d supply Requests albuterol Rx sent to [facility] in [place] (cheaper)     Relevant past medical, surgical, family and social history reviewed and updated as indicated. Interim medical history since our last visit reviewed. Allergies and medications reviewed and updated. Outpatient Medications Prior to Visit  Medication Sig Dispense Refill   allopurinol (ZYLOPRIM) 100 MG tablet TAKE 1 TABLET BY MOUTH EVERY DAY 90 tablet 0   folic acid (FOLVITE) 1 MG tablet Take 1 mg by mouth daily.     losartan (COZAAR) 50 MG tablet TAKE 1 TABLET BY MOUTH EVERY DAY 90 tablet 0   methotrexate 2.5 MG tablet Take 10 mg by mouth once a week.     metoprolol succinate (TOPROL-XL) 25 MG 24 hr tablet TAKE 1/2 TABLET BY MOUTH EVERY DAY 45 tablet 0   Multiple Vitamins-Minerals (MULTIVITAMIN ADULT PO) Take by mouth.     Omega-3 Fatty Acids (FISH OIL) 1200 MG CAPS Take 2 capsules by mouth daily.     PARoxetine (PAXIL) 40 MG tablet TAKE 1 TABLET BY MOUTH EVERY DAY 90 tablet 0   vitamin C (ASCORBIC ACID) 500 MG tablet Take 500 mg by mouth daily.  albuterol (VENTOLIN HFA) 108 (90 Base) MCG/ACT inhaler INHALE 2 PUFFS INTO THE LUNGS EVERY 6 HOURS AS NEEDED FOR WHEEZING OR SHORTNESS OF BREATH 54 g 0   buPROPion ER (WELLBUTRIN SR) 100 MG 12 hr tablet TAKE 1 TABLET(100 MG) BY MOUTH DAILY 30 tablet 0   fluticasone (FLONASE) 50 MCG/ACT nasal spray SHAKE LIQUID AND USE 2 SPRAYS IN EACH NOSTRIL DAILY 48 g 1   No facility-administered medications prior to visit.     Per HPI unless specifically indicated in ROS section below Review of Systems Objective:  Temp (!) 100.4 F (38 C)   Ht 5\' 10"  (1.778 m)   Wt 230 lb  (104.3 kg)   BMI 33.00 kg/m   Wt Readings from Last 3 Encounters:  10/18/20 230 lb (104.3 kg)  04/25/20 260 lb (117.9 kg)  03/15/20 265 lb (120.2 kg)       Physical exam: Pulm: speaks in complete sentences without increased work of breathing Psych: normal mood, normal thought content      Assessment & Plan:   Problem List Items Addressed This Visit     Depression    Stable period on paxil + wellbutrin - will continue this. Requests 90d supply of wellbutrin to mail order pharmacy      Relevant Medications   buPROPion ER (WELLBUTRIN SR) 100 MG 12 hr tablet   Acute sinusitis - Primary    Will treat with augmentin course given duration and progression of symptoms. He is also on MTX. Update if not improving with treatment. Albuterol inh refilled per pt preference. Discussed flonase use.       Relevant Medications   fluticasone (FLONASE) 50 MCG/ACT nasal spray   amoxicillin-clavulanate (AUGMENTIN) 875-125 MG tablet     Meds ordered this encounter  Medications   fluticasone (FLONASE) 50 MCG/ACT nasal spray    Sig: Place 2 sprays into both nostrils daily.    Dispense:  48 g    Refill:  1   amoxicillin-clavulanate (AUGMENTIN) 875-125 MG tablet    Sig: Take 1 tablet by mouth 2 (two) times daily for 7 days.    Dispense:  14 tablet    Refill:  0   buPROPion ER (WELLBUTRIN SR) 100 MG 12 hr tablet    Sig: Take 1 tablet (100 mg total) by mouth daily.    Dispense:  90 tablet    Refill:  1   DISCONTD: albuterol (VENTOLIN HFA) 108 (90 Base) MCG/ACT inhaler    Sig: Inhale 2 puffs into the lungs every 6 (six) hours as needed for wheezing or shortness of breath.    Dispense:  54 g    Refill:  1   albuterol (VENTOLIN HFA) 108 (90 Base) MCG/ACT inhaler    Sig: Inhale 2 puffs into the lungs every 6 (six) hours as needed for wheezing or shortness of breath.    Dispense:  54 g    Refill:  1    No orders of the defined types were placed in this encounter.   I discussed the  assessment and treatment plan with the patient. The patient was provided an opportunity to ask questions and all were answered. The patient agreed with the plan and demonstrated an understanding of the instructions. The patient was advised to call back or seek an in-person evaluation if the symptoms worsen or if the condition fails to improve as anticipated.  Follow up plan: No follow-ups on file.  Ria Bush, MD

## 2020-10-18 NOTE — Telephone Encounter (Signed)
Seeing patient today

## 2020-10-21 ENCOUNTER — Encounter: Payer: Self-pay | Admitting: Family Medicine

## 2020-11-07 ENCOUNTER — Other Ambulatory Visit: Payer: Self-pay | Admitting: Family Medicine

## 2020-11-10 ENCOUNTER — Other Ambulatory Visit: Payer: Self-pay | Admitting: Family Medicine

## 2020-11-11 ENCOUNTER — Telehealth: Payer: Self-pay | Admitting: Family Medicine

## 2020-11-13 ENCOUNTER — Encounter: Payer: Self-pay | Admitting: Family Medicine

## 2020-11-14 NOTE — Telephone Encounter (Signed)
Called and spoke to patient and advised him that the scripts were sent to [facility] 11/10/20 #90. Patient stated that he thinks his wife wants them to go to [facility] but will have her contact the pharmacy and have them transferred.

## 2020-11-14 NOTE — Telephone Encounter (Signed)
  Encourage patient to contact the pharmacy for refills or they can request refills through [place]:  Please schedule appointment if longer than 1 year  NEXT APPOINTMENT DATE:  MEDICATION:losartan (COZAAR) 50 MG tablet  PARoxetine (PAXIL) 40 MG tablet   Is the patient out of medication?   [place], [place]   Let patient know to contact pharmacy at the end of the day to make sure medication is ready.  Please notify patient to allow 48-72 hours to process  CLINICAL FILLS OUT ALL BELOW:   LAST REFILL:  QTY:  REFILL DATE:    OTHER COMMENTS:    Okay for refill?  Please advise

## 2020-11-15 ENCOUNTER — Encounter: Payer: Self-pay | Admitting: Family Medicine

## 2020-11-17 MED ORDER — METOPROLOL SUCCINATE ER 25 MG PO TB24: 12.5000 mg | ORAL_TABLET | Freq: Every day | ORAL | 0 refills | Status: DC

## 2020-11-17 NOTE — Telephone Encounter (Signed)
E-scribed metoprolol refill today to [facility].    Allopurinol refill was sent to [facility] on 11/08/20.

## 2020-11-30 ENCOUNTER — Other Ambulatory Visit: Payer: Self-pay

## 2020-11-30 ENCOUNTER — Telehealth (INDEPENDENT_AMBULATORY_CARE_PROVIDER_SITE_OTHER): Payer: BC Managed Care – PPO | Admitting: Family Medicine

## 2020-11-30 ENCOUNTER — Encounter: Payer: Self-pay | Admitting: Family Medicine

## 2020-11-30 VITALS — Temp 98.8°F | Ht 70.0 in | Wt 230.0 lb

## 2020-11-30 DIAGNOSIS — F17200 Nicotine dependence, unspecified, uncomplicated: Secondary | ICD-10-CM

## 2020-11-30 DIAGNOSIS — R519 Headache, unspecified: Secondary | ICD-10-CM

## 2020-11-30 DIAGNOSIS — R5382 Chronic fatigue, unspecified: Secondary | ICD-10-CM

## 2020-11-30 DIAGNOSIS — J0111 Acute recurrent frontal sinusitis: Secondary | ICD-10-CM

## 2020-11-30 DIAGNOSIS — M1990 Unspecified osteoarthritis, unspecified site: Secondary | ICD-10-CM

## 2020-11-30 DIAGNOSIS — F3289 Other specified depressive episodes: Secondary | ICD-10-CM | POA: Diagnosis not present

## 2020-11-30 DIAGNOSIS — U071 COVID-19: Secondary | ICD-10-CM | POA: Diagnosis not present

## 2020-11-30 MED ORDER — DOXYCYCLINE HYCLATE 100 MG PO TABS: 100.0000 mg | ORAL_TABLET | Freq: Two times a day (BID) | ORAL | 0 refills | Status: DC

## 2020-11-30 NOTE — Progress Notes (Signed)
Patient ID: Daniel May, male    DOB: 01-10-1966, 55 y.o.   MRN: [id number]  Virtual visit completed through [place], a video enabled telemedicine application. Due to national recommendations of social distancing due to COVID-19, a virtual visit is felt to be most appropriate for this patient at this time. Reviewed limitations, risks, security and privacy concerns of performing a virtual visit and the availability of in person appointments. I also reviewed that there may be a patient responsible charge related to this service. The patient agreed to proceed.   Patient location: home Provider location: [place] at [facility], office Persons participating in this virtual visit: patient, provider   If any vitals were documented, they were collected by patient at home unless specified below.    Temp 98.8 F (37.1 C)   Ht 5\' 10"  (1.778 m)   Wt 230 lb (104.3 kg)   BMI 33.00 kg/m    CC: COVID infection Subjective:   HPI: Daniel May is a 55 y.o. male presenting on 11/30/2020 for Fatigue (C/o still of fatigue, HA, sore throat, cough and head/chest congestion. Had 2 pos home COVID test, 10/17 and 11/21/20. )   Seen virtually 10/18/2020 with 1+ wk of headache, head congestion, fever, cough, ST body aches and nausea. He was seen the prior week at [facility] urgent care with negative COVID PCR test. When seen virtually, treated for sinusitis with augmentin 10d course. Mild improvement while on it but never fully better.  Plan at that time was return to work on 10/24/2020.  I was again contacted by patient on 11/15/2020 requesting STD forms filled out.  Out of work since 10/14/2020. Planning to return on Monday 12/05/2020.   Never felt better after initial augmentin course. He tested at home with positive COVID swab on 11/07/2020 and again on 11/25/2020.  Notes persistent fatigue (in h/o chronic fatigue), ongoing R>L frontal headache (chronic for 2 yrs), ST, cough, and head and chest congestion.  Notes intolerance to being supine due to worsening head congestion and post nasal drainage, not necessarily orthopnea. Ongoing sinus pressure and night time drainage is predominant concern.  Plan was return to work   No recent fever.  Exertional dyspnea (200 yards) and wheezing persists, no dyspnea on rest.  Albuterol is helpful.  Continued smoker 1/2 ppd.   Prior COVID illness 01/2020 - out of work for several months at that time as well.   Inflammatory arthritis - on MTX 10mg  weekly and folic acid 1mg  daily through rheumatology ([facility]).  Depression - continues paxil 40mg  daily and wellbutrin SR 100mg  daily.  Suspect OSA - saw neurology 02/2020 with recommendation to complete sleep study - has not completed yet.  Worried about cost.   Works at [employer], no significant physical labor.      Relevant past medical, surgical, family and social history reviewed and updated as indicated. Interim medical history since our last visit reviewed. Allergies and medications reviewed and updated. Outpatient Medications Prior to Visit  Medication Sig Dispense Refill   predniSONE (DELTASONE) 10 MG tablet Take 1 tablet (10 mg total) by mouth daily with breakfast.     albuterol (VENTOLIN HFA) 108 (90 Base) MCG/ACT inhaler Inhale 2 puffs into the lungs every 6 (six) hours as needed for wheezing or shortness of breath. 54 g 1   allopurinol (ZYLOPRIM) 100 MG tablet TAKE 1 TABLET BY MOUTH EVERY DAY 90 tablet 0   buPROPion ER (WELLBUTRIN SR) 100 MG 12 hr tablet Take 1  tablet (100 mg total) by mouth daily. 90 tablet 1   fluticasone (FLONASE) 50 MCG/ACT nasal spray Place 2 sprays into both nostrils daily. 48 g 1   folic acid (FOLVITE) 1 MG tablet Take 1 mg by mouth daily.     losartan (COZAAR) 50 MG tablet TAKE 1 TABLET BY MOUTH EVERY DAY 90 tablet 0   methotrexate 2.5 MG tablet Take 10 mg by mouth once a week.     metoprolol succinate (TOPROL-XL) 25 MG 24 hr tablet Take 0.5 tablets (12.5 mg  total) by mouth daily. 45 tablet 0   Multiple Vitamins-Minerals (MULTIVITAMIN ADULT PO) Take by mouth.     Omega-3 Fatty Acids (FISH OIL) 1200 MG CAPS Take 2 capsules by mouth daily.     PARoxetine (PAXIL) 40 MG tablet TAKE 1 TABLET BY MOUTH EVERY DAY 90 tablet 0   vitamin C (ASCORBIC ACID) 500 MG tablet Take 500 mg by mouth daily.     No facility-administered medications prior to visit.     Per HPI unless specifically indicated in ROS section below Review of Systems Objective:  Temp 98.8 F (37.1 C)   Ht 5\' 10"  (1.778 m)   Wt 230 lb (104.3 kg)   BMI 33.00 kg/m   Wt Readings from Last 3 Encounters:  11/30/20 230 lb (104.3 kg)  10/18/20 230 lb (104.3 kg)  04/25/20 260 lb (117.9 kg)       Physical exam: Gen: alert, NAD, not ill appearing Pulm: speaks in complete sentences without increased work of breathing Psych: normal mood, normal thought content      Assessment & Plan:   Problem List Items Addressed This Visit     Depression    Feels overall stable period on paxil and wellbutrin - continue.       Smoker    Has dropped to 1/2 ppd.  Encouraged full cessation.      Acute recurrent sinusitis - Primary    Possible recurrent sinusitis initially treated with augmentin without full resolution, now symptoms recurred in setting of positive COVID test, and now with ongoing sinusitis symptoms over the past month. Will treat with doxycycline 10d course, rec continue flonase. He already takes prednisone 10mg  daily for inflammatory arthritis through rheumatology.  Discussed possible ENT evaluation if ongoing symptoms despite above.       Relevant Medications   predniSONE (DELTASONE) 10 MG tablet   doxycycline (VIBRA-TABS) 100 MG tablet   Chronic fatigue    Ongoing. Anticipate OSA contributing, s/p neurology eval.  Encouraged he go ahead and schedule sleep study.       Nonintractable episodic headache    Ongoing. Has seen rheum.       Inflammatory arthritis    Ongoing.  Has seen rheum.       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 Outside of window for antiviral. This could explain prolonged symptoms.  See below.  If no improvement with treatment outlined, advised schedule in-person visit for further evaluation.         Meds ordered this encounter  Medications   doxycycline (VIBRA-TABS) 100 MG tablet    Sig: Take 1 tablet (100 mg total) by mouth 2 (two) times daily.    Dispense:  20 tablet    Refill:  0    No orders of the defined types were placed in  this encounter.   I discussed the assessment and treatment plan with the patient. The patient was provided an opportunity to ask questions and all were answered. The patient agreed with the plan and demonstrated an understanding of the instructions. The patient was advised to call back or seek an in-person evaluation if the symptoms worsen or if the condition fails to improve as anticipated.  Follow up plan: No follow-ups on file.  Ria Bush, MD

## 2020-11-30 NOTE — Assessment & Plan Note (Signed)
Feels overall stable period on paxil and wellbutrin - continue.

## 2020-11-30 NOTE — Assessment & Plan Note (Signed)
Ongoing. Has seen rheum.

## 2020-11-30 NOTE — Assessment & Plan Note (Addendum)
Has dropped to 1/2 ppd.  Encouraged full cessation.

## 2020-11-30 NOTE — Assessment & Plan Note (Signed)
Possible recurrent sinusitis initially treated with augmentin without full resolution, now symptoms recurred in setting of positive COVID test, and now with ongoing sinusitis symptoms over the past month. Will treat with doxycycline 10d course, rec continue flonase. He already takes prednisone 10mg  daily for inflammatory arthritis through rheumatology.  Discussed possible ENT evaluation if ongoing symptoms despite above.

## 2020-11-30 NOTE — Assessment & Plan Note (Addendum)
Ongoing. Anticipate OSA contributing, s/p neurology eval.  Encouraged he go ahead and schedule sleep study.

## 2020-11-30 NOTE — Assessment & Plan Note (Signed)
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 Outside of window for antiviral. This could explain prolonged symptoms.  See below.  If no improvement with treatment outlined, advised schedule in-person visit for further evaluation.

## 2020-12-02 ENCOUNTER — Telehealth: Payer: Self-pay | Admitting: Family Medicine

## 2020-12-02 NOTE — Telephone Encounter (Signed)
Pt is requesting FMLA paperwork to be sent to employer. Pt is supposed to return to work on Monday Fax # [phone] [employer] is [employer]

## 2020-12-06 NOTE — Telephone Encounter (Signed)
Faxed FMLA ppw on 12/01/20.

## 2020-12-08 ENCOUNTER — Encounter: Payer: Self-pay | Admitting: Family Medicine

## 2020-12-09 ENCOUNTER — Other Ambulatory Visit: Payer: Self-pay | Admitting: Family Medicine

## 2020-12-12 MED ORDER — LOSARTAN POTASSIUM 50 MG PO TABS
50.0000 mg | ORAL_TABLET | Freq: Every day | ORAL | 0 refills | Status: DC
Start: 2020-12-12 — End: 2021-06-05

## 2020-12-12 MED ORDER — PAROXETINE HCL 40 MG PO TABS
40.0000 mg | ORAL_TABLET | Freq: Every day | ORAL | 0 refills | Status: DC
Start: 2020-12-12 — End: 2021-06-07

## 2020-12-12 NOTE — Telephone Encounter (Signed)
LMTCB to schedule appt for tomorrow with Dr.G

## 2020-12-12 NOTE — Telephone Encounter (Signed)
Please schedule OV for tomorrow in person as I just had a cancellation.

## 2020-12-12 NOTE — Telephone Encounter (Signed)
Pt scheduled OV on 12/13/20 at 10:30 with Dr. Darnell Level.

## 2020-12-13 ENCOUNTER — Ambulatory Visit: Payer: BC Managed Care – PPO | Admitting: Family Medicine

## 2020-12-13 ENCOUNTER — Other Ambulatory Visit: Payer: Self-pay

## 2020-12-13 ENCOUNTER — Encounter: Payer: Self-pay | Admitting: Family Medicine

## 2020-12-13 VITALS — BP 136/84 | HR 89 | Temp 98.4°F | Ht 70.0 in | Wt 251.4 lb

## 2020-12-13 DIAGNOSIS — R5382 Chronic fatigue, unspecified: Secondary | ICD-10-CM

## 2020-12-13 DIAGNOSIS — U071 COVID-19: Secondary | ICD-10-CM

## 2020-12-13 DIAGNOSIS — M1380 Other specified arthritis, unspecified site: Secondary | ICD-10-CM

## 2020-12-13 DIAGNOSIS — F3289 Other specified depressive episodes: Secondary | ICD-10-CM

## 2020-12-13 DIAGNOSIS — J321 Chronic frontal sinusitis: Secondary | ICD-10-CM | POA: Diagnosis not present

## 2020-12-13 DIAGNOSIS — R519 Headache, unspecified: Secondary | ICD-10-CM

## 2020-12-13 DIAGNOSIS — M1990 Unspecified osteoarthritis, unspecified site: Secondary | ICD-10-CM

## 2020-12-13 NOTE — Progress Notes (Signed)
Patient ID: Daniel May, male    DOB: 1965/04/18, 55 y.o.   MRN: [id number]  This visit was conducted in person.  BP 136/84   Pulse 89   Temp 98.4 F (36.9 C) (Temporal)   Ht 5\' 10"  (1.778 m)   Wt 251 lb 6 oz (114 kg)   SpO2 97%   BMI 36.07 kg/m    CC: ongoing respiratory symptoms  Subjective:   HPI: Daniel May is a 55 y.o. male presenting on 12/13/2020 for Follow-up (C/o long term COVID sxs- ringing in ears, irritable, nausea, memory loss, HA and sometimes head congestion. )   See prior note for details.  Seen virtually 11/30/2020 with ongoing fatigue, R>L frontal headaches, ST, cough and head/chest congestion. At that time treated for presumed acute recurrent frontal sinusitis with 10d doxycycline course. He also is on prednisone 10mg  daily for inflammatory arthritis through rheumatology - due for f/u.   Out of work since 10/18/2020, treated for sinusitis at that time with augmentin. Never felt better and tested positive for COVID on 11/07/2020.  Predominant concern is ongoing sinus pressure and night time drainage. This is despite regular flonase use.   Plan when last seen was to return to work on 11/14.  He sent MyChart message on 11/30/2020 as he remained out of work due to ongoing malaise.  Feels respiratory/sinus symptoms are improving and plans to return to work tomorrow.  Planning to have house tested for mold.   Today notes more difficulty with concentration, increased impatience, and increase in anxiety. "Tired of being sick".   Depression - continues paxil 40mg  daily and wellbutrin SR 100mg  daily.  Inflammatory arthritis - on MTX 10mg  weekly and folic acid 1mg  daily through rheumatology ([facility]).  Suspect OSA - saw neurology 02/2020 with recommendation to complete sleep study - has not completed yet     Relevant past medical, surgical, family and social history reviewed and updated as indicated. Interim medical history since our last visit reviewed. Allergies and  medications reviewed and updated. Outpatient Medications Prior to Visit  Medication Sig Dispense Refill   albuterol (VENTOLIN HFA) 108 (90 Base) MCG/ACT inhaler Inhale 2 puffs into the lungs every 6 (six) hours as needed for wheezing or shortness of breath. 54 g 1   allopurinol (ZYLOPRIM) 100 MG tablet TAKE 1 TABLET BY MOUTH EVERY DAY 90 tablet 0   doxycycline (VIBRA-TABS) 100 MG tablet Take 1 tablet (100 mg total) by mouth 2 (two) times daily. 20 tablet 0   fluticasone (FLONASE) 50 MCG/ACT nasal spray Place 2 sprays into both nostrils daily. 48 g 1   folic acid (FOLVITE) 1 MG tablet Take 1 mg by mouth daily.     losartan (COZAAR) 50 MG tablet Take 1 tablet (50 mg total) by mouth daily. 90 tablet 0   methotrexate 2.5 MG tablet Take 10 mg by mouth once a week.     metoprolol succinate (TOPROL-XL) 25 MG 24 hr tablet Take 0.5 tablets (12.5 mg total) by mouth daily. 45 tablet 0   Multiple Vitamins-Minerals (MULTIVITAMIN ADULT PO) Take by mouth.     Omega-3 Fatty Acids (FISH OIL) 1200 MG CAPS Take 2 capsules by mouth daily.     PARoxetine (PAXIL) 40 MG tablet Take 1 tablet (40 mg total) by mouth daily. 90 tablet 0   predniSONE (DELTASONE) 10 MG tablet Take 1 tablet (10 mg total) by mouth daily with breakfast.     vitamin C (ASCORBIC ACID) 500 MG tablet Take 500  mg by mouth daily.     buPROPion ER (WELLBUTRIN SR) 100 MG 12 hr tablet Take 1 tablet (100 mg total) by mouth daily. 90 tablet 1   buPROPion ER (WELLBUTRIN SR) 100 MG 12 hr tablet Take 2 tablets (200 mg total) by mouth daily.     No facility-administered medications prior to visit.     Per HPI unless specifically indicated in ROS section below Review of Systems  Objective:  BP 136/84   Pulse 89   Temp 98.4 F (36.9 C) (Temporal)   Ht 5\' 10"  (1.778 m)   Wt 251 lb 6 oz (114 kg)   SpO2 97%   BMI 36.07 kg/m   Wt Readings from Last 3 Encounters:  12/13/20 251 lb 6 oz (114 kg)  11/30/20 230 lb (104.3 kg)  10/18/20 230 lb (104.3 kg)       Physical Exam Vitals and nursing note reviewed.  Constitutional:      Appearance: Normal appearance. He is not ill-appearing.  HENT:     Head: Normocephalic and atraumatic.     Right Ear: Hearing, tympanic membrane, ear canal and external ear normal. There is no impacted cerumen.     Left Ear: Hearing, tympanic membrane, ear canal and external ear normal. There is no impacted cerumen.     Nose: Mucosal edema and congestion present. No rhinorrhea.     Right Turbinates: Enlarged.     Left Turbinates: Enlarged.     Right Sinus: No maxillary sinus tenderness or frontal sinus tenderness.     Left Sinus: No maxillary sinus tenderness or frontal sinus tenderness.     Mouth/Throat:     Mouth: Mucous membranes are moist.     Pharynx: Oropharynx is clear. No oropharyngeal exudate or posterior oropharyngeal erythema.  Eyes:     Extraocular Movements: Extraocular movements intact.     Conjunctiva/sclera: Conjunctivae normal.     Pupils: Pupils are equal, round, and reactive to light.  Cardiovascular:     Rate and Rhythm: Normal rate and regular rhythm.     Pulses: Normal pulses.     Heart sounds: Normal heart sounds. No murmur heard. Pulmonary:     Effort: Pulmonary effort is normal. No respiratory distress.     Breath sounds: Normal breath sounds. No wheezing, rhonchi or rales.  Musculoskeletal:     Cervical back: Normal range of motion and neck supple. No rigidity.     Right lower leg: No edema.     Left lower leg: No edema.  Lymphadenopathy:     Cervical: No cervical adenopathy.  Skin:    General: Skin is warm and dry.     Findings: No rash.  Neurological:     Mental Status: He is alert.  Psychiatric:        Mood and Affect: Mood normal.        Behavior: Behavior normal.      Results for orders placed or performed during the hospital encounter of 10/26/19  Surgical pathology  Result Value Ref Range   SURGICAL PATHOLOGY      SURGICAL PATHOLOGY CASE:  [id number] PATIENT: Daniel May Surgical Pathology Report     Clinical History: headache with orthostatic component (cm)   FINAL MICROSCOPIC DIAGNOSIS:  A. TEMPORAL ARTERY, RIGHT, BIOPSY: -  Benign vascular wall without evidence of inflammation   GROSS DESCRIPTION:  The specimen is received in formalin and consists of a tubular segment of tan-pink soft tissue, measuring 0.6 cm in length by 0.2 cm in  diameter.  Specimen is bisected and entirely submitted in 1 cassette. Craig Staggers 07/26/2019)    Final Diagnosis performed by Thressa Sheller, MD.   Electronically signed 10/27/2019 Technical and / or Professional components performed at [facility]. [facility], [place] [address] [place], [place] [address].  Immunohistochemistry Technical component (if applicable) was performed at [facility]. [address] [place], [place], [place] [address].   IMMUNOHISTOCHEMISTRY DISCLAIMER (if applicable): Some of these immunohistoche mical stains may have been developed and the performance characteristics determine by [facility]. Some may not have been cleared or approved by the U.S. Food and Drug Administration. The FDA has determined that such clearance or approval is not necessary. This test is used for clinical purposes. It should not be regarded as investigational or for research. This laboratory is certified under the [place] (CLIA-88) as qualified to perform high complexity clinical laboratory testing.  The controls stained appropriately.     Assessment & Plan:  This visit occurred during the SARS-CoV-2 public health emergency.  Safety protocols were in place, including screening questions prior to the visit, additional usage of staff PPE, and extensive cleaning of exam room while observing appropriate contact time as indicated for disinfecting solutions.   Problem List Items Addressed This Visit      Depression    Ongoing depression manifesting as increased irritability inattention and anxiety - despite paxil 40mg  daily and wellbutrin 100mg  daily - will increase wellbutrin to 200mg  daily, update with effect and if doing better, will send in 200mg  dose.       Relevant Medications   buPROPion ER (WELLBUTRIN SR) 100 MG 12 hr tablet   Chronic sinusitis - Primary    Suspect component of chronic sinus inflammation given ongoing symptoms of pressure and drainage despite regular flonase. He does feel this is slowly improving and plans to return to work this week. I don't see signs of ongoing bacterial sinusitis or indication for further antibiotics at this time. Advised let us know if sinus symptoms recur or worsen for ENT evaluation.  Also planning to have house tested for mold or other cause of ongoing sinus/allergy symptoms.      Chronic fatigue    Overdue for OSA evaluation - advised to call and schedule once sinus symptoms have improved.       Nonintractable episodic headache   Relevant Medications   buPROPion ER (WELLBUTRIN SR) 100 MG 12 hr tablet   Inflammatory arthritis    Overdue for rheum f/u - last seen 11/2019. Advised call and schedule appt.       COVID-19 virus infection    Possible component of post-COVID syndrome with ongoing fatigue, sinus pressure, malaise. However fatigue HA and malaise have been chronic since prior to recurrent COVID - see above. Regardless, planning to return to work this week - letter provided accordingly.         No orders of the defined types were placed in this encounter.  No orders of the defined types were placed in this encounter.   Patient Instructions  Increase wellbutrin to 200mg  daily in the morning, update me with effect after 1-2 weeks.  If ongoing sinus symptoms let me know for referral to ENT.  Get sleep study done once sinus symptoms are improved.  Schedule rheumatology follow up Return for physical over next 1-2 months.    Follow up plan: Return in about 6 weeks (around 01/24/2021), or if symptoms worsen or fail to improve,  for annual exam, prior fasting for blood work.  Ria Bush, MD

## 2020-12-13 NOTE — Patient Instructions (Addendum)
Increase wellbutrin to 200mg  daily in the morning, update me with effect after 1-2 weeks.  If ongoing sinus symptoms let me know for referral to ENT.  Get sleep study done once sinus symptoms are improved.  Schedule rheumatology follow up Return for physical over next 1-2 months.

## 2020-12-18 NOTE — Assessment & Plan Note (Addendum)
Ongoing depression manifesting as increased irritability inattention and anxiety - despite paxil 40mg  daily and wellbutrin 100mg  daily - will increase wellbutrin to 200mg  daily, update with effect and if doing better, will send in 200mg  dose.

## 2020-12-18 NOTE — Assessment & Plan Note (Addendum)
Overdue for rheum f/u - last seen 11/2019. Advised call and schedule appt.

## 2020-12-18 NOTE — Addendum Note (Signed)
Addended by: Ria Bush on: 12/18/2020 07:40 PM   Modules accepted: Level of Service

## 2020-12-18 NOTE — Assessment & Plan Note (Signed)
Possible component of post-COVID syndrome with ongoing fatigue, sinus pressure, malaise. However fatigue HA and malaise have been chronic since prior to recurrent COVID - see above. Regardless, planning to return to work this week - letter provided accordingly.

## 2020-12-18 NOTE — Assessment & Plan Note (Addendum)
Suspect component of chronic sinus inflammation given ongoing symptoms of pressure and drainage despite regular flonase. He does feel this is slowly improving and plans to return to work this week. I don't see signs of ongoing bacterial sinusitis or indication for further antibiotics at this time. Advised let us know if sinus symptoms recur or worsen for ENT evaluation.  Also planning to have house tested for mold or other cause of ongoing sinus/allergy symptoms.

## 2020-12-18 NOTE — Assessment & Plan Note (Signed)
Overdue for OSA evaluation - advised to call and schedule once sinus symptoms have improved.

## 2020-12-21 ENCOUNTER — Ambulatory Visit: Payer: BC Managed Care – PPO | Admitting: Family Medicine

## 2020-12-29 ENCOUNTER — Other Ambulatory Visit: Payer: Self-pay

## 2020-12-29 MED ORDER — ALLOPURINOL 100 MG PO TABS
100.0000 mg | ORAL_TABLET | Freq: Every day | ORAL | 0 refills | Status: DC
Start: 2020-12-29 — End: 2021-06-05

## 2020-12-29 NOTE — Telephone Encounter (Signed)
E-scribed refill 

## 2021-01-02 ENCOUNTER — Encounter: Payer: Self-pay | Admitting: Family Medicine

## 2021-01-09 NOTE — Telephone Encounter (Signed)
Appt scheduled for this week.

## 2021-01-11 ENCOUNTER — Other Ambulatory Visit: Payer: Self-pay

## 2021-01-11 ENCOUNTER — Telehealth (INDEPENDENT_AMBULATORY_CARE_PROVIDER_SITE_OTHER): Payer: BC Managed Care – PPO | Admitting: Family Medicine

## 2021-01-11 ENCOUNTER — Encounter: Payer: Self-pay | Admitting: Family Medicine

## 2021-01-11 VITALS — Temp 99.1°F | Ht 70.0 in | Wt 250.0 lb

## 2021-01-11 DIAGNOSIS — F3289 Other specified depressive episodes: Secondary | ICD-10-CM | POA: Diagnosis not present

## 2021-01-11 DIAGNOSIS — J321 Chronic frontal sinusitis: Secondary | ICD-10-CM

## 2021-01-11 DIAGNOSIS — R519 Headache, unspecified: Secondary | ICD-10-CM

## 2021-01-11 DIAGNOSIS — R5382 Chronic fatigue, unspecified: Secondary | ICD-10-CM

## 2021-01-11 DIAGNOSIS — M1A09X0 Idiopathic chronic gout, multiple sites, without tophus (tophi): Secondary | ICD-10-CM | POA: Diagnosis not present

## 2021-01-11 DIAGNOSIS — Z79631 Long term (current) use of antimetabolite agent: Secondary | ICD-10-CM

## 2021-01-11 DIAGNOSIS — E785 Hyperlipidemia, unspecified: Secondary | ICD-10-CM

## 2021-01-11 DIAGNOSIS — M1990 Unspecified osteoarthritis, unspecified site: Secondary | ICD-10-CM

## 2021-01-11 MED ORDER — BUPROPION HCL ER (SR) 150 MG PO TB12
150.0000 mg | ORAL_TABLET | Freq: Every day | ORAL | 3 refills | Status: DC
Start: 2021-01-11 — End: 2021-06-07

## 2021-01-11 NOTE — Assessment & Plan Note (Signed)
Hasn't tolerated higher dose wellbutrin SR 200mg  well due to increased agitation/restless energy - will send in 150mg  dose.

## 2021-01-11 NOTE — Assessment & Plan Note (Signed)
Ongoing, somewhat better. Advised call to schedule sleep study. He has #.

## 2021-01-11 NOTE — Assessment & Plan Note (Signed)
He stopped MTX and prednisone 10mg  dose 10 days ago and has felt better off medications.  Will not do prednisone taper as he's feeling well (no signs of steroid withdrawal or adrenal suppression) and it's been 10+ days since he stopped med.  I did ask him to call rheum to schedule f/u as overdue. a

## 2021-01-11 NOTE — Assessment & Plan Note (Signed)
Update levels when he returns.

## 2021-01-11 NOTE — Progress Notes (Signed)
Patient ID: Daniel May, male    DOB: 1965-06-11, 55 y.o.   MRN: [id number]  Virtual visit completed through [employer], a video enabled telemedicine application. Due to national recommendations of social distancing due to COVID-19, a virtual visit is felt to be most appropriate for this patient at this time. Reviewed limitations, risks, security and privacy concerns of performing a virtual visit and the availability of in person appointments. I also reviewed that there may be a patient responsible charge related to this service. The patient agreed to proceed.   Patient location: home Provider location: [place] [facility], office Persons participating in this virtual visit: patient, provider   If any vitals were documented, they were collected by patient at home unless specified below.    Temp 99.1 F (37.3 C)    Ht 5\' 10"  (1.778 m)    Wt 250 lb (113.4 kg)    BMI 35.87 kg/m    CC: discuss ongoing out of work  Subjective:   HPI: Daniel May is a 55 y.o. male presenting on 01/11/2021 for Medication Management (Wants to discuss going back to work, results of home mold testing and medications. )   See prior notes for details.  Seen virtually 11/98/2022 and in person 12/13/2020 with ongoing fatigue, R>L frontal headaches, ST, cough, congestion. Out of work since 10/18/2020. Tested positive for COVID 11/07/2020, with residual persistent sinus pressure headache and drainage despite regular flonase and several antibiotic courses including doxycycline and augmentin.   Depression - continues paxil 40mg  daily and wellbutrin SR 200mg  daily - hasn't tolerated higher dose well, requests change to 150mg .  Inflammatory arthritis - on MTX 10mg  weekly and folic acid 1mg  daily through rheumatology ([facility]). Overdue for f/u (last seen 11/2019).  Suspect OSA - saw neurology 02/2020 with recommendation to complete sleep study - has not completed yet   Has been unable to return to work despite multiple  attempts due to ongoing headache, nausea and fatigue.  Sinus symptoms have significantly improved.  House was tested for mold - negative evaluation.   He stopped taking methotrexate and prednisone 10 days ago and has been feeling better off these medicines. Planning to retry work on Tuesday 01/17/2021 - 12 hour shifts, states no option to do shorter work days.  Headache and joint pains have improved, unclear why.      Relevant past medical, surgical, family and social history reviewed and updated as indicated. Interim medical history since our last visit reviewed. Allergies and medications reviewed and updated. Outpatient Medications Prior to Visit  Medication Sig Dispense Refill   albuterol (VENTOLIN HFA) 108 (90 Base) MCG/ACT inhaler Inhale 2 puffs into the lungs every 6 (six) hours as needed for wheezing or shortness of breath. 54 g 1   allopurinol (ZYLOPRIM) 100 MG tablet Take 1 tablet (100 mg total) by mouth daily. 90 tablet 0   fluticasone (FLONASE) 50 MCG/ACT nasal spray Place 2 sprays into both nostrils daily. 48 g 1   losartan (COZAAR) 50 MG tablet Take 1 tablet (50 mg total) by mouth daily. 90 tablet 0   metoprolol succinate (TOPROL-XL) 25 MG 24 hr tablet Take 0.5 tablets (12.5 mg total) by mouth daily. 45 tablet 0   Multiple Vitamins-Minerals (MULTIVITAMIN ADULT PO) Take by mouth.     Omega-3 Fatty Acids (FISH OIL) 1200 MG CAPS Take 2 capsules by mouth daily.     PARoxetine (PAXIL) 40 MG tablet Take 1 tablet (40 mg total) by mouth daily. 90 tablet 0  vitamin C (ASCORBIC ACID) 500 MG tablet Take 500 mg by mouth daily.     buPROPion ER (WELLBUTRIN SR) 100 MG 12 hr tablet Take 2 tablets (200 mg total) by mouth daily.     doxycycline (VIBRA-TABS) 100 MG tablet Take 1 tablet (100 mg total) by mouth 2 (two) times daily. 20 tablet 0   folic acid (FOLVITE) 1 MG tablet Take 1 mg by mouth daily.     methotrexate 2.5 MG tablet Take 10 mg by mouth once a week.     predniSONE (DELTASONE) 10  MG tablet Take 1 tablet (10 mg total) by mouth daily with breakfast.     No facility-administered medications prior to visit.     Per HPI unless specifically indicated in ROS section below Review of Systems Objective:  Temp 99.1 F (37.3 C)    Ht 5\' 10"  (1.778 m)    Wt 250 lb (113.4 kg)    BMI 35.87 kg/m   Wt Readings from Last 3 Encounters:  01/11/21 250 lb (113.4 kg)  12/13/20 251 lb 6 oz (114 kg)  11/30/20 230 lb (104.3 kg)       Physical exam: Gen: alert, NAD, not ill appearing Pulm: speaks in complete sentences without increased work of breathing Psych: normal mood, normal thought content      Assessment & Plan:   Problem List Items Addressed This Visit     Depression    Hasn't tolerated higher dose wellbutrin SR 200mg  well due to increased agitation/restless energy - will send in 150mg  dose.       Relevant Medications   buPROPion ER (WELLBUTRIN SR) 150 MG 12 hr tablet   Dyslipidemia   Relevant Orders   Lipid panel   Comprehensive metabolic panel   Gout    Update levels when he returns.      Relevant Orders   Uric acid   Chronic sinusitis    Sinus symptoms have significantly improved.       Chronic fatigue - Primary    Ongoing, somewhat better. Advised call to schedule sleep study. He has #.      Relevant Orders   Comprehensive metabolic panel   TSH   CBC with Differential/Platelet   Sedimentation rate   B. burgdorfi antibodies by WB   C-reactive protein   Ferritin   Nonintractable episodic headache    This is improved recently.       Relevant Medications   buPROPion ER (WELLBUTRIN SR) 150 MG 12 hr tablet   Inflammatory arthritis    He stopped MTX and prednisone 10mg  dose 10 days ago and has felt better off medications.  Will not do prednisone taper as he's feeling well (no signs of steroid withdrawal or adrenal suppression) and it's been 10+ days since he stopped med.  I did ask him to call rheum to schedule f/u as overdue. a       Relevant Orders   Sedimentation rate   C-reactive protein   Methotrexate, long term, current use    Pt stopped MTX ~12/31/2020        Meds ordered this encounter  Medications   buPROPion ER (WELLBUTRIN SR) 150 MG 12 hr tablet    Sig: Take 1 tablet (150 mg total) by mouth daily.    Dispense:  90 tablet    Refill:  3    To replace 100mg  dose   Orders Placed This Encounter  Procedures   Lipid panel    Standing Status:   Future  Standing Expiration Date:   01/11/2022   Comprehensive metabolic panel    Standing Status:   Future    Standing Expiration Date:   01/11/2022   TSH    Standing Status:   Future    Standing Expiration Date:   01/11/2022   CBC with Differential/Platelet    Standing Status:   Future    Standing Expiration Date:   01/11/2022   Uric acid    Standing Status:   Future    Standing Expiration Date:   01/11/2022   Sedimentation rate    Standing Status:   Future    Standing Expiration Date:   01/11/2022   B. burgdorfi antibodies by WB    Standing Status:   Future    Standing Expiration Date:   01/11/2022   C-reactive protein    Standing Status:   Future    Standing Expiration Date:   01/11/2022   Ferritin    Standing Status:   Future    Standing Expiration Date:   01/11/2022    I discussed the assessment and treatment plan with the patient. The patient was provided an opportunity to ask questions and all were answered. The patient agreed with the plan and demonstrated an understanding of the instructions. The patient was advised to call back or seek an in-person evaluation if the symptoms worsen or if the condition fails to improve as anticipated.  Follow up plan: No follow-ups on file.  Ria Bush, MD

## 2021-01-11 NOTE — Assessment & Plan Note (Signed)
This is improved recently.

## 2021-01-11 NOTE — Assessment & Plan Note (Signed)
Sinus symptoms have significantly improved.

## 2021-01-11 NOTE — Assessment & Plan Note (Signed)
Pt stopped MTX ~12/31/2020

## 2021-01-15 ENCOUNTER — Encounter: Payer: Self-pay | Admitting: Family Medicine

## 2021-01-16 ENCOUNTER — Encounter: Payer: Self-pay | Admitting: Family Medicine

## 2021-01-16 NOTE — Telephone Encounter (Signed)
See other pt message

## 2021-01-18 ENCOUNTER — Other Ambulatory Visit: Payer: BC Managed Care – PPO

## 2021-01-24 ENCOUNTER — Other Ambulatory Visit: Payer: BC Managed Care – PPO

## 2021-01-24 ENCOUNTER — Telehealth: Payer: Self-pay | Admitting: *Deleted

## 2021-01-24 NOTE — Telephone Encounter (Signed)
LMTCB to reschedule lab

## 2021-01-24 NOTE — Telephone Encounter (Signed)
PLEASE NOTE: All timestamps contained within this report are represented as [place] Standard Time. CONFIDENTIALTY NOTICE: This fax transmission is intended only for the addressee. It contains information that is legally privileged, confidential or otherwise protected from use or disclosure. If you are not the intended recipient, you are strictly prohibited from reviewing, disclosing, copying using or disseminating any of this information or taking any action in reliance on or regarding this information. If you have received this fax in error, please notify us immediately by telephone so that we can arrange for its return to [place]. Phone: [phone], Toll-Free: [phone], Fax: [phone] Page: 1 of 1 Call Id: [id number] [facility] [place] [facility] - Client Nonclinical Telephone Record  [facility] Client [place] [facility] - Client Client Site [place] Provider Ria Bush - MD Contact Type Call Who Is Calling Patient / Member / Family / Caregiver Caller Name Daniel May Caller Phone Number [phone] Patient Name Daniel May Patient DOB 27-Feb-1965 Call Type Message Only Information Provided Reason for Call Request to Reschedule Office Appointment Initial Comment Caller states he would like to reschedule an appointment due to possible COVID exposed. Patient request to speak to RN No Disp. Time Disposition Final User 01/24/2021 6:58:39 AM General Information Provided Yes Faythe Dingwall, [place] Call Closed By: Lezlie Octave Transaction Date/Time: 01/24/2021 6:55:57 AM (ET)

## 2021-01-26 NOTE — Telephone Encounter (Signed)
2 nd attempt to schedule pt for lab also sent my chart letter

## 2021-01-31 NOTE — Telephone Encounter (Signed)
3rd attempt to reach out to pt

## 2021-02-06 ENCOUNTER — Encounter: Payer: Self-pay | Admitting: Family Medicine

## 2021-02-07 NOTE — Telephone Encounter (Signed)
Lvm asking pt to call back.  Needs to schedule MyChart video visit.   I'm also sending MyChart message to pt.

## 2021-02-13 ENCOUNTER — Other Ambulatory Visit: Payer: Self-pay

## 2021-02-13 ENCOUNTER — Encounter: Payer: Self-pay | Admitting: Family Medicine

## 2021-02-13 ENCOUNTER — Telehealth (INDEPENDENT_AMBULATORY_CARE_PROVIDER_SITE_OTHER): Payer: BC Managed Care – PPO | Admitting: Family Medicine

## 2021-02-13 VITALS — Temp 98.0°F | Ht 70.0 in | Wt 250.0 lb

## 2021-02-13 DIAGNOSIS — A084 Viral intestinal infection, unspecified: Secondary | ICD-10-CM

## 2021-02-13 DIAGNOSIS — M1990 Unspecified osteoarthritis, unspecified site: Secondary | ICD-10-CM | POA: Diagnosis not present

## 2021-02-13 DIAGNOSIS — F3289 Other specified depressive episodes: Secondary | ICD-10-CM

## 2021-02-13 DIAGNOSIS — J111 Influenza due to unidentified influenza virus with other respiratory manifestations: Secondary | ICD-10-CM | POA: Insufficient documentation

## 2021-02-13 MED ORDER — ONDANSETRON HCL 4 MG PO TABS: 4.0000 mg | ORAL_TABLET | Freq: Four times a day (QID) | ORAL | 0 refills | Status: DC | PRN

## 2021-02-13 NOTE — Progress Notes (Signed)
Patient ID: Daniel May, male    DOB: July 27, 1965, 56 y.o.   MRN: [id number]  Virtual visit completed through [employer], a video enabled telemedicine application. Due to national recommendations of social distancing due to COVID-19, a virtual visit is felt to be most appropriate for this patient at this time. Reviewed limitations, risks, security and privacy concerns of performing a virtual visit and the availability of in person appointments. I also reviewed that there may be a patient responsible charge related to this service. The patient agreed to proceed.   Interactive audio and video telecommunications were attempted between myself and Daniel May, however failed due to patient having technical difficulties OR patient not having access to video capability.  We continued and completed visit with audio only.  Time: 9:45am - 9:57am   Patient location: home Provider location: [place] [facility], office Persons participating in this virtual visit: patient, provider   If any vitals were documented, they were collected by patient at home unless specified below.    Temp 98 F (36.7 C)    Ht 5\' 10"  (1.778 m)    Wt 250 lb (113.4 kg)    BMI 35.87 kg/m    CC: GI illness Subjective:   HPI: Daniel May is a 56 y.o. male presenting on 02/13/2021 for Generalized Body Aches (C/o body aches, nausea, vomiting, diarrhea and severe HA.  Sxs started 02/04/21. Tried kaopectate, helpful. Sxs seem to be improving. )   1+ wk h/o body aches, nausea, vomiting, diarrhea and severe headache. Diarrhea x5-6/day, 4 vomiting episodes. Headache described as throbbing pain throughout entire head.  Nausea continues but is slowly improving along with slowing diarrhea.   No fevers/chills, cough, significant respiratory symptoms. No chest pain or dyspnea. No abd pain, blood or mucous in stool.   Wife sick with similar illness prior - they think this was the stomach flu.   Treated with nauzene, kaopectate with  benefit.   He has been out of work since Saturday 02/04/2021.   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 He stopped methotrexate and prednisone early 12/2020.   Request work excuse through [employer]      Relevant past medical, surgical, family and social history reviewed and updated as indicated. Interim medical history since our last visit reviewed. Allergies and medications reviewed and updated. Outpatient Medications Prior to Visit  Medication Sig Dispense Refill   albuterol (VENTOLIN HFA) 108 (90 Base) MCG/ACT inhaler Inhale 2 puffs into the lungs every 6 (six) hours as needed for wheezing or shortness of breath. 54 g 1   allopurinol (ZYLOPRIM) 100 MG tablet Take 1 tablet (100 mg total) by mouth daily. 90 tablet 0   buPROPion ER (WELLBUTRIN SR) 150 MG 12 hr tablet Take 1 tablet (150 mg total) by mouth daily. 90 tablet 3   fluticasone (FLONASE) 50 MCG/ACT nasal spray Place 2 sprays into both nostrils daily. 48 g 1   losartan (COZAAR) 50 MG tablet Take 1 tablet (50 mg total) by mouth daily. 90 tablet 0   metoprolol succinate (TOPROL-XL) 25 MG 24 hr tablet Take 0.5 tablets (12.5 mg total) by mouth daily. 45 tablet 0   Multiple Vitamins-Minerals (MULTIVITAMIN ADULT PO) Take by mouth.     Omega-3 Fatty Acids (FISH OIL) 1200 MG CAPS Take 2 capsules by mouth daily.  PARoxetine (PAXIL) 40 MG tablet Take 1 tablet (40 mg total) by mouth daily. 90 tablet 0   vitamin C (ASCORBIC ACID) 500 MG tablet Take 500 mg by mouth daily.     No facility-administered medications prior to visit.     Per HPI unless specifically indicated in ROS section below Review of Systems Objective:  Temp 98 F (36.7 C)    Ht 5\' 10"  (1.778 m)    Wt 250 lb (113.4 kg)    BMI 35.87 kg/m   Wt Readings from Last 3 Encounters:  02/13/21 250 lb (113.4 kg)   01/11/21 250 lb (113.4 kg)  12/13/20 251 lb 6 oz (114 kg)       Physical exam: Gen: alert, NAD, not ill appearing Pulm: speaks in complete sentences without increased work of breathing Psych: normal mood, normal thought content      Assessment & Plan:   Problem List Items Addressed This Visit     Depression    Better period on paxil 40mg  and wellbutrin 150mg  daily.       Viral gastroenteritis - Primary    Anticipate viral gastroenteritis given symptoms.  Symptoms are improving.  Will Rx zofran PRN nausea, reviewed further supportive measures at home.  Letter for work provided today.       Inflammatory arthritis    Still needs to schedule rheumatology f/u. He's been off prednisone and methotrexate since early 12/2020.         Meds ordered this encounter  Medications   ondansetron (ZOFRAN) 4 MG tablet    Sig: Take 1 tablet (4 mg total) by mouth every 6 (six) hours as needed for nausea or vomiting.    Dispense:  20 tablet    Refill:  0   No orders of the defined types were placed in this encounter.   I discussed the assessment and treatment plan with the patient. The patient was provided an opportunity to ask questions and all were answered. The patient agreed with the plan and demonstrated an understanding of the instructions. The patient was advised to call back or seek an in-person evaluation if the symptoms worsen or if the condition fails to improve as anticipated.  Follow up plan: Return if symptoms worsen or fail to improve.  Ria Bush, MD

## 2021-02-13 NOTE — Assessment & Plan Note (Addendum)
Anticipate viral gastroenteritis given symptoms.  Symptoms are improving.  Will Rx zofran PRN nausea, reviewed further supportive measures at home.  Letter for work provided today.

## 2021-02-13 NOTE — Assessment & Plan Note (Signed)
Still needs to schedule rheumatology f/u. He's been off prednisone and methotrexate since early 12/2020.

## 2021-02-13 NOTE — Assessment & Plan Note (Signed)
Better period on paxil 40mg  and wellbutrin 150mg  daily.

## 2021-02-14 ENCOUNTER — Telehealth: Payer: Self-pay

## 2021-02-14 NOTE — Telephone Encounter (Signed)
Received faxed Short Term Disability (STD) form from [place].  Placed form in Dr. Synthia Innocent box.

## 2021-02-22 NOTE — Telephone Encounter (Signed)
Lvm asking pt to call back.  Need to know what date he returned to work.

## 2021-02-22 NOTE — Telephone Encounter (Addendum)
Form filled and placed in Lisa's box Please confirm with patient to see when he returned to work.

## 2021-02-23 NOTE — Telephone Encounter (Signed)
Lvm asking pt to call back.  Need to know what date he returned to work.   [Form is in basket in [employer].]

## 2021-02-24 NOTE — Telephone Encounter (Signed)
Lvm asking pt to call back.  Need to know what date he returned to work.   [Form is in basket in Lisa's desk.]  I also sent a MyChart message.

## 2021-02-27 NOTE — Telephone Encounter (Signed)
Sent to pt.

## 2021-02-27 NOTE — Telephone Encounter (Addendum)
Documented return-to-work dated and faxed form to [place] at [phone].  [Made copy to scan.  Mailed original to pt for his records.]

## 2021-03-02 ENCOUNTER — Encounter: Payer: Self-pay | Admitting: Family Medicine

## 2021-03-10 ENCOUNTER — Other Ambulatory Visit: Payer: Self-pay | Admitting: Family Medicine

## 2021-03-15 ENCOUNTER — Other Ambulatory Visit: Payer: Self-pay | Admitting: Family Medicine

## 2021-04-03 ENCOUNTER — Telehealth: Payer: Self-pay

## 2021-04-03 ENCOUNTER — Encounter: Payer: BC Managed Care – PPO | Admitting: Family Medicine

## 2021-04-03 NOTE — Telephone Encounter (Signed)
[facility] - Client ?Nonclinical Telephone Record  ?[facility]? ?Client [facility] - Client ?Client Site [place] ?Provider Ria Bush - MD ?Contact Type Call ?Who Is Calling Patient / Member / Family / Caregiver ?Caller Name Jadore Veals ?Caller Phone Number [phone] ?Patient Name Daniel May ?Patient DOB 1965/07/12 ?Call Type Message Only Information Provided ?Reason for Call Request to [facility] Appointment ?Initial Comment Caller states they need to cancel their appointment since they have to go to work. ?Patient request to speak to RN No ?Additional Comment Provided office hours. ?Disp. Time Disposition Final User ?04/02/2021 8:18:02 PM General Information Provided Yes [place], [place] ?Call Closed By: Gracy Bruins ?Transaction Date/Time: 04/02/2021 8:14:15 PM (ET ? ? ?Appt cancelled per pt request. ?

## 2021-05-29 ENCOUNTER — Encounter: Payer: Self-pay | Admitting: Family Medicine

## 2021-05-29 NOTE — Telephone Encounter (Signed)
Left a message on voicemail for patient to call the office back. Need to get more information. ?

## 2021-06-05 ENCOUNTER — Other Ambulatory Visit: Payer: Self-pay | Admitting: Family Medicine

## 2021-06-06 MED ORDER — ALLOPURINOL 100 MG PO TABS: 100.0000 mg | ORAL_TABLET | Freq: Every day | ORAL | 0 refills | Status: DC

## 2021-06-06 MED ORDER — LOSARTAN POTASSIUM 50 MG PO TABS: 50.0000 mg | ORAL_TABLET | Freq: Every day | ORAL | 0 refills | Status: DC

## 2021-06-06 MED ORDER — METOPROLOL SUCCINATE ER 25 MG PO TB24: 12.5000 mg | ORAL_TABLET | Freq: Every day | ORAL | 0 refills | Status: DC

## 2021-06-07 ENCOUNTER — Telehealth (INDEPENDENT_AMBULATORY_CARE_PROVIDER_SITE_OTHER): Payer: BC Managed Care – PPO | Admitting: Family Medicine

## 2021-06-07 ENCOUNTER — Encounter: Payer: Self-pay | Admitting: Family Medicine

## 2021-06-07 VITALS — Temp 99.2°F | Ht 70.0 in | Wt 250.0 lb

## 2021-06-07 DIAGNOSIS — F3289 Other specified depressive episodes: Secondary | ICD-10-CM | POA: Diagnosis not present

## 2021-06-07 DIAGNOSIS — U071 COVID-19: Secondary | ICD-10-CM

## 2021-06-07 NOTE — Progress Notes (Signed)
? ? Patient ID: Daniel May, male    DOB: 08-11-1965, 56 y.o.   MRN: [id number] ? ?Virtual visit completed through MyChart, a video enabled telemedicine application. Due to national recommendations of social distancing due to COVID-19, a virtual visit is felt to be most appropriate for this patient at this time. Reviewed limitations, risks, security and privacy concerns of performing a virtual visit and the availability of in person appointments. I also reviewed that there may be a patient responsible charge related to this service. The patient agreed to proceed.  ? ?Patient location: home ?Provider location: [facility] at [facility], office ?Persons participating in this virtual visit: patient, provider  ? ?If any vitals were documented, they were collected by patient at home unless specified below.   ? ?Temp 99.2 ?F (37.3 ?C)   Ht '5\' 10"'$  (1.778 m)   Wt 250 lb (113.4 kg)   BMI 35.87 kg/m?   ? ?CC: COVID infection  ?Subjective:  ? ?HPI: ?Daniel May is a 56 y.o. male presenting on 06/07/2021 for Letter for School/Work (Requests note to return to work due being out for more than 3 days for 3rd bout with COVID. ) and Discuss Medication (Pt has stopped Wellbutrin and Paxil. ) ? ? ?First day of symptoms was 05/26/2021. Symptoms included headache, muscle aches, fatigue, sinus congestion with rhinorrhea chills, fever 101.8.  ?Pt sent MyChart message with positive COVID test result on 05/29/2021.  ?Treated at home with mucinex and allergy medication.  ? ?Depression - was previously on paxil '40mg'$  daily and wellbutrin SR '150mg'$  daily - stopped both medications 2 wks ago after slow taper. Feeling better off medications.  ? ?Inflammatory arthritis- has not yet f/u with rheum.  ? ?Had vacation already planned for this week.  ?   ? ?Relevant past medical, surgical, family and social history reviewed and updated as indicated. Interim medical history since our last visit reviewed. ?Allergies and medications reviewed and  updated. ?Outpatient Medications Prior to Visit  ?Medication Sig Dispense Refill  ? albuterol (VENTOLIN HFA) 108 (90 Base) MCG/ACT inhaler Inhale 2 puffs into the lungs every 6 (six) hours as needed for wheezing or shortness of breath. 54 g 1  ? allopurinol (ZYLOPRIM) 100 MG tablet Take 1 tablet (100 mg total) by mouth daily. 90 tablet 0  ? fluticasone (FLONASE) 50 MCG/ACT nasal spray Place 2 sprays into both nostrils daily. 48 g 1  ? losartan (COZAAR) 50 MG tablet Take 1 tablet (50 mg total) by mouth daily. 90 tablet 0  ? metoprolol succinate (TOPROL-XL) 25 MG 24 hr tablet Take 0.5 tablets (12.5 mg total) by mouth daily. 45 tablet 0  ? Multiple Vitamins-Minerals (MULTIVITAMIN ADULT PO) Take by mouth.    ? Omega-3 Fatty Acids (FISH OIL) 1200 MG CAPS Take 2 capsules by mouth daily.    ? vitamin C (ASCORBIC ACID) 500 MG tablet Take 500 mg by mouth daily.    ? buPROPion ER (WELLBUTRIN SR) 150 MG 12 hr tablet Take 1 tablet (150 mg total) by mouth daily. 90 tablet 3  ? ondansetron (ZOFRAN) 4 MG tablet Take 1 tablet (4 mg total) by mouth every 6 (six) hours as needed for nausea or vomiting. 20 tablet 0  ? PARoxetine (PAXIL) 40 MG tablet Take 1 tablet (40 mg total) by mouth daily. 90 tablet 0  ? ?No facility-administered medications prior to visit.  ?  ? ?Per HPI unless specifically indicated in ROS section below ?Review of Systems ?Objective:  ?Temp 99.2 ?F (  37.3 ?C)   Ht '5\' 10"'$  (1.778 m)   Wt 250 lb (113.4 kg)   BMI 35.87 kg/m?   ?Wt Readings from Last 3 Encounters:  ?06/07/21 250 lb (113.4 kg)  ?02/13/21 250 lb (113.4 kg)  ?01/11/21 250 lb (113.4 kg)  ?  ?  ? ?Physical exam: ?Gen: alert, NAD, not ill appearing ?Pulm: speaks in complete sentences without increased work of breathing ?Psych: normal mood, normal thought content  ? ?   ?Assessment & Plan:  ? ?Problem List Items Addressed This Visit   ? ? Depression  ?  Stable period off all antidepressants. ?Desires to stay off medication at this time which is reasonable.   ? ?  ?  ? COVID-19 virus infection - Primary  ?  Repeat COVID infection this month with corresponding symptoms.  ?Symptoms largely resolved.  ?Out of work since 05/26/2021, planned return to work 06/02/2021.  ?Requests return to work note which I will send to [place]. ?  ?  ?  ? ?No orders of the defined types were placed in this encounter. ? ?No orders of the defined types were placed in this encounter. ? ? ?I discussed the assessment and treatment plan with the patient. The patient was provided an opportunity to ask questions and all were answered. The patient agreed with the plan and demonstrated an understanding of the instructions. The patient was advised to call back or seek an in-person evaluation if the symptoms worsen or if the condition fails to improve as anticipated. ? ?Follow up plan: ?No follow-ups on file. ? ?Ria Bush, MD   ?

## 2021-06-07 NOTE — Assessment & Plan Note (Signed)
Repeat COVID infection this month with corresponding symptoms.  ?Symptoms largely resolved.  ?Out of work since 05/26/2021, planned return to work 06/02/2021.  ?Requests return to work note which I will send to [place]. ?

## 2021-06-07 NOTE — Assessment & Plan Note (Signed)
Stable period off all antidepressants. ?Desires to stay off medication at this time which is reasonable.  ?

## 2021-06-26 ENCOUNTER — Encounter: Payer: Self-pay | Admitting: Family Medicine

## 2021-07-03 ENCOUNTER — Telehealth: Payer: Self-pay

## 2021-07-03 NOTE — Telephone Encounter (Signed)
See mychart message. Pt will need to schedule OV or virtual visit for form completion. Thanks.  Appt scheduled for Wed.

## 2021-07-03 NOTE — Telephone Encounter (Signed)
Received [employer] short term disability forms for Daniel May.  I have made a copy and put in your inbox for completion and signing.  Patient needs these faxed back by 07/05/21.  When forms are complete, patient wants copy mailed out to him.

## 2021-07-03 NOTE — Telephone Encounter (Signed)
Spoke with pt scheduling OV on 07/05/21 at 12:00.

## 2021-07-05 ENCOUNTER — Encounter: Payer: Self-pay | Admitting: Family Medicine

## 2021-07-05 ENCOUNTER — Ambulatory Visit: Payer: BC Managed Care – PPO | Admitting: Family Medicine

## 2021-07-05 VITALS — BP 152/98 | HR 100 | Temp 97.7°F | Ht 70.0 in | Wt 255.5 lb

## 2021-07-05 DIAGNOSIS — M1990 Unspecified osteoarthritis, unspecified site: Secondary | ICD-10-CM

## 2021-07-05 DIAGNOSIS — R252 Cramp and spasm: Secondary | ICD-10-CM | POA: Diagnosis not present

## 2021-07-05 DIAGNOSIS — R519 Headache, unspecified: Secondary | ICD-10-CM | POA: Diagnosis not present

## 2021-07-05 DIAGNOSIS — I10 Essential (primary) hypertension: Secondary | ICD-10-CM

## 2021-07-05 DIAGNOSIS — R5382 Chronic fatigue, unspecified: Secondary | ICD-10-CM

## 2021-07-05 DIAGNOSIS — J321 Chronic frontal sinusitis: Secondary | ICD-10-CM

## 2021-07-05 DIAGNOSIS — U071 COVID-19: Secondary | ICD-10-CM

## 2021-07-05 DIAGNOSIS — K089 Disorder of teeth and supporting structures, unspecified: Secondary | ICD-10-CM

## 2021-07-05 DIAGNOSIS — Z0279 Encounter for issue of other medical certificate: Secondary | ICD-10-CM

## 2021-07-05 DIAGNOSIS — M1A09X0 Idiopathic chronic gout, multiple sites, without tophus (tophi): Secondary | ICD-10-CM

## 2021-07-05 DIAGNOSIS — F17200 Nicotine dependence, unspecified, uncomplicated: Secondary | ICD-10-CM

## 2021-07-05 LAB — CBC WITH DIFFERENTIAL/PLATELET
Basophils Absolute: 0.1 10*3/uL (ref 0.0–0.1)
Basophils Relative: 0.7 % (ref 0.0–3.0)
Eosinophils Absolute: 0.2 10*3/uL (ref 0.0–0.7)
Eosinophils Relative: 3 % (ref 0.0–5.0)
HCT: 47.3 % (ref 39.0–52.0)
Hemoglobin: 16.3 g/dL (ref 13.0–17.0)
Lymphocytes Relative: 25.3 % (ref 12.0–46.0)
Lymphs Abs: 2 10*3/uL (ref 0.7–4.0)
MCHC: 34.5 g/dL (ref 30.0–36.0)
MCV: 92.5 fl (ref 78.0–100.0)
Monocytes Absolute: 0.8 10*3/uL (ref 0.1–1.0)
Monocytes Relative: 10.1 % (ref 3.0–12.0)
Neutro Abs: 4.8 10*3/uL (ref 1.4–7.7)
Neutrophils Relative %: 60.9 % (ref 43.0–77.0)
Platelets: 229 10*3/uL (ref 150.0–400.0)
RBC: 5.11 Mil/uL (ref 4.22–5.81)
RDW: 13.6 % (ref 11.5–15.5)
WBC: 7.9 10*3/uL (ref 4.0–10.5)

## 2021-07-05 LAB — COMPREHENSIVE METABOLIC PANEL
ALT: 70 U/L — ABNORMAL HIGH (ref 0–53)
AST: 43 U/L — ABNORMAL HIGH (ref 0–37)
Albumin: 4.4 g/dL (ref 3.5–5.2)
Alkaline Phosphatase: 72 U/L (ref 39–117)
BUN: 11 mg/dL (ref 6–23)
CO2: 28 mEq/L (ref 19–32)
Calcium: 9.4 mg/dL (ref 8.4–10.5)
Chloride: 103 mEq/L (ref 96–112)
Creatinine, Ser: 0.85 mg/dL (ref 0.40–1.50)
GFR: 98.58 mL/min (ref >60.00)
Glucose, Bld: 88 mg/dL (ref 70–99)
Potassium: 4.1 mEq/L (ref 3.5–5.1)
Sodium: 140 mEq/L (ref 135–145)
Total Bilirubin: 0.5 mg/dL (ref 0.2–1.2)
Total Protein: 7.2 g/dL (ref 6.0–8.3)

## 2021-07-05 LAB — URIC ACID: Uric Acid, Serum: 7.9 mg/dL — ABNORMAL HIGH (ref 4.0–7.8)

## 2021-07-05 LAB — CK: Total CK: 54 U/L (ref 7–232)

## 2021-07-05 LAB — SEDIMENTATION RATE: Sed Rate: 16 mm/hr (ref 0–20)

## 2021-07-05 MED ORDER — DOXYCYCLINE HYCLATE 100 MG PO TABS: 100.0000 mg | ORAL_TABLET | Freq: Two times a day (BID) | ORAL | 0 refills | Status: DC

## 2021-07-05 NOTE — Assessment & Plan Note (Signed)
3rd documented COVID infection 05/26/2021, with persisting symptoms.  See above.

## 2021-07-05 NOTE — Telephone Encounter (Signed)
Filled forms and in Daniel May's box.

## 2021-07-05 NOTE — Assessment & Plan Note (Signed)
H/o this, has not seen ENT. He will let me know when ready to seek ENT eval.

## 2021-07-05 NOTE — Assessment & Plan Note (Signed)
Discussed he needs to be more diligent on keeping me updated regarding his work status especially when he is out for longer than previously discussed. I will fill out STD claim form for patient. Plan is return to work full-time, full duty normal schedule Saturday, June 17 without restrictions.

## 2021-07-05 NOTE — Assessment & Plan Note (Signed)
Update urate levels on allopurinol '100mg'$  daily. ?contributing to chronic arthralgias.

## 2021-07-05 NOTE — Assessment & Plan Note (Signed)
Upcoming dental appt.

## 2021-07-05 NOTE — Progress Notes (Signed)
Patient ID: Daniel May, male    DOB: [birth year], 56 y.o.   MRN: [id number]  This visit was conducted in person.  BP (!) 152/98 (BP Location: Right Arm, Cuff Size: Large)   Pulse 100   Temp 97.7 F (36.5 C) (Temporal)   Ht '5\' 10"'$  (1.778 m)   Wt 255 lb 8 oz (115.9 kg)   SpO2 95%   BMI 36.66 kg/m    CC: post-COVID symptoms Subjective:   HPI: Daniel May is a 56 y.o. male presenting on 07/05/2021 for Headache (C/o HA, body aches and fatigue post COVID. 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 Symptoms included HA, myalgias, fatigue, sinus congestion with rhinorrhea and chills and fever Tmax 101.8. virtual visit done by our office 06/07/2021, outside of window for antiviral at that time. Plan at that time was return to work Friday 06/09/2021. F/u MyChart message 06/26/2021 pt notified me he had stayed out of work due to recurrence of sickness including muscle aches, cramping, fatigue, headache and dyspnea.   Describes ongoing frontal sinus dull throbbing headache for several weeks now associated with maxillary sinus pressure. Notes mild congestion and PNdrainage. Bilateral tinnitus which is chronic but recently worse as well.  No fevers/chills, ear or tooth pain, ST.   Continued smoker 1/2-3/4 ppd, set quit date for this Sunday.   Also notes 1 month of worsening muscle cramping to R side with radiation into chest, more when sitting, better with standing and stretching. Staying well hydrated.   Inflammatory arthritis - overdue for rheum f/u.   HTN - missed last 2 days of BP meds. Normally regularly takes toprol XL and losartan. Doesn't check BP at home. No vision changes, CP/tightness, SOB, leg swelling.   Upcoming dentist and eye exam.      Relevant past medical, surgical, family and social history reviewed and updated as indicated. Interim medical history since our last visit reviewed. Allergies and medications reviewed and updated. Outpatient Medications Prior to Visit  Medication  Sig Dispense Refill   albuterol (VENTOLIN HFA) 108 (90 Base) MCG/ACT inhaler Inhale 2 puffs into the lungs every 6 (six) hours as needed for wheezing or shortness of breath. 54 g 1   allopurinol (ZYLOPRIM) 100 MG tablet Take 1 tablet (100 mg total) by mouth daily. 90 tablet 0   fluticasone (FLONASE) 50 MCG/ACT nasal spray Place 2 sprays into both nostrils daily. 48 g 1   losartan (COZAAR) 50 MG tablet Take 1 tablet (50 mg total) by mouth daily. 90 tablet 0   metoprolol succinate (TOPROL-XL) 25 MG 24 hr tablet Take 0.5 tablets (12.5 mg total) by mouth daily. 45 tablet 0   Multiple Vitamins-Minerals (MULTIVITAMIN ADULT PO) Take by mouth.     Omega-3 Fatty Acids (FISH OIL) 1200 MG CAPS Take 2 capsules by mouth daily.     vitamin C (ASCORBIC ACID) 500 MG tablet Take 500 mg by mouth daily.     No facility-administered medications prior to visit.     Per HPI unless specifically indicated in ROS section below Review of Systems  Objective:  BP (!) 152/98 (BP Location: Right Arm, Cuff Size: Large)   Pulse 100   Temp 97.7 F (36.5 C) (Temporal)   Ht '5\' 10"'$  (1.778 m)   Wt 255 lb 8 oz (115.9 kg)   SpO2 95%   BMI 36.66 kg/m   Wt Readings from Last 3 Encounters:  07/05/21 255 lb 8 oz (115.9 kg)  06/07/21 250 lb (113.4 kg)  02/13/21 250 lb (113.4 kg)      Physical Exam Vitals and nursing note reviewed.  Constitutional:      Appearance: Normal appearance. He is not ill-appearing.  HENT:     Head: Normocephalic and atraumatic.     Right Ear: Hearing, tympanic membrane, ear canal and external ear normal. There is no impacted cerumen.     Left Ear: Hearing, tympanic membrane, ear canal and external ear normal. There is no impacted cerumen.     Nose: Congestion present. No mucosal edema or rhinorrhea.     Right Turbinates: Pale. Not enlarged or swollen.     Left Turbinates: Not enlarged, swollen or pale.     Right Sinus: No maxillary sinus tenderness or frontal sinus tenderness.     Left  Sinus: No maxillary sinus tenderness or frontal sinus tenderness.     Mouth/Throat:     Mouth: Mucous membranes are moist.     Pharynx: Oropharynx is clear. No oropharyngeal exudate or posterior oropharyngeal erythema.  Eyes:     Extraocular Movements: Extraocular movements intact.     Conjunctiva/sclera: Conjunctivae normal.     Pupils: Pupils are equal, round, and reactive to light.  Cardiovascular:     Rate and Rhythm: Normal rate and regular rhythm.     Pulses: Normal pulses.     Heart sounds: Normal heart sounds. No murmur heard. Pulmonary:     Effort: Pulmonary effort is normal. No respiratory distress.     Breath sounds: Normal breath sounds. No wheezing, rhonchi or rales.  Musculoskeletal:     Cervical back: Normal range of motion and neck supple. No rigidity.     Right lower leg: No edema.     Left lower leg: No edema.  Lymphadenopathy:     Cervical: No cervical adenopathy.  Skin:    General: Skin is warm and dry.     Findings: No rash.  Neurological:     Mental Status: He is alert.  Psychiatric:        Mood and Affect: Mood normal.        Behavior: Behavior normal.        Assessment & Plan:   Problem List Items Addressed This Visit     HTN (hypertension)    BP elevated today in setting of missed doses - ran out of antihypertensives 2d ago and hasn't refilled yet. Advised pick up BP meds at pharmacy and take today.       Smoker    Continued smoker 1/2-3/4 ppd. Reviewed contribution of smoking to lingering respiratory symptoms.       Gout    Update urate levels on allopurinol '100mg'$  daily. ?contributing to chronic arthralgias.       Relevant Orders   Uric acid   Poor dentition    Upcoming dental appt.       Chronic sinusitis    H/o this, has not seen ENT. He will let me know when ready to seek ENT eval.       Relevant Medications   doxycycline (VIBRA-TABS) 100 MG tablet   Chronic fatigue   Nonintractable episodic headache - Primary    Recurrence  in chronic headache since latest COVID infection 05/2021.  With component of sinusitis (frontal headache, maxillary pressure) in h/o same, rec start flonase and will Rx 10d doxycycline course (with photosensitivity precautions). Will update STD paperwork.       Relevant Orders   Comprehensive metabolic panel   CBC with Differential/Platelet   Sedimentation rate  Inflammatory arthritis   COVID-19 virus infection    3rd documented COVID infection 05/26/2021, with persisting symptoms.  See above.       Issue of medical certificate    Discussed he needs to be more diligent on keeping me updated regarding his work status especially when he is out for longer than previously discussed. I will fill out STD claim form for patient. Plan is return to work full-time, full duty normal schedule Saturday, June 17 without restrictions.      Other Visit Diagnoses     Cramp in muscle       Relevant Orders   CK        Meds ordered this encounter  Medications   doxycycline (VIBRA-TABS) 100 MG tablet    Sig: Take 1 tablet (100 mg total) by mouth 2 (two) times daily.    Dispense:  20 tablet    Refill:  0   Orders Placed This Encounter  Procedures   Comprehensive metabolic panel   CBC with Differential/Platelet   Sedimentation rate   CK   Uric acid     Patient Instructions  Go ahead and restart blood pressure medications.  Restart flonase.  Take doxycycline antibiotic course.  Let me know if interested in ENT especially if ongoing headaches.  Return to work on Saturday full duty, normal schedule.   Follow up plan: Return if symptoms worsen or fail to improve.  Ria Bush, MD

## 2021-07-05 NOTE — Telephone Encounter (Signed)
FMLA forms have been faxed to [employer]# [phone].  Copies have been made for myself, scanning, and patient.  Copy for patient has been mailed out per his request.

## 2021-07-05 NOTE — Assessment & Plan Note (Signed)
BP elevated today in setting of missed doses - ran out of antihypertensives 2d ago and hasn't refilled yet. Advised pick up BP meds at pharmacy and take today.

## 2021-07-05 NOTE — Patient Instructions (Addendum)
Go ahead and restart blood pressure medications.  Restart flonase.  Take doxycycline antibiotic course.  Let me know if interested in ENT especially if ongoing headaches.  Return to work on Saturday full duty, normal schedule.

## 2021-07-05 NOTE — Assessment & Plan Note (Signed)
Continued smoker 1/2-3/4 ppd. Reviewed contribution of smoking to lingering respiratory symptoms.

## 2021-07-05 NOTE — Assessment & Plan Note (Signed)
Recurrence in chronic headache since latest COVID infection 05/2021.  With component of sinusitis (frontal headache, maxillary pressure) in h/o same, rec start flonase and will Rx 10d doxycycline course (with photosensitivity precautions). Will update STD paperwork.

## 2021-07-06 ENCOUNTER — Other Ambulatory Visit: Payer: Self-pay | Admitting: Family Medicine

## 2021-07-06 MED ORDER — ALLOPURINOL 100 MG PO TABS
200.0000 mg | ORAL_TABLET | Freq: Every day | ORAL | 3 refills | Status: DC
Start: 2021-07-06 — End: 2021-08-22

## 2021-08-22 ENCOUNTER — Ambulatory Visit (INDEPENDENT_AMBULATORY_CARE_PROVIDER_SITE_OTHER): Payer: BC Managed Care – PPO | Admitting: Family Medicine

## 2021-08-22 ENCOUNTER — Encounter: Payer: Self-pay | Admitting: Family Medicine

## 2021-08-22 VITALS — BP 140/90 | HR 91 | Temp 97.7°F | Ht 69.5 in | Wt 243.4 lb

## 2021-08-22 DIAGNOSIS — Z0000 Encounter for general adult medical examination without abnormal findings: Secondary | ICD-10-CM

## 2021-08-22 DIAGNOSIS — E785 Hyperlipidemia, unspecified: Secondary | ICD-10-CM | POA: Diagnosis not present

## 2021-08-22 DIAGNOSIS — K089 Disorder of teeth and supporting structures, unspecified: Secondary | ICD-10-CM

## 2021-08-22 DIAGNOSIS — F17200 Nicotine dependence, unspecified, uncomplicated: Secondary | ICD-10-CM

## 2021-08-22 DIAGNOSIS — I10 Essential (primary) hypertension: Secondary | ICD-10-CM

## 2021-08-22 DIAGNOSIS — Z23 Encounter for immunization: Secondary | ICD-10-CM | POA: Diagnosis not present

## 2021-08-22 DIAGNOSIS — Z125 Encounter for screening for malignant neoplasm of prostate: Secondary | ICD-10-CM

## 2021-08-22 DIAGNOSIS — R7401 Elevation of levels of liver transaminase levels: Secondary | ICD-10-CM

## 2021-08-22 DIAGNOSIS — M1A09X0 Idiopathic chronic gout, multiple sites, without tophus (tophi): Secondary | ICD-10-CM

## 2021-08-22 DIAGNOSIS — R5382 Chronic fatigue, unspecified: Secondary | ICD-10-CM | POA: Diagnosis not present

## 2021-08-22 DIAGNOSIS — F3289 Other specified depressive episodes: Secondary | ICD-10-CM

## 2021-08-22 LAB — COMPREHENSIVE METABOLIC PANEL
ALT: 25 U/L (ref 0–53)
AST: 19 U/L (ref 0–37)
Albumin: 4.4 g/dL (ref 3.5–5.2)
Alkaline Phosphatase: 70 U/L (ref 39–117)
BUN: 10 mg/dL (ref 6–23)
CO2: 25 mEq/L (ref 19–32)
Calcium: 9 mg/dL (ref 8.4–10.5)
Chloride: 105 mEq/L (ref 96–112)
Creatinine, Ser: 0.83 mg/dL (ref 0.40–1.50)
GFR: 99.2 mL/min (ref >60.00)
Glucose, Bld: 105 mg/dL — ABNORMAL HIGH (ref 70–99)
Potassium: 4.2 mEq/L (ref 3.5–5.1)
Sodium: 138 mEq/L (ref 135–145)
Total Bilirubin: 0.4 mg/dL (ref 0.2–1.2)
Total Protein: 7 g/dL (ref 6.0–8.3)

## 2021-08-22 LAB — LIPID PANEL
Cholesterol: 221 mg/dL — ABNORMAL HIGH (ref 0–200)
HDL: 34.9 mg/dL — ABNORMAL LOW
LDL Cholesterol: 156 mg/dL — ABNORMAL HIGH (ref 0–99)
NonHDL: 185.69
Total CHOL/HDL Ratio: 6
Triglycerides: 147 mg/dL (ref 0.0–149.0)
VLDL: 29.4 mg/dL (ref 0.0–40.0)

## 2021-08-22 LAB — PSA: PSA: 0.81 ng/mL (ref 0.10–4.00)

## 2021-08-22 LAB — FERRITIN: Ferritin: 224.2 ng/mL (ref 22.0–322.0)

## 2021-08-22 LAB — URIC ACID: Uric Acid, Serum: 5.5 mg/dL (ref 4.0–7.8)

## 2021-08-22 LAB — TSH: TSH: 1.61 u[IU]/mL (ref 0.35–5.50)

## 2021-08-22 MED ORDER — LOSARTAN POTASSIUM 50 MG PO TABS: 50.0000 mg | ORAL_TABLET | Freq: Every day | ORAL | 3 refills | Status: DC

## 2021-08-22 MED ORDER — METOPROLOL SUCCINATE ER 25 MG PO TB24
25.0000 mg | ORAL_TABLET | Freq: Every day | ORAL | 3 refills | Status: DC
Start: 2021-08-22 — End: 2022-09-05

## 2021-08-22 MED ORDER — ALLOPURINOL 100 MG PO TABS: 200.0000 mg | ORAL_TABLET | Freq: Every day | ORAL | 3 refills | Status: DC

## 2021-08-22 NOTE — Assessment & Plan Note (Addendum)
Down to 1/2 ppd.  Refer to lung cancer screening program.

## 2021-08-22 NOTE — Assessment & Plan Note (Signed)
Preventative protocols reviewed and updated unless pt declined. Discussed healthy diet and lifestyle.  

## 2021-08-22 NOTE — Patient Instructions (Addendum)
Labs today  We will refer you to lung cancer screening program.  Shingrix vaccine today. Schedule nurse visit in 2-6 months to complete 2nd shot.  Good to see you today Return as needed or in 1 year for next physical.   Health Maintenance, Male Adopting a healthy lifestyle and getting preventive care are important in promoting health and wellness. Ask your health care provider about: The right schedule for you to have regular tests and exams. Things you can do on your own to prevent diseases and keep yourself healthy. What should I know about diet, weight, and exercise? Eat a healthy diet  Eat a diet that includes plenty of vegetables, fruits, low-fat dairy products, and lean protein. Do not eat a lot of foods that are high in solid fats, added sugars, or sodium. Maintain a healthy weight Body mass index (BMI) is a measurement that can be used to identify possible weight problems. It estimates body fat based on height and weight. Your health care provider can help determine your BMI and help you achieve or maintain a healthy weight. Get regular exercise Get regular exercise. This is one of the most important things you can do for your health. Most adults should: Exercise for at least 150 minutes each week. The exercise should increase your heart rate and make you sweat (moderate-intensity exercise). Do strengthening exercises at least twice a week. This is in addition to the moderate-intensity exercise. Spend less time sitting. Even light physical activity can be beneficial. Watch cholesterol and blood lipids Have your blood tested for lipids and cholesterol at 54 years of age, then have this test every 5 years. You may need to have your cholesterol levels checked more often if: Your lipid or cholesterol levels are high. You are older than 54 years of age. You are at high risk for heart disease. What should I know about cancer screening? Many types of cancers can be detected early and  may often be prevented. Depending on your health history and family history, you may need to have cancer screening at various ages. This may include screening for: Colorectal cancer. Prostate cancer. Skin cancer. Lung cancer. What should I know about heart disease, diabetes, and high blood pressure? Blood pressure and heart disease High blood pressure causes heart disease and increases the risk of stroke. This is more likely to develop in people who have high blood pressure readings or are overweight. Talk with your health care provider about your target blood pressure readings. Have your blood pressure checked: Every 3-5 years if you are 70-40 years of age. Every year if you are 54 years old or older. If you are between the ages of 21 and 84 and are a current or former smoker, ask your health care provider if you should have a one-time screening for abdominal aortic aneurysm (AAA). Diabetes Have regular diabetes screenings. This checks your fasting blood sugar level. Have the screening done: Once every three years after age 14 if you are at a normal weight and have a low risk for diabetes. More often and at a younger age if you are overweight or have a high risk for diabetes. What should I know about preventing infection? Hepatitis B If you have a higher risk for hepatitis B, you should be screened for this virus. Talk with your health care provider to find out if you are at risk for hepatitis B infection. Hepatitis C Blood testing is recommended for: Everyone born from [birth year] through 1965. Anyone with known  risk factors for hepatitis C. Sexually transmitted infections (STIs) You should be screened each year for STIs, including gonorrhea and chlamydia, if: You are sexually active and are younger than 54 years of age. You are older than 54 years of age and your health care provider tells you that you are at risk for this type of infection. Your sexual activity has changed since you were  last screened, and you are at increased risk for chlamydia or gonorrhea. Ask your health care provider if you are at risk. Ask your health care provider about whether you are at high risk for HIV. Your health care provider may recommend a prescription medicine to help prevent HIV infection. If you choose to take medicine to prevent HIV, you should first get tested for HIV. You should then be tested every 3 months for as long as you are taking the medicine. Follow these instructions at home: Alcohol use Do not drink alcohol if your health care provider tells you not to drink. If you drink alcohol: Limit how much you have to 0-2 drinks a day. Know how much alcohol is in your drink. In the [place] one drink equals one 12 oz bottle of beer (355 mL), one 5 oz glass of wine (148 mL), or one 1 oz glass of hard liquor (44 mL). Lifestyle Do not use any products that contain nicotine or tobacco. These products include cigarettes, chewing tobacco, and vaping devices, such as e-cigarettes. If you need help quitting, ask your health care provider. Do not use street drugs. Do not share needles. Ask your health care provider for help if you need support or information about quitting drugs. General instructions Schedule regular health, dental, and eye exams. Stay current with your vaccines. Tell your health care provider if: You often feel depressed. You have ever been abused or do not feel safe at home. Summary Adopting a healthy lifestyle and getting preventive care are important in promoting health and wellness. Follow your health care provider's instructions about healthy diet, exercising, and getting tested or screened for diseases. Follow your health care provider's instructions on monitoring your cholesterol and blood pressure. This information is not intended to replace advice given to you by your health care provider. Make sure you discuss any questions you have with your health care  provider. Document Revised: 05/30/2020 Document Reviewed: 05/30/2020 Elsevier Patient Education  [employer].

## 2021-08-22 NOTE — Progress Notes (Signed)
Patient ID: Daniel May, male    DOB: 1966/01/11, 56 y.o.   MRN: [id number]  This visit was conducted in person.  BP (!) 140/90   Pulse 91   Temp 97.7 F (36.5 C) (Temporal)   Ht 5' 9.5" (1.765 m)   Wt 243 lb 6 oz (110.4 kg)   SpO2 97%   BMI 35.42 kg/m   138/94 on retesting   CC: CPE Subjective:   HPI: Daniel May is a 56 y.o. male presenting on 08/22/2021 for Annual Exam   Back to work - considering switching jobs and moving to [facility] to be closer to daughter's family.   Continues taking losartan '50mg'$  and toprol XL 12.'5mg'$  daily.  Continues allopurinol '200mg'$  daily without recent gout flares.  Continued smoker 1/2 ppd, continues cutting down. Has vape as well.   Fasting today.   Preventative: COLONOSCOPY 05/2017 - 3 TA, mild diverticulosis, int hem, rpt 5 yrs [facility] Plan) Prostate screening - yearly PSA. No fmhx prostate cancer. Nocturia x1.  Lung cancer screening - 30+ PY hx. Interested - referral placed Flu shot - doesn't get  COVID vaccine - did not get Tetanus - 2016 Shingrix - discussed. Had complicated case of chicken pox with PNA at age 25 yo.  Seat belt use discussed.  Sunscreen use discussed. No changing moles.  Smoking - ongoing, 1/2 ppd. In action phase.  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 Interim medical history since our last visit reviewed. Allergies and medications reviewed and updated. Outpatient Medications Prior to Visit  Medication Sig Dispense Refill   albuterol (VENTOLIN HFA) 108 (90 Base) MCG/ACT inhaler Inhale 2 puffs into the lungs every 6 (six)  hours as needed for wheezing or shortness of breath. 54 g 1   fluticasone (FLONASE) 50 MCG/ACT nasal spray Place 2 sprays into both nostrils daily. 48 g 1   Multiple Vitamins-Minerals (MULTIVITAMIN ADULT PO) Take by mouth.     Omega-3 Fatty Acids (FISH OIL) 1200 MG CAPS Take 2 capsules by mouth daily.     vitamin C (ASCORBIC ACID) 500 MG tablet Take 500 mg by mouth daily.     allopurinol (ZYLOPRIM) 100 MG tablet Take 2 tablets (200 mg total) by mouth daily. 180 tablet 3   losartan (COZAAR) 50 MG tablet Take 1 tablet (50 mg total) by mouth daily. 90 tablet 0   metoprolol succinate (TOPROL-XL) 25 MG 24 hr tablet Take 0.5 tablets (12.5 mg total) by mouth daily. 45 tablet 0   doxycycline (VIBRA-TABS) 100 MG tablet Take 1 tablet (100 mg total) by mouth 2 (two) times daily. 20 tablet 0   No facility-administered medications prior to visit.     Per HPI unless specifically indicated in ROS section below Review of Systems  Constitutional:  Negative for activity change, appetite change, chills, fatigue, fever and unexpected weight change.  HENT:  Negative for hearing loss.   Eyes:  Negative for visual disturbance.  Respiratory:  Negative for cough, chest tightness, shortness of breath and wheezing.   Cardiovascular:  Negative for chest pain, palpitations and leg swelling.  Gastrointestinal:  Negative for abdominal distention, abdominal pain, blood in stool, constipation, diarrhea, nausea and vomiting.  Genitourinary:  Negative for difficulty urinating and hematuria.  Musculoskeletal:  Negative for arthralgias, myalgias and neck pain.  Skin:  Negative for rash.  Neurological:  Negative for dizziness, seizures, syncope and headaches.  Hematological:  Negative for adenopathy. Does not bruise/bleed easily.  Psychiatric/Behavioral:  Negative for dysphoric mood. The patient is not nervous/anxious.     Objective:  BP (!) 140/90   Pulse 91   Temp 97.7 F (36.5 C) (Temporal)   Ht 5' 9.5" (1.765 m)    Wt 243 lb 6 oz (110.4 kg)   SpO2 97%   BMI 35.42 kg/m   Wt Readings from Last 3 Encounters:  08/22/21 243 lb 6 oz (110.4 kg)  07/05/21 255 lb 8 oz (115.9 kg)  06/07/21 250 lb (113.4 kg)      Physical Exam Vitals and nursing note reviewed.  Constitutional:      General: He is not in acute distress.    Appearance: Normal appearance. He is well-developed. He is not ill-appearing.  HENT:     Head: Normocephalic and atraumatic.     Right Ear: Hearing, tympanic membrane, ear canal and external ear normal.     Left Ear: Hearing, tympanic membrane, ear canal and external ear normal.  Eyes:     General: No scleral icterus.    Extraocular Movements: Extraocular movements intact.     Conjunctiva/sclera: Conjunctivae normal.     Pupils: Pupils are equal, round, and reactive to light.  Neck:     Thyroid: No thyroid mass or thyromegaly.  Cardiovascular:     Rate and Rhythm: Normal rate and regular rhythm.     Pulses: Normal pulses.          Radial pulses are 2+ on the right side and 2+ on the left side.     Heart sounds: Normal heart sounds. No murmur heard. Pulmonary:     Effort: Pulmonary effort is normal. No respiratory distress.     Breath sounds: Normal breath sounds. No wheezing, rhonchi or rales.  Abdominal:     General: Bowel sounds are normal. There is no distension.     Palpations: Abdomen is soft. There is no mass.     Tenderness: There is no abdominal tenderness. There is no guarding or rebound.     Hernia: No hernia is present.  Musculoskeletal:        General: Normal range of motion.     Cervical back: Normal range of motion and neck supple.     Right lower leg: No edema.     Left lower leg: No edema.  Lymphadenopathy:     Cervical: No cervical adenopathy.  Skin:    General: Skin is warm and dry.     Findings: No rash.  Neurological:     General: No focal deficit present.     Mental Status: He is alert and oriented to person, place, and time.  Psychiatric:         Mood and Affect: Mood normal.        Behavior: Behavior normal.        Thought Content: Thought content normal.        Judgment: Judgment normal.       Results for orders placed or performed in visit on 07/05/21  Comprehensive metabolic panel  Result Value Ref Range   Sodium 140 135 - 145 mEq/L   Potassium 4.1 3.5 -  5.1 mEq/L   Chloride 103 96 - 112 mEq/L   CO2 28 19 - 32 mEq/L   Glucose, Bld 88 70 - 99 mg/dL   BUN 11 6 - 23 mg/dL   Creatinine, Ser 0.85 0.40 - 1.50 mg/dL   Total Bilirubin 0.5 0.2 - 1.2 mg/dL   Alkaline Phosphatase 72 39 - 117 U/L   AST 43 (H) 0 - 37 U/L   ALT 70 (H) 0 - 53 U/L   Total Protein 7.2 6.0 - 8.3 g/dL   Albumin 4.4 3.5 - 5.2 g/dL   GFR 98.58 >60.00 mL/min   Calcium 9.4 8.4 - 10.5 mg/dL  CBC with Differential/Platelet  Result Value Ref Range   WBC 7.9 4.0 - 10.5 K/uL   RBC 5.11 4.22 - 5.81 Mil/uL   Hemoglobin 16.3 13.0 - 17.0 g/dL   HCT 47.3 39.0 - 52.0 %   MCV 92.5 78.0 - 100.0 fl   MCHC 34.5 30.0 - 36.0 g/dL   RDW 13.6 11.5 - 15.5 %   Platelets 229.0 150.0 - 400.0 K/uL   Neutrophils Relative % 60.9 43.0 - 77.0 %   Lymphocytes Relative 25.3 12.0 - 46.0 %   Monocytes Relative 10.1 3.0 - 12.0 %   Eosinophils Relative 3.0 0.0 - 5.0 %   Basophils Relative 0.7 0.0 - 3.0 %   Neutro Abs 4.8 1.4 - 7.7 K/uL   Lymphs Abs 2.0 0.7 - 4.0 K/uL   Monocytes Absolute 0.8 0.1 - 1.0 K/uL   Eosinophils Absolute 0.2 0.0 - 0.7 K/uL   Basophils Absolute 0.1 0.0 - 0.1 K/uL  Sedimentation rate  Result Value Ref Range   Sed Rate 16 0 - 20 mm/hr  CK  Result Value Ref Range   Total CK 54 7 - 232 U/L  Uric acid  Result Value Ref Range   Uric Acid, Serum 7.9 (H) 4.0 - 7.8 mg/dL   Lab Results  Component Value Date   PSA 0.97 05/25/2019     Lab Results  Component Value Date   TSH 1.41 05/25/2019    Assessment & Plan:   Problem List Items Addressed This Visit     Health maintenance examination - Primary (Chronic)    Preventative protocols reviewed and  updated unless pt declined. Discussed healthy diet and lifestyle.       Depression    Stable period off medication.       HTN (hypertension)    Chronic, mildly elevated despite current regimen - will increase metoprolol XL to '25mg'$  daily.       Relevant Medications   losartan (COZAAR) 50 MG tablet   metoprolol succinate (TOPROL-XL) 25 MG 24 hr tablet   Dyslipidemia    Chronic only on fish oil. Update FLP.  The 10-year ASCVD risk score (Arnett DK, et al., 2019) is: 18.1%   Values used to calculate the score:     Age: 60 years     Sex: Male     Is Non-Hispanic African American: No     Diabetic: No     Tobacco smoker: Yes     Systolic Blood Pressure: 811 mmHg     Is BP treated: Yes     HDL Cholesterol: 41.7 mg/dL     Total Cholesterol: 230 mg/dL       Relevant Orders   Lipid panel   Comprehensive metabolic panel   TSH   Smoker    Down to 1/2 ppd.  Refer to lung cancer screening program.  Relevant Orders   Ambulatory Referral for Lung Cancer Scre   Transaminitis    Anticipate related to fatty liver, update LFTs with recent weight loss anticipate better control.       Relevant Orders   Comprehensive metabolic panel   Gout    Update urate on higher allopurinol dose ('200mg'$  daily).       Relevant Orders   Uric acid   Poor dentition    Currently undergoing dental treatment.       Chronic fatigue   Other Visit Diagnoses     Special screening for malignant neoplasm of prostate       Relevant Orders   PSA   Need for shingles vaccine       Relevant Orders   Varicella-zoster vaccine IM (Completed)        Meds ordered this encounter  Medications   losartan (COZAAR) 50 MG tablet    Sig: Take 1 tablet (50 mg total) by mouth daily.    Dispense:  90 tablet    Refill:  3   metoprolol succinate (TOPROL-XL) 25 MG 24 hr tablet    Sig: Take 1 tablet (25 mg total) by mouth daily.    Dispense:  90 tablet    Refill:  3    Note new dose   allopurinol  (ZYLOPRIM) 100 MG tablet    Sig: Take 2 tablets (200 mg total) by mouth daily.    Dispense:  180 tablet    Refill:  3   Orders Placed This Encounter  Procedures   Varicella-zoster vaccine IM   Lipid panel   PSA   Comprehensive metabolic panel   TSH   Uric acid   Ambulatory Referral for Lung Cancer Scre    Referral Priority:   Routine    Referral Type:   Consultation    Referral Reason:   Specialty Services Required    Number of Visits Requested:   1    Patient instructions: Labs today  We will refer you to lung cancer screening program.  Shingrix vaccine today. Schedule nurse visit in 2-6 months to complete 2nd shot.  Good to see you today Return as needed or in 1 year for next physical.   Follow up plan: Return in about 1 year (around 08/23/2022) for annual exam, prior fasting for blood work.  Ria Bush, MD

## 2021-08-22 NOTE — Assessment & Plan Note (Signed)
Anticipate related to fatty liver, update LFTs with recent weight loss anticipate better control.

## 2021-08-22 NOTE — Assessment & Plan Note (Signed)
Currently undergoing dental treatment.

## 2021-08-22 NOTE — Assessment & Plan Note (Signed)
Stable period off medication.  

## 2021-08-22 NOTE — Assessment & Plan Note (Addendum)
Chronic, mildly elevated despite current regimen - will increase metoprolol XL to '25mg'$  daily.

## 2021-08-22 NOTE — Assessment & Plan Note (Signed)
Update urate on higher allopurinol dose ('200mg'$  daily).

## 2021-08-22 NOTE — Assessment & Plan Note (Signed)
Chronic only on fish oil. Update FLP.  The 10-year ASCVD risk score (Arnett DK, et al., 2019) is: 18.1%   Values used to calculate the score:     Age: 56 years     Sex: Male     Is Non-Hispanic African American: No     Diabetic: No     Tobacco smoker: Yes     Systolic Blood Pressure: 355 mmHg     Is BP treated: Yes     HDL Cholesterol: 41.7 mg/dL     Total Cholesterol: 230 mg/dL

## 2021-09-15 ENCOUNTER — Other Ambulatory Visit: Payer: BC Managed Care – PPO

## 2021-10-31 DIAGNOSIS — U071 COVID-19: Secondary | ICD-10-CM | POA: Diagnosis not present

## 2021-10-31 DIAGNOSIS — Z20822 Contact with and (suspected) exposure to covid-19: Secondary | ICD-10-CM | POA: Diagnosis not present

## 2021-12-12 ENCOUNTER — Encounter: Payer: Self-pay | Admitting: Family Medicine

## 2021-12-12 NOTE — Telephone Encounter (Signed)
Scheduled patient for a mychart video visit tomorrow with Catalina Antigua at 1200,as Dr Darnell Level didn't have any appointments available soon.

## 2021-12-12 NOTE — Telephone Encounter (Signed)
Sending note to Dr Darnell Level in case he has any other recommendations and/or able to do note without appt

## 2021-12-13 ENCOUNTER — Telehealth (INDEPENDENT_AMBULATORY_CARE_PROVIDER_SITE_OTHER): Payer: BC Managed Care – PPO | Admitting: Nurse Practitioner

## 2021-12-13 VITALS — Temp 98.0°F

## 2021-12-13 DIAGNOSIS — U071 COVID-19: Secondary | ICD-10-CM

## 2021-12-13 NOTE — Telephone Encounter (Signed)
Appreciate Matt seeing patient.

## 2021-12-13 NOTE — Progress Notes (Signed)
Patient ID: Daniel May, male    DOB: 1965/09/14, 56 y.o.   MRN: [id number]  Virtual visit completed through [place], a video enabled telemedicine application. Due to national recommendations of social distancing due to COVID-19, a virtual visit is felt to be most appropriate for this patient at this time. Reviewed limitations, risks, security and privacy concerns of performing a virtual visit and the availability of in person appointments. I also reviewed that there may be a patient responsible charge related to this service. The patient agreed to proceed.   Patient location: home Provider location: [place] at [facility], office Persons participating in this virtual visit: patient, provider   If any vitals were documented, they were collected by patient at home unless specified below.    Temp 98 F (36.7 C) Comment: per patient   CC: Covid 19 follow up  Subjective:   HPI: Daniel May is a 56 y.o. male presenting on 12/13/2021 for Covid Positive (On 12/07/21, sx started on 12/07/21-needs work note to be released. Feels better. Has a little bit congestion, headache and some fatigue present. )    Patient was covid positive on 12/07/2021 and symptoms started on 12/07/2021 No coivd vaccines on file States that he works at [employer] and if he misses 3 days or more they need a return to work to note.   Just did over the counter stuff.  States that this covid case was mild. States that he has had covid several times in the past.  He was having cough congestion, sore throat and headache. States that by Saturday he was feeling better with over-the-counter treatments.     Relevant past medical, surgical, family and social history reviewed and updated as indicated. Interim medical history since our last visit reviewed. Allergies and medications reviewed and updated. Outpatient Medications Prior to Visit  Medication Sig Dispense Refill   albuterol (VENTOLIN HFA) 108 (90 Base) MCG/ACT  inhaler Inhale 2 puffs into the lungs every 6 (six) hours as needed for wheezing or shortness of breath. 54 g 1   allopurinol (ZYLOPRIM) 100 MG tablet Take 2 tablets (200 mg total) by mouth daily. 180 tablet 3   fluticasone (FLONASE) 50 MCG/ACT nasal spray Place 2 sprays into both nostrils daily. 48 g 1   losartan (COZAAR) 50 MG tablet Take 1 tablet (50 mg total) by mouth daily. 90 tablet 3   metoprolol succinate (TOPROL-XL) 25 MG 24 hr tablet Take 1 tablet (25 mg total) by mouth daily. 90 tablet 3   Multiple Vitamins-Minerals (MULTIVITAMIN ADULT PO) Take by mouth.     Omega-3 Fatty Acids (FISH OIL) 1200 MG CAPS Take 2 capsules by mouth daily.     vitamin C (ASCORBIC ACID) 500 MG tablet Take 500 mg by mouth daily.     No facility-administered medications prior to visit.     Per HPI unless specifically indicated in ROS section below Review of Systems  Constitutional:  Positive for fatigue. Negative for chills and fever.  HENT:  Positive for congestion. Negative for sore throat.   Respiratory:  Positive for cough. Negative for shortness of breath.   Cardiovascular:  Negative for chest pain.  Gastrointestinal:  Negative for constipation, diarrhea and nausea.  Musculoskeletal:  Negative for arthralgias and myalgias.  Neurological:  Positive for headaches (intermittent).   Objective:  Temp 98 F (36.7 C) Comment: per patient  Wt Readings from Last 3 Encounters:  08/22/21 243 lb 6 oz (110.4 kg)  07/05/21 255 lb 8 oz (115.9  kg)  06/07/21 250 lb (113.4 kg)       Physical exam: Gen: alert, NAD, not ill appearing Pulm: speaks in complete sentences without increased work of breathing Psych: normal mood, normal thought content      Results for orders placed or performed in visit on 08/22/21  Ferritin  Result Value Ref Range   Ferritin 224.2 22.0 - 322.0 ng/mL  Lipid panel  Result Value Ref Range   Cholesterol 221 (H) 0 - 200 mg/dL   Triglycerides 147.0 0.0 - 149.0 mg/dL   HDL 34.90  (L) >39.00 mg/dL   VLDL 29.4 0.0 - 40.0 mg/dL   LDL Cholesterol 156 (H) 0 - 99 mg/dL   Total CHOL/HDL Ratio 6    NonHDL 185.69   PSA  Result Value Ref Range   PSA 0.81 0.10 - 4.00 ng/mL  Comprehensive metabolic panel  Result Value Ref Range   Sodium 138 135 - 145 mEq/L   Potassium 4.2 3.5 - 5.1 mEq/L   Chloride 105 96 - 112 mEq/L   CO2 25 19 - 32 mEq/L   Glucose, Bld 105 (H) 70 - 99 mg/dL   BUN 10 6 - 23 mg/dL   Creatinine, Ser 0.83 0.40 - 1.50 mg/dL   Total Bilirubin 0.4 0.2 - 1.2 mg/dL   Alkaline Phosphatase 70 39 - 117 U/L   AST 19 0 - 37 U/L   ALT 25 0 - 53 U/L   Total Protein 7.0 6.0 - 8.3 g/dL   Albumin 4.4 3.5 - 5.2 g/dL   GFR 99.20 >60.00 mL/min   Calcium 9.0 8.4 - 10.5 mg/dL  TSH  Result Value Ref Range   TSH 1.61 0.35 - 5.50 uIU/mL  Uric acid  Result Value Ref Range   Uric Acid, Serum 5.5 4.0 - 7.8 mg/dL   Assessment & Plan:   Problem List Items Addressed This Visit       Other   COVID-19 virus infection - Primary    Patient tested positive for COVID-19 at home on 12/07/2021.  This is his first time he has had COVID-19.  States that he works for [employer] and they require a note to return to work when you have been out for 3+ days.  Patient has finished quarantine.  States he has not returned to work until Monday, 12/18/2021.  States he has to see a provider through his employer on Friday.  Patient's symptoms have almost resolved and is afebrile.  Note provided electronically        No orders of the defined types were placed in this encounter.  No orders of the defined types were placed in this encounter.   I discussed the assessment and treatment plan with the patient. The patient was provided an opportunity to ask questions and all were answered. The patient agreed with the plan and demonstrated an understanding of the instructions. The patient was advised to call back or seek an in-person evaluation if the symptoms worsen or if the condition fails to  improve as anticipated.  Follow up plan: Return if symptoms worsen or fail to improve.  Romilda Garret, NP

## 2021-12-13 NOTE — Assessment & Plan Note (Signed)
Patient tested positive for COVID-19 at home on 12/07/2021.  This is his first time he has had COVID-19.  States that he works for [employer] and they require a note to return to work when you have been out for 3+ days.  Patient has finished quarantine.  States he has not returned to work until Monday, 12/18/2021.  States he has to see a provider through his employer on Friday.  Patient's symptoms have almost resolved and is afebrile.  Note provided electronically

## 2022-04-30 ENCOUNTER — Encounter: Payer: Self-pay | Admitting: Gastroenterology

## 2022-06-20 IMAGING — CR DG ORBITS FOR FOREIGN BODY
2 series · 2 of 2 positions shown · non-contrast
Comparison: None.

CLINICAL DATA: Metal working/exposure; clearance prior to MRI

EXAM:
ORBITS FOR FOREIGN BODY - 2 VIEW

[w orbit pa (1 of 2)]
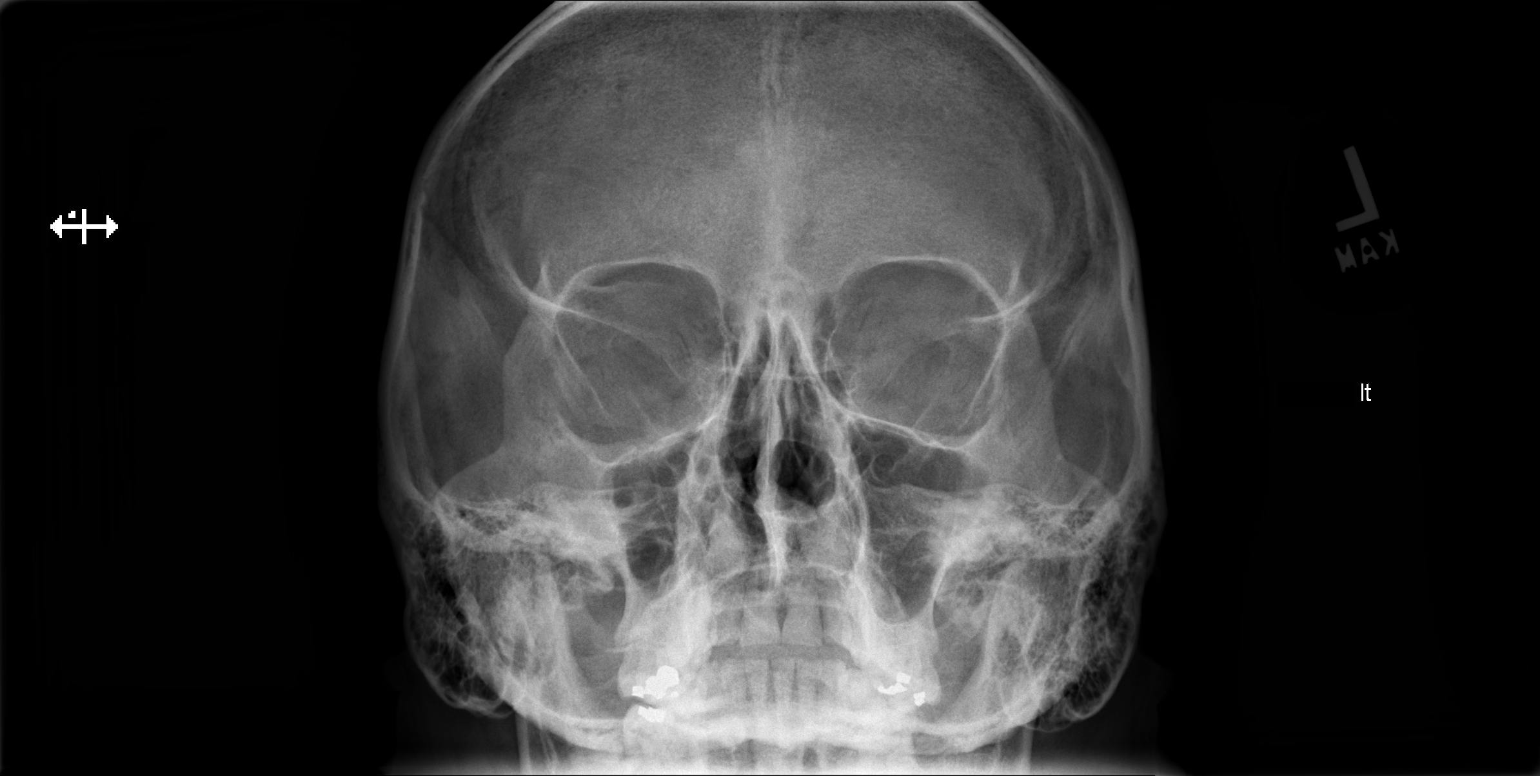

[w orbit pa (2 of 2)]
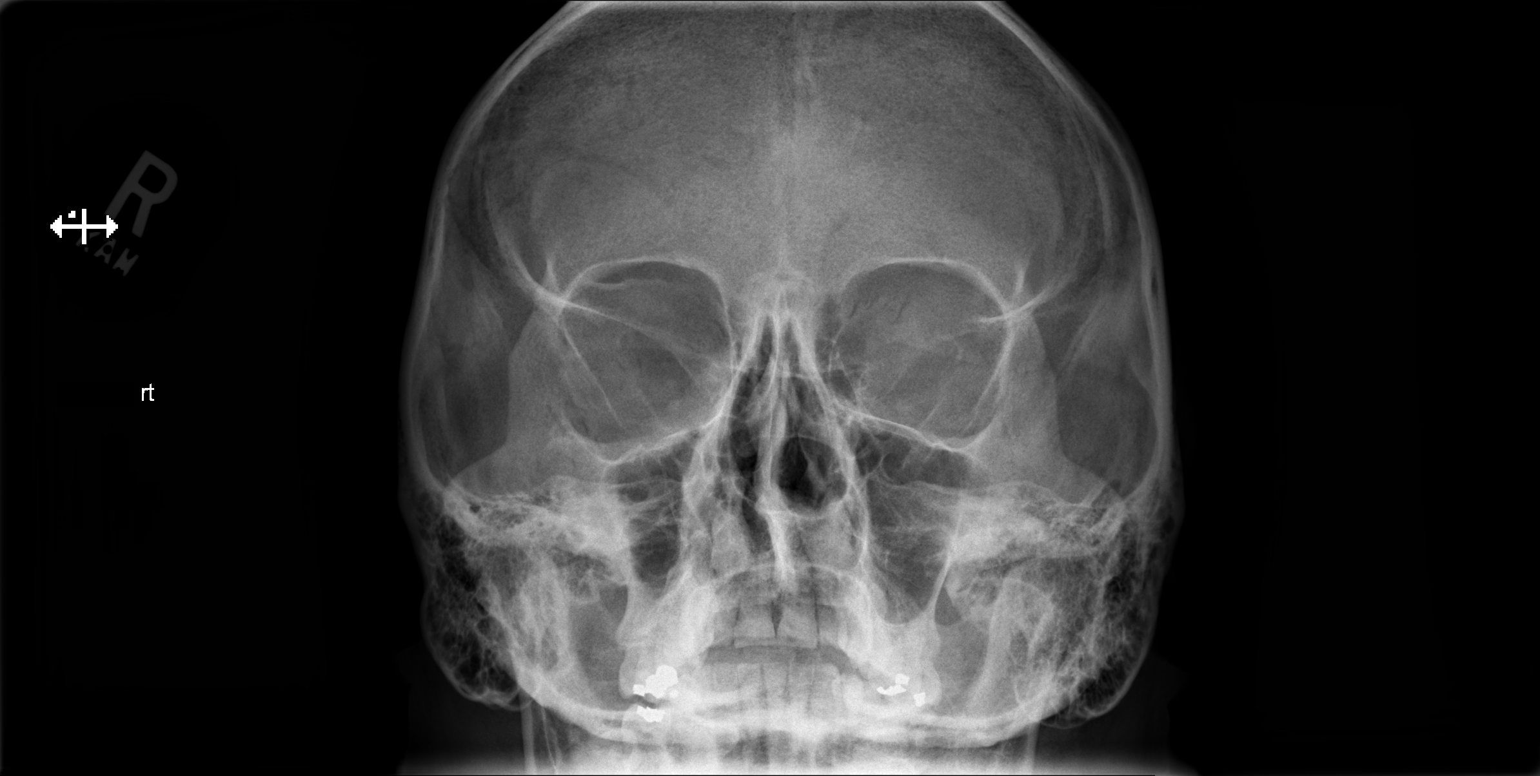

[2 of 2 positions shown; findings below may reference images not displayed]

FINDINGS: There is no evidence of metallic foreign body within the orbits. No
significant bone abnormality identified.
IMPRESSION: No evidence of metallic foreign body within the orbits.

## 2022-09-03 ENCOUNTER — Other Ambulatory Visit: Payer: Self-pay | Admitting: Family Medicine

## 2022-09-03 DIAGNOSIS — M1A09X0 Idiopathic chronic gout, multiple sites, without tophus (tophi): Secondary | ICD-10-CM

## 2022-09-03 DIAGNOSIS — I10 Essential (primary) hypertension: Secondary | ICD-10-CM

## 2022-09-04 NOTE — Telephone Encounter (Signed)
E-scribed refills.  Plz schedule CPE and fasting lab (no food/drink- except water and/or blk coffee 5 hrs prior) visits for additional refills.  

## 2022-09-04 NOTE — Telephone Encounter (Signed)
Lvmtcb. Sent mychart message  

## 2022-09-05 ENCOUNTER — Other Ambulatory Visit: Payer: Self-pay | Admitting: Family Medicine

## 2022-09-05 DIAGNOSIS — I10 Essential (primary) hypertension: Secondary | ICD-10-CM

## 2022-09-23 DIAGNOSIS — J069 Acute upper respiratory infection, unspecified: Secondary | ICD-10-CM | POA: Diagnosis not present

## 2022-09-23 DIAGNOSIS — Z20822 Contact with and (suspected) exposure to covid-19: Secondary | ICD-10-CM | POA: Diagnosis not present

## 2022-10-06 ENCOUNTER — Other Ambulatory Visit: Payer: Self-pay | Admitting: Family Medicine

## 2022-10-06 DIAGNOSIS — I10 Essential (primary) hypertension: Secondary | ICD-10-CM

## 2022-10-06 DIAGNOSIS — M1A09X0 Idiopathic chronic gout, multiple sites, without tophus (tophi): Secondary | ICD-10-CM

## 2022-10-08 NOTE — Telephone Encounter (Signed)
E-scribed refills.  Plz schedule CPE and fasting lab (no food/drink- except water and/or blk coffee 5 hrs prior) visits for additional refills.

## 2022-10-08 NOTE — Telephone Encounter (Signed)
LVMTCB and schedule

## 2022-11-11 ENCOUNTER — Other Ambulatory Visit: Payer: Self-pay | Admitting: Family Medicine

## 2022-11-11 DIAGNOSIS — M1A09X0 Idiopathic chronic gout, multiple sites, without tophus (tophi): Secondary | ICD-10-CM

## 2022-11-11 DIAGNOSIS — I10 Essential (primary) hypertension: Secondary | ICD-10-CM

## 2022-11-13 NOTE — Telephone Encounter (Signed)
ERx 

## 2022-11-26 ENCOUNTER — Encounter: Payer: Self-pay | Admitting: Family Medicine

## 2022-11-26 NOTE — Telephone Encounter (Signed)
Noted  

## 2022-11-27 ENCOUNTER — Encounter: Payer: Self-pay | Admitting: Family Medicine

## 2022-11-27 ENCOUNTER — Ambulatory Visit: Payer: BC Managed Care – PPO | Admitting: Family Medicine

## 2022-11-27 VITALS — BP 138/82 | HR 83 | Temp 98.1°F | Ht 69.5 in | Wt 221.4 lb

## 2022-11-27 DIAGNOSIS — M1A09X0 Idiopathic chronic gout, multiple sites, without tophus (tophi): Secondary | ICD-10-CM

## 2022-11-27 DIAGNOSIS — N401 Enlarged prostate with lower urinary tract symptoms: Secondary | ICD-10-CM

## 2022-11-27 DIAGNOSIS — R197 Diarrhea, unspecified: Secondary | ICD-10-CM

## 2022-11-27 DIAGNOSIS — Z125 Encounter for screening for malignant neoplasm of prostate: Secondary | ICD-10-CM | POA: Diagnosis not present

## 2022-11-27 DIAGNOSIS — Z23 Encounter for immunization: Secondary | ICD-10-CM

## 2022-11-27 DIAGNOSIS — R339 Retention of urine, unspecified: Secondary | ICD-10-CM | POA: Diagnosis not present

## 2022-11-27 DIAGNOSIS — F1011 Alcohol abuse, in remission: Secondary | ICD-10-CM

## 2022-11-27 DIAGNOSIS — I10 Essential (primary) hypertension: Secondary | ICD-10-CM | POA: Diagnosis not present

## 2022-11-27 DIAGNOSIS — M1990 Unspecified osteoarthritis, unspecified site: Secondary | ICD-10-CM | POA: Diagnosis not present

## 2022-11-27 DIAGNOSIS — Z0000 Encounter for general adult medical examination without abnormal findings: Secondary | ICD-10-CM | POA: Diagnosis not present

## 2022-11-27 DIAGNOSIS — F17200 Nicotine dependence, unspecified, uncomplicated: Secondary | ICD-10-CM

## 2022-11-27 DIAGNOSIS — E785 Hyperlipidemia, unspecified: Secondary | ICD-10-CM

## 2022-11-27 DIAGNOSIS — R3911 Hesitancy of micturition: Secondary | ICD-10-CM

## 2022-11-27 DIAGNOSIS — F4323 Adjustment disorder with mixed anxiety and depressed mood: Secondary | ICD-10-CM

## 2022-11-27 DIAGNOSIS — E66811 Obesity, class 1: Secondary | ICD-10-CM

## 2022-11-27 LAB — POC URINALSYSI DIPSTICK (AUTOMATED)
Bilirubin, UA: NEGATIVE
Blood, UA: NEGATIVE
Glucose, UA: NEGATIVE
Ketones, UA: NEGATIVE
Leukocytes, UA: NEGATIVE
Nitrite, UA: NEGATIVE
Protein, UA: NEGATIVE
Spec Grav, UA: 1.015 (ref 1.010–1.025)
Urobilinogen, UA: 0.2 U/dL
pH, UA: 7.5 (ref 5.0–8.0)

## 2022-11-27 LAB — COMPREHENSIVE METABOLIC PANEL
ALT: 24 U/L (ref 0–53)
AST: 21 U/L (ref 0–37)
Albumin: 4.4 g/dL (ref 3.5–5.2)
Alkaline Phosphatase: 76 U/L (ref 39–117)
BUN: 12 mg/dL (ref 6–23)
CO2: 32 meq/L (ref 19–32)
Calcium: 9.5 mg/dL (ref 8.4–10.5)
Chloride: 102 meq/L (ref 96–112)
Creatinine, Ser: 0.94 mg/dL (ref 0.40–1.50)
GFR: 91.07 mL/min (ref >60.00)
Glucose, Bld: 101 mg/dL — ABNORMAL HIGH (ref 70–99)
Potassium: 4.5 meq/L (ref 3.5–5.1)
Sodium: 140 meq/L (ref 135–145)
Total Bilirubin: 0.5 mg/dL (ref 0.2–1.2)
Total Protein: 7.2 g/dL (ref 6.0–8.3)

## 2022-11-27 LAB — LIPID PANEL
Cholesterol: 184 mg/dL (ref 0–200)
HDL: 42.1 mg/dL (ref >39.00)
LDL Cholesterol: 103 mg/dL — ABNORMAL HIGH (ref 0–99)
NonHDL: 142.28
Total CHOL/HDL Ratio: 4
Triglycerides: 197 mg/dL — ABNORMAL HIGH (ref 0.0–149.0)
VLDL: 39.4 mg/dL (ref 0.0–40.0)

## 2022-11-27 LAB — PSA: PSA: 1.7 ng/mL (ref 0.10–4.00)

## 2022-11-27 LAB — SEDIMENTATION RATE: Sed Rate: 8 mm/h (ref 0–20)

## 2022-11-27 LAB — URIC ACID: Uric Acid, Serum: 5.3 mg/dL (ref 4.0–7.8)

## 2022-11-27 MED ORDER — LOSARTAN POTASSIUM 50 MG PO TABS
50.0000 mg | ORAL_TABLET | Freq: Every day | ORAL | 4 refills | Status: DC
Start: 2022-11-27 — End: 2023-12-14

## 2022-11-27 MED ORDER — ALBUTEROL SULFATE HFA 108 (90 BASE) MCG/ACT IN AERS: 2.0000 | INHALATION_SPRAY | Freq: Four times a day (QID) | RESPIRATORY_TRACT | 3 refills | Status: DC | PRN

## 2022-11-27 MED ORDER — METOPROLOL SUCCINATE ER 25 MG PO TB24
25.0000 mg | ORAL_TABLET | Freq: Every day | ORAL | 4 refills | Status: DC
Start: 2022-11-27 — End: 2023-12-14

## 2022-11-27 MED ORDER — TAMSULOSIN HCL 0.4 MG PO CAPS: 0.4000 mg | ORAL_CAPSULE | Freq: Every day | ORAL | 3 refills | Status: DC

## 2022-11-27 MED ORDER — ALLOPURINOL 100 MG PO TABS
200.0000 mg | ORAL_TABLET | Freq: Every day | ORAL | 4 refills | Status: DC
Start: 2022-11-27 — End: 2023-12-14

## 2022-11-27 NOTE — Progress Notes (Unsigned)
Ph: [phone] Fax: [phone]   Patient ID: Merrilyn Puma, male    DOB: Dec 24, 1965, 57 y.o.   MRN: [id number]  This visit was conducted in person.  BP 138/82   Pulse 83   Temp 98.1 F (36.7 C) (Oral)   Ht 5' 9.5" (1.765 m)   Wt 221 lb 6 oz (100.4 kg)   SpO2 99%   BMI 32.22 kg/m    CC: CPE Subjective:   HPI: Manasseh Pittsley is a 57 y.o. male presenting on 11/27/2022 for Annual Exam (C/o water in B ears. Seems to occur this time every yr.  Also, requests work note from missing work [10/29- 11/7] last wk due to illness and wants to discuss FMLA.)   COVID infection 11/2021.   Out of work last week due to GI illness - now feeling better. Requests excuse from work for [place] this week.   New job at [employer] since 04/2022. Works weekends. Stressful situation at work - coworker drinks then comes in after drinking. This is affecting anxiety. He doesn't want to return to drinking (h/o alcohol abuse s/p rehab). He is requesting FMLA for 61 hours/month (what is allowed through [facility]). He states hasn't missed work since starting new job.  H/o anxiety > depression - feels this is flaring due to current work stressor.   Continues allopurinol 200mg  daily without recent gout flares.   Fasting today for labs.   20 lbs down since last physical 08/2021  Preventative: COLONOSCOPY 05/2017 - 3 TA, mild diverticulosis, int hem, rpt 5 yrs Russella Dar) - # provided to schedule rpt Prostate screening - yearly PSA. No fmhx prostate cancer. Nocturia x1. notes incomplete emptying, trouble starting stream. No dysuria, urgency, hematuria. Lung cancer screening - 30+ PY hx. Interested - referral again placed Flu shot - doesn't get  COVID vaccine - did not get Tetanus - 2016 Shingrix - 08/2021, rpt today. Had complicated case of chicken pox with PNA at age 77 yo.  Sleep - averaging 6 hours/night Seat belt use discussed.  Sunscreen use discussed. No changing moles.  Smoking - ongoing, 1/2 ppd. Also vaping.   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 Interim medical history since our last visit reviewed. Allergies and medications reviewed and updated. Outpatient Medications Prior to Visit  Medication Sig Dispense Refill   Multiple Vitamins-Minerals (MULTIVITAMIN ADULT PO) Take by mouth.     Omega-3 Fatty Acids (FISH OIL) 1200 MG CAPS Take 2 capsules by mouth daily.     vitamin C (ASCORBIC ACID) 500 MG tablet Take 500 mg by mouth daily.     albuterol (VENTOLIN HFA) 108 (90 Base) MCG/ACT inhaler Inhale 2 puffs into the lungs every 6 (six) hours as needed for wheezing or shortness of breath. 54 g 1   allopurinol (ZYLOPRIM) 100 MG tablet TAKE 2 TABLETS BY MOUTH ONCE DAILY **NEEDS  ANNUAL  APPOINTMENT  FOR  ADDITIONAL  REFILLS** 60 tablet 0   losartan (COZAAR) 50 MG tablet TAKE 1 TABLET BY MOUTH ONCE DAILY **NEEDS ANNUAL APPOINTMENT FOR ADDITIONAL REFILLS** 30 tablet 0  metoprolol succinate (TOPROL-XL) 25 MG 24 hr tablet TAKE 1 TABLET BY MOUTH DAILY 90 tablet 0   fluticasone (FLONASE) 50 MCG/ACT nasal spray Place 2 sprays into both nostrils daily. 48 g 1   No facility-administered medications prior to visit.     Per HPI unless specifically indicated in ROS section below Review of Systems  Constitutional:  Negative for activity change, appetite change, chills, fatigue, fever and unexpected weight change.  HENT:  Negative for hearing loss.   Eyes:  Negative for visual disturbance.  Respiratory:  Negative for cough, chest tightness, shortness of breath and wheezing.   Cardiovascular:  Negative for chest pain,  palpitations and leg swelling.  Gastrointestinal:  Negative for abdominal distention, abdominal pain, blood in stool, constipation, diarrhea, nausea and vomiting.  Genitourinary:  Negative for difficulty urinating and hematuria.  Musculoskeletal:  Negative for arthralgias, myalgias and neck pain.  Skin:  Negative for rash.  Neurological:  Negative for dizziness, seizures, syncope and headaches.  Hematological:  Negative for adenopathy. Does not bruise/bleed easily.  Psychiatric/Behavioral:  Positive for dysphoric mood. The patient is nervous/anxious.        Work related    Objective:  BP 138/82   Pulse 83   Temp 98.1 F (36.7 C) (Oral)   Ht 5' 9.5" (1.765 m)   Wt 221 lb 6 oz (100.4 kg)   SpO2 99%   BMI 32.22 kg/m   Wt Readings from Last 3 Encounters:  11/27/22 221 lb 6 oz (100.4 kg)  08/22/21 243 lb 6 oz (110.4 kg)  07/05/21 255 lb 8 oz (115.9 kg)      Physical Exam Vitals and nursing note reviewed.  Constitutional:      General: He is not in acute distress.    Appearance: Normal appearance. He is well-developed. He is not ill-appearing.  HENT:     Head: Normocephalic and atraumatic.     Right Ear: Hearing, tympanic membrane, ear canal and external ear normal.     Left Ear: Hearing, tympanic membrane, ear canal and external ear normal.     Mouth/Throat:     Mouth: Mucous membranes are moist.     Pharynx: Oropharynx is clear. No oropharyngeal exudate or posterior oropharyngeal erythema.  Eyes:     General: No scleral icterus.    Extraocular Movements: Extraocular movements intact.     Conjunctiva/sclera: Conjunctivae normal.     Pupils: Pupils are equal, round, and reactive to light.  Neck:     Thyroid: No thyroid mass or thyromegaly.  Cardiovascular:     Rate and Rhythm: Normal rate and regular rhythm.     Pulses: Normal pulses.          Radial pulses are 2+ on the right side and 2+ on the left side.     Heart sounds: Normal heart sounds. No murmur heard. Pulmonary:      Effort: Pulmonary effort is normal. No respiratory distress.     Breath sounds: Normal breath sounds. No wheezing, rhonchi or rales.  Abdominal:     General: Bowel sounds are normal. There is no distension.     Palpations: Abdomen is soft. There is no mass.     Tenderness: There is no abdominal tenderness. There is no guarding or rebound.     Hernia: No hernia is present.  Genitourinary:    Prostate: Enlarged (mod, mild induration throughout). Not tender and no nodules present.     Rectum: Normal. No mass, tenderness, anal fissure, external hemorrhoid or internal  hemorrhoid. Normal anal tone.  Musculoskeletal:        General: Normal range of motion.     Cervical back: Normal range of motion and neck supple.     Right lower leg: No edema.     Left lower leg: No edema.  Lymphadenopathy:     Cervical: No cervical adenopathy.  Skin:    General: Skin is warm and dry.     Findings: No rash.  Neurological:     General: No focal deficit present.     Mental Status: He is alert and oriented to person, place, and time.  Psychiatric:        Mood and Affect: Mood normal.        Behavior: Behavior normal.        Thought Content: Thought content normal.        Judgment: Judgment normal.       Results for orders placed or performed in visit on 11/27/22  Lipid panel  Result Value Ref Range   Cholesterol 184 0 - 200 mg/dL   Triglycerides 709.6 (H) 0.0 - 149.0 mg/dL   HDL 28.36 >62.94 mg/dL   VLDL 76.5 0.0 - 46.5 mg/dL   LDL Cholesterol 035 (H) 0 - 99 mg/dL   Total CHOL/HDL Ratio 4    NonHDL 142.28   Comprehensive metabolic panel  Result Value Ref Range   Sodium 140 135 - 145 mEq/L   Potassium 4.5 3.5 - 5.1 mEq/L   Chloride 102 96 - 112 mEq/L   CO2 32 19 - 32 mEq/L   Glucose, Bld 101 (H) 70 - 99 mg/dL   BUN 12 6 - 23 mg/dL   Creatinine, Ser 4.65 0.40 - 1.50 mg/dL   Total Bilirubin 0.5 0.2 - 1.2 mg/dL   Alkaline Phosphatase 76 39 - 117 U/L   AST 21 0 - 37 U/L   ALT 24 0 - 53 U/L    Total Protein 7.2 6.0 - 8.3 g/dL   Albumin 4.4 3.5 - 5.2 g/dL   GFR 68.12 >75.17 mL/min   Calcium 9.5 8.4 - 10.5 mg/dL  Uric acid  Result Value Ref Range   Uric Acid, Serum 5.3 4.0 - 7.8 mg/dL  PSA  Result Value Ref Range   PSA 1.70 0.10 - 4.00 ng/mL  Sedimentation rate  Result Value Ref Range   Sed Rate 8 0 - 20 mm/hr  POCT Urinalysis Dipstick (Automated)  Result Value Ref Range   Color, UA yellow    Clarity, UA clear    Glucose, UA Negative Negative   Bilirubin, UA negative    Ketones, UA negative    Spec Grav, UA 1.015 1.010 - 1.025   Blood, UA negative    pH, UA 7.5 5.0 - 8.0   Protein, UA Negative Negative   Urobilinogen, UA 0.2 0.2 or 1.0 E.U./dL   Nitrite, UA negative    Leukocytes, UA Negative Negative      11/27/2022    8:42 AM 08/22/2021    8:45 AM 05/25/2019    9:45 AM  Depression screen PHQ 2/9  Decreased Interest 1 0 0  Down, Depressed, Hopeless 1 0 0  PHQ - 2 Score 2 0 0  Altered sleeping 1 0   Tired, decreased energy 1 0   Change in appetite 1 0   Feeling bad or failure about yourself  1 0   Trouble concentrating 1 0   Moving slowly or fidgety/restless 1 0   Suicidal thoughts 0 0  PHQ-9 Score 8 0   Difficult doing work/chores Very difficult         11/27/2022    8:42 AM 08/22/2021    8:48 AM  GAD 7 : Generalized Anxiety Score  Nervous, Anxious, on Edge 2 0  Control/stop worrying 1 0  Worry too much - different things 2 0  Trouble relaxing 1 0  Restless 0 0  Easily annoyed or irritable 1 0  Afraid - awful might happen 0 0  Total GAD 7 Score 7 0  Anxiety Difficulty Very difficult    Assessment & Plan:   Problem List Items Addressed This Visit     Health maintenance examination - Primary (Chronic)    Preventative protocols reviewed and updated unless pt declined. Discussed healthy diet and lifestyle.       History of alcohol abuse    Remains abstinent.  Went through rehab at [facility] 2018.      Adjustment disorder with mixed  anxiety and depressed mood    He is not on medication. Notes worsening difficulty dealing with stressful situation with coworker at work, this is affecting ability to do his job.  He requests FMLA for this.  I asked him to send me paperwork to review.      HTN (hypertension)    Chronic, BP stable on current regimen of losartan, metoprolol XL      Relevant Medications   losartan (COZAAR) 50 MG tablet   metoprolol succinate (TOPROL-XL) 25 MG 24 hr tablet   Dyslipidemia    Chronic, not on medication.  Update lipid panel today. The 10-year ASCVD risk score (Arnett DK, et al., 2019) is: 14.4%   Values used to calculate the score:     Age: 62 years     Sex: Male     Is Non-Hispanic African American: No     Diabetic: No     Tobacco smoker: Yes     Systolic Blood Pressure: 138 mmHg     Is BP treated: Yes     HDL Cholesterol: 42.1 mg/dL     Total Cholesterol: 184 mg/dL      Relevant Orders   Lipid panel (Completed)   Comprehensive metabolic panel (Completed)   Smoker    Continued half a pack a day smoker.  Encouraged full cessation. Will refer again to lung cancer screening program.      Relevant Orders   Ambulatory Referral Lung Cancer Screening [facility]   Gout    Update urate on allopurinol 200 mg daily without recent gout flares.      Relevant Medications   allopurinol (ZYLOPRIM) 100 MG tablet   Other Relevant Orders   Uric acid (Completed)   Inflammatory arthritis    Update inflammatory marker. He last saw rheumatologist 2021, thought overlapping mixed connective tissue disease.      Relevant Medications   allopurinol (ZYLOPRIM) 100 MG tablet   Other Relevant Orders   Sedimentation rate (Completed)   Diarrhea    Recent GI illness with symptoms over the weekend, will write letter excusing him from work due to this.      Obesity, Class I, BMI 30-34.9    Noted weight loss. Continue to encourage healthy diet and lifestyle choices to affect sustainable  weight loss.      BPH (benign prostatic hyperplasia)    DRE suspicious for component of BPH and pt symptomatic.  Check PSA, start flomax, update with effect      Relevant Medications   tamsulosin (  FLOMAX) 0.4 MG CAPS capsule   Other Visit Diagnoses     Special screening for malignant neoplasm of prostate       Relevant Orders   PSA (Completed)   Incomplete emptying of bladder       Relevant Orders   POCT Urinalysis Dipstick (Automated) (Completed)   Encounter for immunization       Relevant Orders   Varicella-zoster vaccine IM (Completed)        Meds ordered this encounter  Medications   albuterol (VENTOLIN HFA) 108 (90 Base) MCG/ACT inhaler    Sig: Inhale 2 puffs into the lungs every 6 (six) hours as needed for wheezing or shortness of breath.    Dispense:  18 g    Refill:  3   allopurinol (ZYLOPRIM) 100 MG tablet    Sig: Take 2 tablets (200 mg total) by mouth daily.    Dispense:  180 tablet    Refill:  4   losartan (COZAAR) 50 MG tablet    Sig: Take 1 tablet (50 mg total) by mouth daily.    Dispense:  90 tablet    Refill:  4   metoprolol succinate (TOPROL-XL) 25 MG 24 hr tablet    Sig: Take 1 tablet (25 mg total) by mouth daily.    Dispense:  90 tablet    Refill:  4   tamsulosin (FLOMAX) 0.4 MG CAPS capsule    Sig: Take 1 capsule (0.4 mg total) by mouth daily.    Dispense:  30 capsule    Refill:  3    Orders Placed This Encounter  Procedures   Varicella-zoster vaccine IM   Lipid panel   Comprehensive metabolic panel   Uric acid   PSA   Sedimentation rate   Ambulatory Referral Lung Cancer Screening [facility]    Referral Priority:   Routine    Referral Type:   Consultation    Referral Reason:   Specialty Services Required    Number of Visits Requested:   1   POCT Urinalysis Dipstick (Automated)    Patient Instructions  Urinalysis today  Labs today  2nd and final shingles shot today  You may call [facility] to schedule an appointment at  [phone].  Lung cancer screening program phone number is [phone]. I have placed referral for this program as well.  Send me FMLA forms to review.  Letter for work provided today.  Trial flomax 0.4mg  tablet nightly for urination.   Follow up plan: Return in about 1 year (around 11/27/2023), or if symptoms worsen or fail to improve, for annual exam, prior fasting for blood work.  Eustaquio Boyden, MD

## 2022-11-27 NOTE — Assessment & Plan Note (Signed)
Preventative protocols reviewed and updated unless pt declined. Discussed healthy diet and lifestyle.  

## 2022-11-27 NOTE — Patient Instructions (Addendum)
Urinalysis today  Labs today  2nd and final shingles shot today  You may call [facility] to schedule an appointment at [phone].  Lung cancer screening program phone number is [phone]. I have placed referral for this program as well.  Send me FMLA forms to review.  Letter for work provided today.  Trial flomax 0.4mg  tablet nightly for urination.

## 2022-11-28 ENCOUNTER — Encounter: Payer: Self-pay | Admitting: Family Medicine

## 2022-11-28 DIAGNOSIS — E66811 Obesity, class 1: Secondary | ICD-10-CM | POA: Insufficient documentation

## 2022-11-28 DIAGNOSIS — N400 Enlarged prostate without lower urinary tract symptoms: Secondary | ICD-10-CM | POA: Insufficient documentation

## 2022-11-28 DIAGNOSIS — R197 Diarrhea, unspecified: Secondary | ICD-10-CM | POA: Insufficient documentation

## 2022-11-28 NOTE — Assessment & Plan Note (Signed)
DRE suspicious for component of BPH and pt symptomatic.  Check PSA, start flomax, update with effect

## 2022-11-28 NOTE — Assessment & Plan Note (Signed)
Update urate on allopurinol 200 mg daily without recent gout flares.

## 2022-11-28 NOTE — Assessment & Plan Note (Signed)
Continued half a pack a day smoker.  Encouraged full cessation. Will refer again to lung cancer screening program.

## 2022-11-28 NOTE — Assessment & Plan Note (Addendum)
Update inflammatory marker. He last saw rheumatologist 2021, thought overlapping mixed connective tissue disease.

## 2022-11-28 NOTE — Assessment & Plan Note (Addendum)
Noted weight loss. Continue to encourage healthy diet and lifestyle choices to affect sustainable weight loss.

## 2022-11-28 NOTE — Assessment & Plan Note (Signed)
Chronic, not on medication.  Update lipid panel today. The 10-year ASCVD risk score (Arnett DK, et al., 2019) is: 14.4%   Values used to calculate the score:     Age: 57 years     Sex: Male     Is Non-Hispanic African American: No     Diabetic: No     Tobacco smoker: Yes     Systolic Blood Pressure: 138 mmHg     Is BP treated: Yes     HDL Cholesterol: 42.1 mg/dL     Total Cholesterol: 184 mg/dL

## 2022-11-28 NOTE — Assessment & Plan Note (Signed)
He is not on medication. Notes worsening difficulty dealing with stressful situation with coworker at work, this is affecting ability to do his job.  He requests FMLA for this.  I asked him to send me paperwork to review.

## 2022-11-28 NOTE — Assessment & Plan Note (Signed)
Chronic, BP stable on current regimen of losartan, metoprolol XL

## 2022-11-28 NOTE — Assessment & Plan Note (Signed)
Recent GI illness with symptoms over the weekend, will write letter excusing him from work due to this.

## 2022-11-28 NOTE — Assessment & Plan Note (Signed)
Remains abstinent.  Went through rehab at [facility] 2018.

## 2022-11-29 ENCOUNTER — Encounter: Payer: Self-pay | Admitting: Family Medicine

## 2022-11-30 ENCOUNTER — Encounter: Payer: Self-pay | Admitting: Family Medicine

## 2022-11-30 ENCOUNTER — Telehealth: Payer: Self-pay

## 2022-11-30 NOTE — Telephone Encounter (Signed)
Received faxed Short Term Disability [STD] form [employer].   Placed form in Dr Timoteo Expose box.

## 2022-12-03 ENCOUNTER — Encounter: Payer: Self-pay | Admitting: Family Medicine

## 2022-12-03 ENCOUNTER — Telehealth (INDEPENDENT_AMBULATORY_CARE_PROVIDER_SITE_OTHER): Payer: BC Managed Care – PPO | Admitting: Family Medicine

## 2022-12-03 VITALS — Temp 99.3°F | Ht 69.5 in | Wt 225.0 lb

## 2022-12-03 DIAGNOSIS — U071 COVID-19: Secondary | ICD-10-CM

## 2022-12-03 MED ORDER — NIRMATRELVIR/RITONAVIR (PAXLOVID)TABLET: 3.0000 | ORAL_TABLET | Freq: Two times a day (BID) | ORAL | 0 refills | Status: AC

## 2022-12-03 NOTE — Telephone Encounter (Signed)
Pt added to schedule

## 2022-12-03 NOTE — Assessment & Plan Note (Signed)
Reviewed currently approved antiviral treatments.  Reviewed expected course of illness, anticipated course of recovery, as well as red flags to suggest COVID pneumonia and/or to seek urgent in-person care. Reviewed CDC isolation/quarantine guidelines.  Encouraged fluids and rest. Reviewed further supportive care measures at home including vit C 500mg  bid, vit D 2000 IU daily, zinc 100mg  daily, tylenol PRN, pepcid 20mg  BID PRN.   Recommend:  Full dose paxlovid Paxlovid drug interactions:  Flomax-  hold while on paxlovid  [email or web address]

## 2022-12-03 NOTE — Progress Notes (Signed)
Ph: [phone] Fax: [phone]   Patient ID: Daniel May, male    DOB: 1965/05/22, 57 y.o.   MRN: [id number]  Virtual visit completed through MyChart, a video enabled telemedicine application. Due to national recommendations of social distancing due to COVID-19, a virtual visit is felt to be most appropriate for this patient at this time. Reviewed limitations, risks, security and privacy concerns of performing a virtual visit and the availability of in person appointments. I also reviewed that there may be a patient responsible charge related to this service. The patient agreed to proceed.   Patient location: home Provider location: [place] [facility], office Persons participating in this virtual visit: patient, provider   If any vitals were documented, they were collected by patient at home unless specified below.    Temp 99.3 F (37.4 C)   Ht 5' 9.5" (1.765 m)   Wt 225 lb (102.1 kg)   BMI 32.75 kg/m    CC: COVID  Subjective:   HPI: Daniel May is a 57 y.o. male presenting on 12/03/2022 for Covid Positive (C/o fever- max 101.6, HA, fatigue, nausea and diarrhea. Sxs started 11/28/22. Received 2nd Shingrix dose- 11/27/22. Pos home Covid test- 12/02/22. )   He just received 2nd Shingrix dose.   First day of symptoms: 11/28/2022 Tested COVID positive: 12/02/2022  Current symptoms: fever Tmax 101.6, HA, fatigue, nausea/vomiting, diarrhea, anorexia, dyspnea and intermittent mild congestion. Last day of fever was yesterday  No: cough, ST, ear pain Treatments to date: tylenol, kaopectate, mucinex Risk factors include: gender, age, HTN, smoker, obesity  COVID vaccination status: did not receive      Relevant past medical, surgical, family and social history reviewed and updated as indicated. Interim medical history since our last visit reviewed. Allergies and medications reviewed and updated. Outpatient Medications Prior to Visit  Medication Sig Dispense Refill   albuterol  (VENTOLIN HFA) 108 (90 Base) MCG/ACT inhaler Inhale 2 puffs into the lungs every 6 (six) hours as needed for wheezing or shortness of breath. 18 g 3   allopurinol (ZYLOPRIM) 100 MG tablet Take 2 tablets (200 mg total) by mouth daily. 180 tablet 4   losartan (COZAAR) 50 MG tablet Take 1 tablet (50 mg total) by mouth daily. 90 tablet 4   metoprolol succinate (TOPROL-XL) 25 MG 24 hr tablet Take 1 tablet (25 mg total) by mouth daily. 90 tablet 4   Multiple Vitamins-Minerals (MULTIVITAMIN ADULT PO) Take by mouth.     Omega-3 Fatty Acids (FISH OIL) 1200 MG CAPS Take 2 capsules by mouth daily.     tamsulosin (FLOMAX) 0.4 MG CAPS capsule Take 1 capsule (0.4 mg total) by mouth daily. 30 capsule 3   vitamin C (ASCORBIC ACID) 500 MG tablet Take 500 mg by mouth daily.     No facility-administered medications prior to visit.     Per HPI unless specifically indicated in ROS section below Review of Systems Objective:  Temp 99.3 F (37.4 C)   Ht 5' 9.5" (1.765 m)   Wt 225 lb (102.1 kg)   BMI 32.75 kg/m   Wt Readings from Last 3 Encounters:  12/03/22 225 lb (102.1 kg)  11/27/22 221 lb 6 oz (100.4 kg)  08/22/21 243 lb 6 oz (110.4 kg)       Physical exam: Gen: alert, NAD, not ill appearing Pulm: speaks in complete sentences without increased work of breathing Psych: normal mood, normal thought content      Lab Results  Component Value Date  NA 140 11/27/2022   CL 102 11/27/2022   K 4.5 11/27/2022   CO2 32 11/27/2022   BUN 12 11/27/2022   CREATININE 0.94 11/27/2022   GFR 91.07 11/27/2022   CALCIUM 9.5 11/27/2022   ALBUMIN 4.4 11/27/2022   GLUCOSE 101 (H) 11/27/2022    Assessment & Plan:   COVID-19 virus infection Assessment & Plan: Reviewed currently approved antiviral treatments.  Reviewed expected course of illness, anticipated course of recovery, as well as red flags to suggest COVID pneumonia and/or to seek urgent in-person care. Reviewed CDC isolation/quarantine guidelines.   Encouraged fluids and rest. Reviewed further supportive care measures at home including vit C 500mg  bid, vit D 2000 IU daily, zinc 100mg  daily, tylenol PRN, pepcid 20mg  BID PRN.   Recommend:  Full dose paxlovid Paxlovid drug interactions:  Flomax-  hold while on paxlovid  paxlovid.[email or web address]    Other orders -     nirmatrelvir/ritonavir; Take 3 tablets by mouth 2 (two) times daily for 5 days. (Take nirmatrelvir 150 mg two tablets twice daily for 5 days and ritonavir 100 mg one tablet twice daily for 5 days) Patient GFR is 91  Dispense: 30 tablet; Refill: 0     I discussed the assessment and treatment plan with the patient. The patient was provided an opportunity to ask questions and all were answered. The patient agreed with the plan and demonstrated an understanding of the instructions. The patient was advised to call back or seek an in-person evaluation if the symptoms worsen or if the condition fails to improve as anticipated.  Follow up plan: No follow-ups on file.  Eustaquio Boyden, MD

## 2022-12-03 NOTE — Telephone Encounter (Signed)
Per Dr Reece Agar, pt can be added for video visit today at 4:30.   Lvm asking pt to call back. Need to relay info above.   Also sending MyChart message.

## 2022-12-04 NOTE — Telephone Encounter (Signed)
Filled out and in Daniel May's box.  See latest mychart note and video visit for COVID

## 2022-12-04 NOTE — Telephone Encounter (Signed)
Faxed to number provided on form.

## 2022-12-06 NOTE — Telephone Encounter (Signed)
Already addressed

## 2022-12-06 NOTE — Telephone Encounter (Signed)
Seen virtually with COVID

## 2022-12-11 ENCOUNTER — Encounter: Payer: Self-pay | Admitting: Family Medicine

## 2022-12-12 ENCOUNTER — Telehealth: Payer: Self-pay

## 2022-12-12 NOTE — Telephone Encounter (Signed)
Received faxed STD form from [employer]. Placed form in Dr Timoteo Expose box.

## 2022-12-14 ENCOUNTER — Encounter: Payer: Self-pay | Admitting: Family Medicine

## 2022-12-14 ENCOUNTER — Telehealth (INDEPENDENT_AMBULATORY_CARE_PROVIDER_SITE_OTHER): Payer: BC Managed Care – PPO | Admitting: Family Medicine

## 2022-12-14 VITALS — Temp 99.0°F | Ht 69.5 in | Wt 226.0 lb

## 2022-12-14 DIAGNOSIS — Z0279 Encounter for issue of other medical certificate: Secondary | ICD-10-CM

## 2022-12-14 DIAGNOSIS — F17200 Nicotine dependence, unspecified, uncomplicated: Secondary | ICD-10-CM | POA: Diagnosis not present

## 2022-12-14 DIAGNOSIS — M1A09X0 Idiopathic chronic gout, multiple sites, without tophus (tophi): Secondary | ICD-10-CM

## 2022-12-14 DIAGNOSIS — F1011 Alcohol abuse, in remission: Secondary | ICD-10-CM | POA: Diagnosis not present

## 2022-12-14 DIAGNOSIS — U071 COVID-19: Secondary | ICD-10-CM

## 2022-12-14 DIAGNOSIS — F4323 Adjustment disorder with mixed anxiety and depressed mood: Secondary | ICD-10-CM | POA: Diagnosis not present

## 2022-12-14 NOTE — Progress Notes (Unsigned)
Ph: [phone] Fax: [phone]   Patient ID: Daniel May, male    DOB: 1965/02/02, 57 y.o.   MRN: [id number]  Virtual visit completed through MyChart, a video enabled telemedicine application. Due to national recommendations of social distancing due to COVID-19, a virtual visit is felt to be most appropriate for this patient at this time. Reviewed limitations, risks, security and privacy concerns of performing a virtual visit and the availability of in person appointments. I also reviewed that there may be a patient responsible charge related to this service. The patient agreed to proceed.   Patient location: home Provider location: [facility], office Persons participating in this virtual visit: patient, provider   If any vitals were documented, they were collected by patient at home unless specified below.    Temp 99 F (37.2 C)   Ht 5' 9.5" (1.765 m)   Wt 226 lb (102.5 kg)   BMI 32.90 kg/m   BP Readings from Last 3 Encounters:  11/27/22 138/82  08/22/21 (!) 140/90  07/05/21 (!) 152/98    Pulse Readings from Last 3 Encounters:  11/27/22 83  08/22/21 91  07/05/21 100   CC: discuss FMLA, STD, post-COVID fatigue and headache Subjective:   HPI: Daniel May is a 57 y.o. male presenting on 12/14/2022 for Fatigue (C/o minor fatigue, nausea and HA post Covid. Completed med, helpful. Also, wants to discuss allopurinol. )   See prior notes for details.  Acute GI illness 11/20/2022 - 11/28/2022, at which time he developed COVID symptoms. Out of work since 11/20/2022. Planning to return on Monday 12/17/2022 - requests letter to return to work   Needs clarification on FMLA continuous and intermittent leave as well as STD. Forms will be filled out and returned.  Stressful situation at work with coworker involving hygiene and alcohol use - this can be triggering for him and his adjustment disorder in h/o alcohol abuse s/p stay at rehab 2018.   Post-COVID fatigue,  headache, nausea - overall improving.  He has lingering headache, that improves when upright.   Continued smoker 1/2 ppd.  Continues allopurinol 200mg  daily. No recent gout flares. Lab Results  Component Value Date   LABURIC 5.3 11/27/2022       Relevant past medical, surgical, family and social history reviewed and updated as indicated. Interim medical history since our last visit reviewed. Allergies and medications reviewed and updated. Outpatient Medications Prior to Visit  Medication Sig Dispense Refill   albuterol (VENTOLIN HFA) 108 (90 Base) MCG/ACT inhaler Inhale 2 puffs into the lungs every 6 (six) hours as needed for wheezing or shortness of breath. 18 g 3   allopurinol (ZYLOPRIM) 100 MG tablet Take 2 tablets (200 mg total) by mouth daily. 180 tablet 4   losartan (COZAAR) 50 MG tablet Take 1 tablet (50 mg total) by mouth daily. 90 tablet 4   metoprolol succinate (TOPROL-XL) 25 MG 24 hr tablet Take 1 tablet (25 mg total) by mouth daily. 90 tablet 4   Multiple Vitamins-Minerals (MULTIVITAMIN ADULT PO) Take by mouth.     Omega-3 Fatty Acids (FISH OIL) 1200 MG CAPS Take 2 capsules by mouth daily.     tamsulosin (FLOMAX) 0.4 MG CAPS capsule Take 1 capsule (0.4 mg total) by mouth daily. 30 capsule 3   vitamin C (ASCORBIC ACID) 500 MG tablet Take 500 mg by mouth daily.     No facility-administered medications prior to visit.     Per HPI unless specifically indicated in ROS  section below Review of Systems Objective:  Temp 99 F (37.2 C)   Ht 5' 9.5" (1.765 m)   Wt 226 lb (102.5 kg)   BMI 32.90 kg/m   Wt Readings from Last 3 Encounters:  12/14/22 226 lb (102.5 kg)  12/03/22 225 lb (102.1 kg)  11/27/22 221 lb 6 oz (100.4 kg)       Physical exam: Gen: alert, NAD, not ill appearing Pulm: speaks in complete sentences without increased work of breathing Psych: normal mood, normal thought content      Results for orders placed or performed in visit on 11/27/22  Lipid panel   Result Value Ref Range   Cholesterol 184 0 - 200 mg/dL   Triglycerides 161.0 (H) 0.0 - 149.0 mg/dL   HDL 96.04 >54.09 mg/dL   VLDL 81.1 0.0 - 91.4 mg/dL   LDL Cholesterol 782 (H) 0 - 99 mg/dL   Total CHOL/HDL Ratio 4    NonHDL 142.28   Comprehensive metabolic panel  Result Value Ref Range   Sodium 140 135 - 145 mEq/L   Potassium 4.5 3.5 - 5.1 mEq/L   Chloride 102 96 - 112 mEq/L   CO2 32 19 - 32 mEq/L   Glucose, Bld 101 (H) 70 - 99 mg/dL   BUN 12 6 - 23 mg/dL   Creatinine, Ser 9.56 0.40 - 1.50 mg/dL   Total Bilirubin 0.5 0.2 - 1.2 mg/dL   Alkaline Phosphatase 76 39 - 117 U/L   AST 21 0 - 37 U/L   ALT 24 0 - 53 U/L   Total Protein 7.2 6.0 - 8.3 g/dL   Albumin 4.4 3.5 - 5.2 g/dL   GFR 21.30 >86.57 mL/min   Calcium 9.5 8.4 - 10.5 mg/dL  Uric acid  Result Value Ref Range   Uric Acid, Serum 5.3 4.0 - 7.8 mg/dL  PSA  Result Value Ref Range   PSA 1.70 0.10 - 4.00 ng/mL  Sedimentation rate  Result Value Ref Range   Sed Rate 8 0 - 20 mm/hr  POCT Urinalysis Dipstick (Automated)  Result Value Ref Range   Color, UA yellow    Clarity, UA clear    Glucose, UA Negative Negative   Bilirubin, UA negative    Ketones, UA negative    Spec Grav, UA 1.015 1.010 - 1.025   Blood, UA negative    pH, UA 7.5 5.0 - 8.0   Protein, UA Negative Negative   Urobilinogen, UA 0.2 0.2 or 1.0 E.U./dL   Nitrite, UA negative    Leukocytes, UA Negative Negative   Assessment & Plan:   Issue of medical certificate Assessment & Plan: FMLA continuous/intermittent leave filled out today.  STD forms filled out today.    History of alcohol abuse Assessment & Plan: Remains abstinent since 2018. Went through rehab at [facility] in 2018.    Adjustment disorder with mixed anxiety and depressed mood Assessment & Plan: Unhealthy situation with coworker at work testing his abstinence from alcohol.  Intermittent FMLA will be requested for 36 hrs/month as needed.  Previous trials of medication were  ineffective  (wellbutrin, paxil, celexa).    Smoker Assessment & Plan: Encouraged full cessation.  Referred to lung cancer screening program 11/2022   Idiopathic chronic gout of multiple sites without tophus Assessment & Plan: Stable period on allopurinol 200mg  daily without recent gout flares   COVID-19 virus infection Assessment & Plan: Continues improving well from this.       I discussed the assessment and  treatment plan with the patient. The patient was provided an opportunity to ask questions and all were answered. The patient agreed with the plan and demonstrated an understanding of the instructions. The patient was advised to call back or seek an in-person evaluation if the symptoms worsen or if the condition fails to improve as anticipated.  Follow up plan: No follow-ups on file.  Eustaquio Boyden, MD

## 2022-12-14 NOTE — Telephone Encounter (Signed)
Will discuss at OV today.

## 2022-12-15 NOTE — Assessment & Plan Note (Addendum)
Unhealthy situation with coworker at work testing his abstinence from alcohol.  Intermittent FMLA will be requested for 36 hrs/month as needed.  Previous trials of medication were ineffective  (wellbutrin, paxil, celexa).

## 2022-12-15 NOTE — Assessment & Plan Note (Addendum)
Remains abstinent since 2018. Went through rehab at [facility] in 2018.

## 2022-12-15 NOTE — Assessment & Plan Note (Signed)
FMLA continuous/intermittent leave filled out today.  STD forms filled out today.

## 2022-12-15 NOTE — Assessment & Plan Note (Signed)
Continues improving well from this.

## 2022-12-15 NOTE — Assessment & Plan Note (Signed)
Stable period on allopurinol 200mg  daily without recent gout flares

## 2022-12-15 NOTE — Assessment & Plan Note (Addendum)
Encouraged full cessation.  Referred to lung cancer screening program 11/2022

## 2023-04-23 ENCOUNTER — Other Ambulatory Visit: Payer: Self-pay | Admitting: Family Medicine

## 2023-04-23 NOTE — Telephone Encounter (Signed)
 LOV 12/14/22 Video visit  NOV nothing scheduled  Last refill 11/27/22 #30 w/ 3 refills

## 2023-04-24 NOTE — Telephone Encounter (Signed)
 ERx

## 2023-06-25 DIAGNOSIS — Z20822 Contact with and (suspected) exposure to covid-19: Secondary | ICD-10-CM | POA: Diagnosis not present

## 2023-06-25 DIAGNOSIS — G9331 Postviral fatigue syndrome: Secondary | ICD-10-CM | POA: Diagnosis not present

## 2023-06-25 DIAGNOSIS — B349 Viral infection, unspecified: Secondary | ICD-10-CM | POA: Diagnosis not present

## 2023-07-18 ENCOUNTER — Encounter: Payer: Self-pay | Admitting: Internal Medicine

## 2023-07-18 ENCOUNTER — Ambulatory Visit

## 2023-07-18 VITALS — Ht 70.0 in | Wt 222.0 lb

## 2023-07-18 DIAGNOSIS — Z860100 Personal history of colon polyps, unspecified: Secondary | ICD-10-CM

## 2023-07-18 MED ORDER — NA SULFATE-K SULFATE-MG SULF 17.5-3.13-1.6 GM/177ML PO SOLN: 1.0000 | Freq: Once | ORAL | 0 refills | Status: AC

## 2023-07-18 NOTE — Progress Notes (Signed)
 Pre visit completed over telephone. Instructions through MyChart   No egg or soy allergy known to patient  No issues known to pt with past sedation with any surgeries or procedures Patient denies ever being told they had issues or difficulty with intubation  No FH of Malignant Hyperthermia Pt is not on diet pills Pt is not on  home 02  Pt is not on blood thinners  Pt denies issues with constipation  No A fib or A flutter Have any cardiac testing pending--NO Pt instructed to use [email or web address] or GoodRx for a price reduction on prep

## 2023-07-23 HISTORY — PX: COLONOSCOPY: SHX174

## 2023-08-09 ENCOUNTER — Encounter: Payer: Self-pay | Admitting: Internal Medicine

## 2023-08-09 ENCOUNTER — Ambulatory Visit (AMBULATORY_SURGERY_CENTER): Admitting: Internal Medicine

## 2023-08-09 VITALS — BP 114/74 | HR 73 | Temp 99.0°F | Resp 12 | Ht 69.0 in | Wt 222.0 lb

## 2023-08-09 DIAGNOSIS — K635 Polyp of colon: Secondary | ICD-10-CM | POA: Diagnosis not present

## 2023-08-09 DIAGNOSIS — D120 Benign neoplasm of cecum: Secondary | ICD-10-CM

## 2023-08-09 DIAGNOSIS — D122 Benign neoplasm of ascending colon: Secondary | ICD-10-CM

## 2023-08-09 DIAGNOSIS — K648 Other hemorrhoids: Secondary | ICD-10-CM

## 2023-08-09 DIAGNOSIS — Z1211 Encounter for screening for malignant neoplasm of colon: Secondary | ICD-10-CM | POA: Diagnosis not present

## 2023-08-09 DIAGNOSIS — Z860100 Personal history of colon polyps, unspecified: Secondary | ICD-10-CM

## 2023-08-09 DIAGNOSIS — K5730 Diverticulosis of large intestine without perforation or abscess without bleeding: Secondary | ICD-10-CM

## 2023-08-09 DIAGNOSIS — D123 Benign neoplasm of transverse colon: Secondary | ICD-10-CM

## 2023-08-09 MED ORDER — SODIUM CHLORIDE 0.9 % IV SOLN: 500.0000 mL | Freq: Once | INTRAVENOUS | Status: DC

## 2023-08-09 NOTE — Op Note (Signed)
 [facility] Patient Name: Daniel May Procedure Date: 08/09/2023 3:28 PM MRN: [id number] Endoscopist: Rosario Estefana Kidney , , [id number] Age: 58 Referring MD:  Date of Birth: Sep 20, 1965 Gender: Male Account #: [id number] Procedure:                Colonoscopy Indications:              High risk colon cancer surveillance: Personal                            history of colonic polyps Medicines:                Monitored Anesthesia Care Procedure:                Pre-Anesthesia Assessment:                           - Prior to the procedure, a History and Physical                            was performed, and patient medications and                            allergies were reviewed. The patient's tolerance of                            previous anesthesia was also reviewed. The risks                            and benefits of the procedure and the sedation                            options and risks were discussed with the patient.                            All questions were answered, and informed consent                            was obtained. Prior Anticoagulants: The patient has                            taken no anticoagulant or antiplatelet agents. ASA                            Grade Assessment: III - A patient with severe                            systemic disease. After reviewing the risks and                            benefits, the patient was deemed in satisfactory                            condition to undergo the procedure.  After obtaining informed consent, the colonoscope                            was passed under direct vision. Throughout the                            procedure, the patient's blood pressure, pulse, and                            oxygen saturations were monitored continuously. The                            Olympus CF-HQ190L ([id number]) Colonoscope was                            introduced through the anus and  advanced to the the                            terminal ileum. The colonoscopy was performed                            without difficulty. The patient tolerated the                            procedure well. The quality of the bowel                            preparation was excellent. The terminal ileum,                            ileocecal valve, appendiceal orifice, and rectum                            were photographed. Scope In: 3:40:14 PM Scope Out: 4:06:01 PM Scope Withdrawal Time: 0 hours 23 minutes 36 seconds  Total Procedure Duration: 0 hours 25 minutes 47 seconds  Findings:                 The terminal ileum appeared normal.                           Six sessile polyps were found in the transverse                            colon, ascending colon and cecum. The polyps were 3                            to 12 mm in size. These polyps were removed with a                            cold snare. Resection and retrieval were complete.                           A 3 mm polyp was found in the sigmoid colon. The  polyp was sessile. The polyp was removed with a                            cold snare. Resection and retrieval were complete.                           Multiple diverticula were found in the sigmoid                            colon and descending colon.                           Non-bleeding internal hemorrhoids were found during                            retroflexion. Complications:            No immediate complications. Estimated Blood Loss:     Estimated blood loss was minimal. Impression:               - The examined portion of the ileum was normal.                           - Six 3 to 12 mm polyps in the transverse colon, in                            the ascending colon and in the cecum, removed with                            a cold snare. Resected and retrieved.                           - One 3 mm polyp in the sigmoid colon, removed with                             a cold snare. Resected and retrieved.                           - Diverticulosis in the sigmoid colon and in the                            descending colon.                           - Non-bleeding internal hemorrhoids. Recommendation:           - Discharge patient to home (with escort).                           - Await pathology results.                           - The findings and recommendations were discussed                            with the  patient. Dr Estefana Federico Rosario Estefana Federico,  08/09/2023 4:15:28 PM

## 2023-08-09 NOTE — Progress Notes (Signed)
 Pt sedate, gd SR's, VSS, report to RN

## 2023-08-09 NOTE — Patient Instructions (Addendum)
 Await pathology results.  Please read over handout Continue your normal medications   YOU HAD AN ENDOSCOPIC PROCEDURE TODAY AT THE [facility]:   Refer to the procedure report that was given to you for any specific questions about what was found during the examination.  If the procedure report does not answer your questions, please call your gastroenterologist to clarify.  If you requested that your care partner not be given the details of your procedure findings, then the procedure report has been included in a sealed envelope for you to review at your convenience later.  YOU SHOULD EXPECT: Some feelings of bloating in the abdomen. Passage of more gas than usual.  Walking can help get rid of the air that was put into your GI tract during the procedure and reduce the bloating. If you had a lower endoscopy (such as a colonoscopy or flexible sigmoidoscopy) you may notice spotting of blood in your stool or on the toilet paper. If you underwent a bowel prep for your procedure, you may not have a normal bowel movement for a few days.  Please Note:  You might notice some irritation and congestion in your nose or some drainage.  This is from the oxygen used during your procedure.  There is no need for concern and it should clear up in a day or so.  SYMPTOMS TO REPORT IMMEDIATELY:  Following lower endoscopy (colonoscopy or flexible sigmoidoscopy):  Excessive amounts of blood in the stool  Significant tenderness or worsening of abdominal pains  Swelling of the abdomen that is new, acute  Fever of 100F or higher For urgent or emergent issues, a gastroenterologist can be reached at any hour by calling [phone]. Do not use MyChart messaging for urgent concerns.    DIET:  We do recommend a small meal at first, but then you may proceed to your regular diet.  Drink plenty of fluids but you should avoid alcoholic beverages for 24 hours.  ACTIVITY:  You should plan to take it easy for the  rest of today and you should NOT DRIVE or use heavy machinery until tomorrow (because of the sedation medicines used during the test).    FOLLOW UP: Our staff will call the number listed on your records the next business day following your procedure.  We will call around 7:15- 8:00 am to check on you and address any questions or concerns that you may have regarding the information given to you following your procedure. If we do not reach you, we will leave a message.     If any biopsies were taken you will be contacted by phone or by letter within the next 1-3 weeks.  Please call us  at [phone] if you have not heard about the biopsies in 3 weeks.    SIGNATURES/CONFIDENTIALITY: You and/or your care partner have signed paperwork which will be entered into your electronic medical record.  These signatures attest to the fact that that the information above on your After Visit Summary has been reviewed and is understood.  Full responsibility of the confidentiality of this discharge information lies with you and/or your care-partner.

## 2023-08-09 NOTE — Progress Notes (Signed)
 Pt's states no medical or surgical changes since previsit or office visit.

## 2023-08-09 NOTE — Progress Notes (Signed)
 GASTROENTEROLOGY PROCEDURE H&P NOTE   Primary Care Physician: Rilla Baller, MD    Reason for Procedure:   History of colon polyps  Plan:    Colonoscopy  Patient is appropriate for endoscopic procedure(s) in the ambulatory ([facility]) setting.  The nature of the procedure, as well as the risks, benefits, and alternatives were carefully and thoroughly reviewed with the patient. Ample time for discussion and questions allowed. The patient understood, was satisfied, and agreed to proceed.     HPI: Daniel May is a 58 y.o. male who presents for colonoscopy for history of colon polyps. Denies blood in stools, changes in bowel habits, or unintentional weight loss. Denies family history of colon cancer.  Colonoscopy 06/04/17: - Two 5 to 8 mm polyps in the sigmoid colon, removed with a cold snare. Resected and retrieved. - Mild diverticulosis in the sigmoid colon. - Internal hemorrhoids. - The examination was otherwise normal on direct and retroflexion views. Path: Surgical [P], sigmoid, polyp (2) - TUBULAR ADENOMA (THREE). - NO HIGH GRADE DYSPLASIA OR MALIGNANCY.    Past Medical History:  Diagnosis Date   Allergy    Anxiety    Closed fracture of tibia and fibula 1980's   right   COPD (chronic obstructive pulmonary disease) (HCC)    Depression    H/O seasonal allergies    History of alcohol abuse    Quit drinking 09/2015 (used antabuse ), relapsed [facility] 09/2016, sober since   History of chicken pox    HLD (hyperlipidemia)    HTN (hypertension)    Smoker    Substance abuse (HCC)    alcohol    Past Surgical History:  Procedure Laterality Date   ARTERY BIOPSY Right 10/26/2019   Procedure: BIOPSY TEMPORAL ARTERY;  Surgeon: Karis Clunes, MD;  Location: [facility];  Service: ENT;  Laterality: Right;   COLONOSCOPY  05/2017   3 TA, mild diverticulosis, int hem, rpt 5 yrs ([facility])   Fracture repair of right leg fracture  1987    Prior to Admission medications    Medication Sig Start Date End Date Taking? Authorizing Provider  allopurinol  (ZYLOPRIM ) 100 MG tablet Take 2 tablets (200 mg total) by mouth daily. 11/27/22  Yes Rilla Baller, MD  folic acid  (FOLVITE ) 1 MG tablet 1 tablet Orally Once a day; Duration: 30 day(s) 12/08/19  Yes [provider]  losartan  (COZAAR ) 50 MG tablet Take 1 tablet (50 mg total) by mouth daily. 11/27/22  Yes Rilla Baller, MD  metoprolol  succinate (TOPROL -XL) 25 MG 24 hr tablet Take 1 tablet (25 mg total) by mouth daily. 11/27/22  Yes Rilla Baller, MD  Multiple Vitamins-Minerals (MULTIVITAMIN ADULT PO) Take by mouth.   Yes [provider]  Omega-3 Fatty Acids (FISH OIL) 1200 MG CAPS Take 2 capsules by mouth daily.   Yes [provider]  tamsulosin  (FLOMAX ) 0.4 MG CAPS capsule Take 1 capsule by mouth once daily 04/24/23  Yes Rilla Baller, MD  vitamin C (ASCORBIC ACID) 500 MG tablet Take 500 mg by mouth daily.   Yes [provider]  albuterol  (VENTOLIN  HFA) 108 (90 Base) MCG/ACT inhaler Inhale 2 puffs into the lungs every 6 (six) hours as needed for wheezing or shortness of breath. 11/27/22   Rilla Baller, MD    Current Outpatient Medications  Medication Sig Dispense Refill   allopurinol  (ZYLOPRIM ) 100 MG tablet Take 2 tablets (200 mg total) by mouth daily. 180 tablet 4   folic acid  (FOLVITE ) 1 MG tablet 1 tablet  Orally Once a day; Duration: 30 day(s)     losartan  (COZAAR ) 50 MG tablet Take 1 tablet (50 mg total) by mouth daily. 90 tablet 4   metoprolol  succinate (TOPROL -XL) 25 MG 24 hr tablet Take 1 tablet (25 mg total) by mouth daily. 90 tablet 4   Multiple Vitamins-Minerals (MULTIVITAMIN ADULT PO) Take by mouth.     Omega-3 Fatty Acids (FISH OIL) 1200 MG CAPS Take 2 capsules by mouth daily.     tamsulosin  (FLOMAX ) 0.4 MG CAPS capsule Take 1 capsule by mouth once daily 30 capsule 6   vitamin C (ASCORBIC ACID) 500 MG tablet Take 500 mg by mouth daily.     albuterol   (VENTOLIN  HFA) 108 (90 Base) MCG/ACT inhaler Inhale 2 puffs into the lungs every 6 (six) hours as needed for wheezing or shortness of breath. 18 g 3   Current Facility-Administered Medications  Medication Dose Route Frequency Provider Last Rate Last Admin   0.9 %  sodium chloride  infusion  500 mL Intravenous Once Federico Rosario BROCKS, MD        Allergies as of 08/09/2023   (No Known Allergies)    Family History  Problem Relation Age of Onset   Heart disease Mother        CHF   Cancer Father 46       lung (smoker)   Diabetes Sister    Diabetes Brother    Diabetes Paternal Aunt    Stroke Neg Hx    CAD Neg Hx    Colon cancer Neg Hx    Colon polyps Neg Hx     Social History   Socioeconomic History   Marital status: Married    Spouse name: Not on file   Number of children: Not on file   Years of education: Not on file   Highest education level: 12th grade  Occupational History   Not on file  Tobacco Use   Smoking status: Every Day    Current packs/day: 1.00    Types: Cigarettes   Smokeless tobacco: Never  Vaping Use   Vaping status: Every Day  Substance and Sexual Activity   Alcohol use: No    Comment: hx ETOH   Drug use: No   Sexual activity: Not on file  Other Topics Concern   Not on file  Social History Narrative   Lives with wife, 1 dog and 3 cats   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    Edu: HS   Activity: walks dog   Diet: good water, fruits/vegetables daily   Social Drivers of Corporate investment banker Strain: Low Risk  (11/26/2022)   Overall Financial Resource Strain (CARDIA)    Difficulty of Paying Living Expenses: Not hard at all  Food Insecurity: No Food Insecurity (11/26/2022)   Hunger Vital Sign    Worried About Running Out of Food in the Last Year: Never true    Ran Out of Food in the Last Year: Never true  Transportation Needs: No Transportation Needs (11/26/2022)   PRAPARE - Administrator, Civil Service (Medical): No    Lack of  Transportation (Non-Medical): No  Physical Activity: Unknown (11/26/2022)   Exercise Vital Sign    Days of Exercise per Week: 0 days    Minutes of Exercise per Session: Not on file  Stress: No Stress Concern Present (11/26/2022)   [employer] of Occupational Health - Occupational Stress Questionnaire    Feeling of Stress : Only a little  Social Connections: Socially Isolated (11/26/2022)   Social Connection and Isolation Panel    Frequency of Communication with Friends and Family: Once a week    Frequency of Social Gatherings with Friends and Family: Once a week    Attends Religious Services: Never    Database administrator or Organizations: No    Attends Engineer, structural: Not on file    Marital Status: Married  Intimate Partner Violence: Unknown (04/27/2021)   Received from [facility]   HITS    Physically Hurt: Not on file    Insult or Talk Down To: Not on file    Threaten Physical Harm: Not on file    Scream or Curse: Not on file    Physical Exam: Vital signs in last 24 hours: BP 123/63   Pulse 82   Temp 99 F (37.2 C) (Temporal)   Ht 5' 9 (1.753 m)   Wt 222 lb (100.7 kg)   SpO2 97%   BMI 32.78 kg/m  GEN: NAD EYE: Sclerae anicteric ENT: MMM CV: Non-tachycardic Pulm: No increased work of breathing GI: Soft, NT/ND NEURO:  Alert & Oriented   Estefana Kidney, MD [facility]  08/09/2023 3:26 PM

## 2023-08-12 ENCOUNTER — Telehealth: Payer: Self-pay | Admitting: *Deleted

## 2023-08-12 ENCOUNTER — Encounter: Payer: Self-pay | Admitting: Family Medicine

## 2023-08-12 NOTE — Telephone Encounter (Signed)
  Follow up Call-     08/09/2023    2:50 PM  Call back number  Post procedure Call Back phone  # [phone]  Permission to leave phone message Yes     Patient questions:   Message left to call us  if necessary.

## 2023-08-14 ENCOUNTER — Ambulatory Visit: Payer: Self-pay | Admitting: Internal Medicine

## 2023-08-14 LAB — SURGICAL PATHOLOGY

## 2023-11-07 ENCOUNTER — Other Ambulatory Visit: Payer: Self-pay | Admitting: Family Medicine

## 2023-11-07 DIAGNOSIS — N401 Enlarged prostate with lower urinary tract symptoms: Secondary | ICD-10-CM

## 2023-11-07 NOTE — Telephone Encounter (Signed)
 E-scribed refill.  Pls schedule CPE and fasting labs for additional refills.

## 2023-12-14 ENCOUNTER — Encounter: Payer: Self-pay | Admitting: Family Medicine

## 2023-12-14 ENCOUNTER — Other Ambulatory Visit: Payer: Self-pay | Admitting: Family Medicine

## 2023-12-14 DIAGNOSIS — M1A09X0 Idiopathic chronic gout, multiple sites, without tophus (tophi): Secondary | ICD-10-CM

## 2023-12-14 DIAGNOSIS — N401 Enlarged prostate with lower urinary tract symptoms: Secondary | ICD-10-CM

## 2023-12-14 DIAGNOSIS — R7401 Elevation of levels of liver transaminase levels: Secondary | ICD-10-CM

## 2023-12-14 DIAGNOSIS — I10 Essential (primary) hypertension: Secondary | ICD-10-CM

## 2023-12-14 DIAGNOSIS — M1380 Other specified arthritis, unspecified site: Secondary | ICD-10-CM

## 2023-12-14 DIAGNOSIS — E785 Hyperlipidemia, unspecified: Secondary | ICD-10-CM

## 2023-12-16 ENCOUNTER — Other Ambulatory Visit (INDEPENDENT_AMBULATORY_CARE_PROVIDER_SITE_OTHER)

## 2023-12-16 DIAGNOSIS — E785 Hyperlipidemia, unspecified: Secondary | ICD-10-CM | POA: Diagnosis not present

## 2023-12-16 DIAGNOSIS — R3911 Hesitancy of micturition: Secondary | ICD-10-CM | POA: Diagnosis not present

## 2023-12-16 DIAGNOSIS — N401 Enlarged prostate with lower urinary tract symptoms: Secondary | ICD-10-CM | POA: Diagnosis not present

## 2023-12-16 DIAGNOSIS — M1380 Other specified arthritis, unspecified site: Secondary | ICD-10-CM

## 2023-12-16 DIAGNOSIS — M1A09X0 Idiopathic chronic gout, multiple sites, without tophus (tophi): Secondary | ICD-10-CM

## 2023-12-16 DIAGNOSIS — R7401 Elevation of levels of liver transaminase levels: Secondary | ICD-10-CM | POA: Diagnosis not present

## 2023-12-16 LAB — COMPREHENSIVE METABOLIC PANEL WITH GFR
ALT: 16 U/L (ref 0–53)
AST: 16 U/L (ref 0–37)
Albumin: 4.4 g/dL (ref 3.5–5.2)
Alkaline Phosphatase: 63 U/L (ref 39–117)
BUN: 10 mg/dL (ref 6–23)
CO2: 30 meq/L (ref 19–32)
Calcium: 9 mg/dL (ref 8.4–10.5)
Chloride: 105 meq/L (ref 96–112)
Creatinine, Ser: 0.89 mg/dL (ref 0.40–1.50)
GFR: 95.56 mL/min (ref >60.00)
Glucose, Bld: 102 mg/dL — ABNORMAL HIGH (ref 70–99)
Potassium: 4.3 meq/L (ref 3.5–5.1)
Sodium: 142 meq/L (ref 135–145)
Total Bilirubin: 0.5 mg/dL (ref 0.2–1.2)
Total Protein: 6.8 g/dL (ref 6.0–8.3)

## 2023-12-16 LAB — LIPID PANEL
Cholesterol: 201 mg/dL — ABNORMAL HIGH (ref 0–200)
HDL: 42.9 mg/dL (ref >39.00)
LDL Cholesterol: 143 mg/dL — ABNORMAL HIGH (ref 0–99)
NonHDL: 158.24
Total CHOL/HDL Ratio: 5
Triglycerides: 74 mg/dL (ref 0.0–149.0)
VLDL: 14.8 mg/dL (ref 0.0–40.0)

## 2023-12-16 LAB — SEDIMENTATION RATE: Sed Rate: 6 mm/h (ref 0–20)

## 2023-12-16 LAB — PSA: PSA: 1.11 ng/mL (ref 0.10–4.00)

## 2023-12-16 LAB — CBC WITH DIFFERENTIAL/PLATELET
Basophils Absolute: 0 K/uL (ref 0.0–0.1)
Basophils Relative: 0.5 % (ref 0.0–3.0)
Eosinophils Absolute: 0.2 K/uL (ref 0.0–0.7)
Eosinophils Relative: 2.9 % (ref 0.0–5.0)
HCT: 45.9 % (ref 39.0–52.0)
Hemoglobin: 15.7 g/dL (ref 13.0–17.0)
Lymphocytes Relative: 27 % (ref 12.0–46.0)
Lymphs Abs: 2.1 K/uL (ref 0.7–4.0)
MCHC: 34.2 g/dL (ref 30.0–36.0)
MCV: 89.8 fl (ref 78.0–100.0)
Monocytes Absolute: 0.7 K/uL (ref 0.1–1.0)
Monocytes Relative: 9.4 % (ref 3.0–12.0)
Neutro Abs: 4.6 K/uL (ref 1.4–7.7)
Neutrophils Relative %: 60.2 % (ref 43.0–77.0)
Platelets: 195 K/uL (ref 150.0–400.0)
RBC: 5.12 Mil/uL (ref 4.22–5.81)
RDW: 14.3 % (ref 11.5–15.5)
WBC: 7.7 K/uL (ref 4.0–10.5)

## 2023-12-16 LAB — C-REACTIVE PROTEIN: CRP: <0.5 mg/dL (ref 0.5–20.0)

## 2023-12-16 LAB — URIC ACID: Uric Acid, Serum: 6 mg/dL (ref 4.0–7.8)

## 2023-12-21 ENCOUNTER — Ambulatory Visit: Payer: Self-pay | Admitting: Family Medicine

## 2023-12-23 ENCOUNTER — Encounter: Payer: Self-pay | Admitting: Family Medicine

## 2023-12-23 ENCOUNTER — Ambulatory Visit (INDEPENDENT_AMBULATORY_CARE_PROVIDER_SITE_OTHER): Admitting: Family Medicine

## 2023-12-23 VITALS — BP 130/80 | HR 81 | Temp 98.6°F | Ht 69.5 in | Wt 229.0 lb

## 2023-12-23 DIAGNOSIS — M1A09X0 Idiopathic chronic gout, multiple sites, without tophus (tophi): Secondary | ICD-10-CM

## 2023-12-23 DIAGNOSIS — R3911 Hesitancy of micturition: Secondary | ICD-10-CM

## 2023-12-23 DIAGNOSIS — N401 Enlarged prostate with lower urinary tract symptoms: Secondary | ICD-10-CM

## 2023-12-23 DIAGNOSIS — I10 Essential (primary) hypertension: Secondary | ICD-10-CM

## 2023-12-23 DIAGNOSIS — Z0000 Encounter for general adult medical examination without abnormal findings: Secondary | ICD-10-CM | POA: Diagnosis not present

## 2023-12-23 DIAGNOSIS — E785 Hyperlipidemia, unspecified: Secondary | ICD-10-CM

## 2023-12-23 DIAGNOSIS — F17200 Nicotine dependence, unspecified, uncomplicated: Secondary | ICD-10-CM

## 2023-12-23 DIAGNOSIS — E66811 Obesity, class 1: Secondary | ICD-10-CM

## 2023-12-23 MED ORDER — TAMSULOSIN HCL 0.4 MG PO CAPS: 0.4000 mg | ORAL_CAPSULE | Freq: Every day | ORAL | 3 refills | Status: AC

## 2023-12-23 MED ORDER — METOPROLOL SUCCINATE ER 25 MG PO TB24: 25.0000 mg | ORAL_TABLET | Freq: Every day | ORAL | 3 refills | Status: DC

## 2023-12-23 MED ORDER — ALBUTEROL SULFATE HFA 108 (90 BASE) MCG/ACT IN AERS: 2.0000 | INHALATION_SPRAY | Freq: Four times a day (QID) | RESPIRATORY_TRACT | 1 refills | Status: AC | PRN

## 2023-12-23 MED ORDER — LOSARTAN POTASSIUM 50 MG PO TABS: 50.0000 mg | ORAL_TABLET | Freq: Every day | ORAL | 3 refills | Status: DC

## 2023-12-23 MED ORDER — ALLOPURINOL 100 MG PO TABS: 200.0000 mg | ORAL_TABLET | Freq: Every day | ORAL | 3 refills | Status: DC

## 2023-12-23 NOTE — Assessment & Plan Note (Signed)
 Chronic, stable - continue flomax .

## 2023-12-23 NOTE — Assessment & Plan Note (Addendum)
 Chronic only on fish oil. Fmhx CAD (mother). Reviewed diet choices to improve cholesterol control. Discussed heart CT with calcium score - he will let me know if interested in ordering.  The 10-year ASCVD risk score (Arnett DK, et al., 2019) is: 14.9%   Values used to calculate the score:     Age: 58 years     Clincally relevant sex: Male     Is Non-Hispanic African American: No     Diabetic: No     Tobacco smoker: Yes     Systolic Blood Pressure: 130 mmHg     Is BP treated: Yes     HDL Cholesterol: 42.9 mg/dL     Total Cholesterol: 201 mg/dL

## 2023-12-23 NOTE — Patient Instructions (Addendum)
 I will refer you to lung cancer screening.  Let me know if interested in ordering heart CT scan with calcium score for further evaluation of heart risk (screening) ($100) - handout provided.  You are doing well today Return as needed or in 1 year for next physical

## 2023-12-23 NOTE — Assessment & Plan Note (Signed)
 Chronic, stable. Continue current regimen.

## 2023-12-23 NOTE — Assessment & Plan Note (Signed)
 Stable period on allopurinol 200mg  daily without recent gout flares

## 2023-12-23 NOTE — Progress Notes (Signed)
 Ph: [phone] Fax: [phone]   Patient ID: Daniel May, male    DOB: 17-Jun-1965, 58 y.o.   MRN: [id number]  This visit was conducted in person.  BP 130/80   Pulse 81   Temp 98.6 F (37 C) (Oral)   Ht 5' 9.5 (1.765 m)   Wt 229 lb (103.9 kg)   SpO2 98%   BMI 33.33 kg/m    CC: CPE Subjective:   HPI: Daniel May is a 58 y.o. male presenting on 12/23/2023 for Annual Exam   HTN - well managed on current regimen.   Preventative: COLONOSCOPY 07/2023 - 7 polyps (TA with low grade dysplasia, SSP, HP), diverticulosis, rpt 3 yrs Renaye)  COLONOSCOPY 05/2017 - 3 TA, mild diverticulosis, int hem, rpt 5 yrs Oma) Prostate screening - yearly PSA. No fmhx prostate cancer. Nocturia x1.  Lung cancer screening - 30+ PY hx. Interested - referral placed.  Flu shot - doesn't get  COVID vaccine - did not get Tetanus - 2016 Shingrix  - 08/2021, 11/2022 Seat belt use discussed.  Sunscreen use discussed. No changing moles.  Smoking - 1/2 ppd. Vaping > smoking.  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 Interim medical history since our last visit reviewed. Allergies and medications reviewed and updated. Outpatient Medications Prior to Visit  Medication Sig Dispense Refill   folic acid  (FOLVITE ) 1 MG tablet 1 tablet Orally Once a day; Duration: 30 day(s)     Multiple Vitamins-Minerals (MULTIVITAMIN ADULT PO) Take by mouth.     Omega-3 Fatty Acids (FISH OIL) 1200 MG CAPS Take 2 capsules by mouth daily.     vitamin C (ASCORBIC ACID) 500 MG tablet Take 500 mg by mouth daily.     albuterol   (VENTOLIN  HFA) 108 (90 Base) MCG/ACT inhaler Inhale 2 puffs into the lungs every 6 (six) hours as needed for wheezing or shortness of breath. 18 g 3   allopurinol  (ZYLOPRIM ) 100 MG tablet Take 2 tablets by mouth once daily 60 tablet 0   losartan  (COZAAR ) 50 MG tablet Take 1 tablet by mouth once daily 30 tablet 0   metoprolol  succinate (TOPROL -XL) 25 MG 24 hr tablet Take 1 tablet by mouth once daily 30 tablet 0   tamsulosin  (FLOMAX ) 0.4 MG CAPS capsule Take 1 capsule by mouth once daily 30 capsule 2   No facility-administered medications prior to visit.     Per HPI unless specifically indicated in ROS section below Review of Systems  Constitutional:  Negative for activity change, appetite change, chills, fatigue, fever and unexpected weight change.  HENT:  Negative for hearing loss.   Eyes:  Negative for visual disturbance.  Respiratory:  Negative for cough, chest tightness, shortness of breath and wheezing.   Cardiovascular:  Negative for chest pain, palpitations and leg swelling.  Gastrointestinal:  Negative for abdominal distention, abdominal pain, blood in stool, constipation, diarrhea, nausea and vomiting.  Genitourinary:  Negative for difficulty urinating and hematuria.  Musculoskeletal:  Negative for arthralgias, myalgias and neck pain.  Skin:  Negative for rash.  Neurological:  Negative for dizziness, seizures, syncope and headaches.  Hematological:  Negative for adenopathy. Does not bruise/bleed easily.  Psychiatric/Behavioral:  Negative for dysphoric mood. The patient is not nervous/anxious.     Objective:  BP 130/80   Pulse 81   Temp 98.6 F (37 C) (Oral)   Ht 5' 9.5 (1.765 m)   Wt 229 lb (103.9 kg)   SpO2 98%   BMI 33.33 kg/m   Wt Readings from Last 3 Encounters:  12/23/23 229 lb (103.9 kg)  08/09/23 222 lb (100.7 kg)  07/18/23 222 lb (100.7 kg)      Physical Exam Vitals and nursing note reviewed.  Constitutional:      General: He is not in acute distress.     Appearance: Normal appearance. He is well-developed. He is not ill-appearing.  HENT:     Head: Normocephalic and atraumatic.     Right Ear: Hearing, tympanic membrane, ear canal and external ear normal.     Left Ear: Hearing, tympanic membrane, ear canal and external ear normal.     Mouth/Throat:     Mouth: Mucous membranes are moist.     Pharynx: Oropharynx is clear. No oropharyngeal exudate or posterior oropharyngeal erythema.  Eyes:     General: No scleral icterus.    Extraocular Movements: Extraocular movements intact.     Conjunctiva/sclera: Conjunctivae normal.     Pupils: Pupils are equal, round, and reactive to light.  Neck:     Thyroid : No thyroid  mass or thyromegaly.  Cardiovascular:     Rate and Rhythm: Normal rate and regular rhythm.     Pulses: Normal pulses.          Radial pulses are 2+ on the right side and 2+ on the left side.     Heart sounds: Normal heart sounds. No murmur heard. Pulmonary:     Effort: Pulmonary effort is normal. No respiratory distress.     Breath sounds: Normal breath sounds. No wheezing, rhonchi or rales.  Abdominal:     General: Bowel sounds are normal. There is no distension.     Palpations: Abdomen is soft. There is no mass.     Tenderness: There is no abdominal tenderness. There is no guarding or rebound.     Hernia: No hernia is present.  Musculoskeletal:        General: Normal range of motion.     Cervical back: Normal range of motion and neck supple.     Right lower leg: No edema.     Left lower leg: No edema.  Lymphadenopathy:     Cervical: No cervical adenopathy.  Skin:    General: Skin is warm and dry.     Findings: No rash.  Neurological:     General: No focal deficit present.     Mental Status: He is alert and oriented to person, place, and time.  Psychiatric:        Mood and Affect: Mood normal.        Behavior: Behavior normal.        Thought Content: Thought content normal.        Judgment: Judgment normal.        Results for orders placed or performed in visit on 12/16/23  C-reactive protein   Collection Time: 12/16/23  7:31 AM  Result Value Ref Range   CRP <0.5 0.5 - 20.0 mg/dL  Sedimentation rate   Collection Time: 12/16/23  7:31 AM  Result Value Ref Range   Sed Rate 6 0 - 20 mm/hr  CBC with Differential/Platelet  Collection Time: 12/16/23  7:31 AM  Result Value Ref Range   WBC 7.7 4.0 - 10.5 K/uL   RBC 5.12 4.22 - 5.81 Mil/uL   Hemoglobin 15.7 13.0 - 17.0 g/dL   HCT 54.0 60.9 - 47.9 %   MCV 89.8 78.0 - 100.0 fl   MCHC 34.2 30.0 - 36.0 g/dL   RDW 85.6 88.4 - 84.4 %   Platelets 195.0 150.0 - 400.0 K/uL   Neutrophils Relative % 60.2 43.0 - 77.0 %   Lymphocytes Relative 27.0 12.0 - 46.0 %   Monocytes Relative 9.4 3.0 - 12.0 %   Eosinophils Relative 2.9 0.0 - 5.0 %   Basophils Relative 0.5 0.0 - 3.0 %   Neutro Abs 4.6 1.4 - 7.7 K/uL   Lymphs Abs 2.1 0.7 - 4.0 K/uL   Monocytes Absolute 0.7 0.1 - 1.0 K/uL   Eosinophils Absolute 0.2 0.0 - 0.7 K/uL   Basophils Absolute 0.0 0.0 - 0.1 K/uL  Uric acid   Collection Time: 12/16/23  7:31 AM  Result Value Ref Range   Uric Acid, Serum 6.0 4.0 - 7.8 mg/dL  PSA   Collection Time: 12/16/23  7:31 AM  Result Value Ref Range   PSA 1.11 0.10 - 4.00 ng/mL  Comprehensive metabolic panel with GFR   Collection Time: 12/16/23  7:31 AM  Result Value Ref Range   Sodium 142 135 - 145 mEq/L   Potassium 4.3 3.5 - 5.1 mEq/L   Chloride 105 96 - 112 mEq/L   CO2 30 19 - 32 mEq/L   Glucose, Bld 102 (H) 70 - 99 mg/dL   BUN 10 6 - 23 mg/dL   Creatinine, Ser 9.10 0.40 - 1.50 mg/dL   Total Bilirubin 0.5 0.2 - 1.2 mg/dL   Alkaline Phosphatase 63 39 - 117 U/L   AST 16 0 - 37 U/L   ALT 16 0 - 53 U/L   Total Protein 6.8 6.0 - 8.3 g/dL   Albumin 4.4 3.5 - 5.2 g/dL   GFR 04.43 >39.99 mL/min   Calcium 9.0 8.4 - 10.5 mg/dL  Lipid panel   Collection Time: 12/16/23  7:31 AM  Result Value Ref Range   Cholesterol 201 (H) 0 - 200 mg/dL   Triglycerides 25.9 0.0 -  149.0 mg/dL   HDL 57.09 >60.99 mg/dL   VLDL 85.1 0.0 - 59.9 mg/dL   LDL Cholesterol 856 (H) 0 - 99 mg/dL   Total CHOL/HDL Ratio 5    NonHDL 158.24     Assessment & Plan:   Problem List Items Addressed This Visit     Health maintenance examination - Primary (Chronic)   Preventative protocols reviewed and updated unless pt declined. Discussed healthy diet and lifestyle.       Hypertension   Chronic, stable. Continue current regimen       Relevant Medications   losartan  (COZAAR ) 50 MG tablet   metoprolol  succinate (TOPROL -XL) 25 MG 24 hr tablet   Dyslipidemia   Chronic only on fish oil. Fmhx CAD (mother). Reviewed diet choices to improve cholesterol control. Discussed heart CT with calcium score - he will let me know if interested in ordering.  The 10-year ASCVD risk score (Arnett DK, et al., 2019) is: 14.9%   Values used to calculate the score:     Age: 71 years     Clincally relevant sex: Male     Is Non-Hispanic African American: No     Diabetic: No     Tobacco smoker: Yes  Systolic Blood Pressure: 130 mmHg     Is BP treated: Yes     HDL Cholesterol: 42.9 mg/dL     Total Cholesterol: 201 mg/dL       Smoker   Encouraged full cessation. Re-referred to lung cancer screening program.       Relevant Orders   Ambulatory Referral Lung Cancer Screening [facility]   Gout   Stable period on allopurinol  200mg  daily without recent gout flares      Relevant Medications   allopurinol  (ZYLOPRIM ) 100 MG tablet   Obesity, Class I, BMI 30-34.9   Continue to encourage healthy diet and lifestyle choices to effect sustainable weight loss.       BPH (benign prostatic hyperplasia)   Chronic, stable - continue flomax .      Relevant Medications   tamsulosin  (FLOMAX ) 0.4 MG CAPS capsule     Meds ordered this encounter  Medications   albuterol  (VENTOLIN  HFA) 108 (90 Base) MCG/ACT inhaler    Sig: Inhale 2 puffs into the lungs every 6 (six) hours as needed for  wheezing or shortness of breath.    Dispense:  18 g    Refill:  1   allopurinol  (ZYLOPRIM ) 100 MG tablet    Sig: Take 2 tablets (200 mg total) by mouth daily.    Dispense:  180 tablet    Refill:  3   losartan  (COZAAR ) 50 MG tablet    Sig: Take 1 tablet (50 mg total) by mouth daily.    Dispense:  90 tablet    Refill:  3   metoprolol  succinate (TOPROL -XL) 25 MG 24 hr tablet    Sig: Take 1 tablet (25 mg total) by mouth daily.    Dispense:  90 tablet    Refill:  3   tamsulosin  (FLOMAX ) 0.4 MG CAPS capsule    Sig: Take 1 capsule (0.4 mg total) by mouth daily.    Dispense:  90 capsule    Refill:  3    Orders Placed This Encounter  Procedures   Ambulatory Referral Lung Cancer Screening [facility]    Referral Priority:   Routine    Referral Type:   Consultation    Referral Reason:   Specialty Services Required    Number of Visits Requested:   1    Patient Instructions  I will refer you to lung cancer screening.  Let me know if interested in ordering heart CT scan with calcium score for further evaluation of heart risk (screening) ($100) - handout provided.  You are doing well today Return as needed or in 1 year for next physical   Follow up plan: Return in about 1 year (around 12/22/2024) for annual exam, prior fasting for blood work.  Anton Blas, MD

## 2023-12-23 NOTE — Assessment & Plan Note (Signed)
 Preventative protocols reviewed and updated unless pt declined. Discussed healthy diet and lifestyle.

## 2023-12-23 NOTE — Assessment & Plan Note (Addendum)
 Encouraged full cessation. Re-referred to lung cancer screening program.

## 2023-12-23 NOTE — Assessment & Plan Note (Signed)
 Continue to encourage healthy diet and lifestyle choices to effect sustainable weight loss.

## 2024-01-17 ENCOUNTER — Other Ambulatory Visit: Payer: Self-pay | Admitting: Family Medicine

## 2024-01-17 DIAGNOSIS — I10 Essential (primary) hypertension: Secondary | ICD-10-CM

## 2024-01-17 DIAGNOSIS — M1A09X0 Idiopathic chronic gout, multiple sites, without tophus (tophi): Secondary | ICD-10-CM

## 2024-01-18 NOTE — Telephone Encounter (Signed)
 1 year supply was sent to [facility] 12/23/2023.  New request received from [facility] in [place].  I have refilled but please clarify where pt wants refills sent in the future.

## 2024-01-21 ENCOUNTER — Telehealth: Payer: Self-pay | Admitting: Acute Care

## 2024-01-21 NOTE — Telephone Encounter (Signed)
 Copied from CRM [id number]. Topic: Appointments - Scheduling Inquiry for Clinic >> Jan 21, 2024  4:01 PM Daniel May wrote: Reason for CRM: Patient is requesting a call back  to schedule his [facility] CT per the referral in his chart.

## 2024-01-21 NOTE — Telephone Encounter (Signed)
 Looks like this is a referral to the program will forward over to the nurses

## 2024-01-22 NOTE — Telephone Encounter (Signed)
 Returned call. Left VM
